# Patient Record
Sex: Male | Born: 1985 | Race: Black or African American | Hispanic: No | Marital: Single | Smoking: Never smoker
Health system: Southern US, Community
[De-identification: ages and names within clinical notes are randomized; demographics above are authoritative.]

## PROBLEM LIST (undated history)

## (undated) DIAGNOSIS — I10 Essential (primary) hypertension: Secondary | ICD-10-CM

## (undated) DIAGNOSIS — F845 Asperger's syndrome: Secondary | ICD-10-CM

## (undated) DIAGNOSIS — E119 Type 2 diabetes mellitus without complications: Secondary | ICD-10-CM

---
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
## 2005-05-19 ENCOUNTER — Emergency Department (HOSPITAL_COMMUNITY): Admission: EM | Admit: 2005-05-19 | Discharge: 2005-05-19 | Payer: Self-pay | Admitting: Family Medicine

## 2008-03-11 ENCOUNTER — Emergency Department (HOSPITAL_COMMUNITY): Admission: EM | Admit: 2008-03-11 | Discharge: 2008-03-11 | Payer: Self-pay | Admitting: Emergency Medicine

## 2012-11-30 ENCOUNTER — Encounter (HOSPITAL_COMMUNITY): Payer: Self-pay | Admitting: Emergency Medicine

## 2012-11-30 ENCOUNTER — Emergency Department (HOSPITAL_COMMUNITY)
Admission: EM | Admit: 2012-11-30 | Discharge: 2012-11-30 | Disposition: A | Discharge status: Home / Self Care | Payer: Self-pay | Attending: Emergency Medicine | Admitting: Emergency Medicine

## 2012-11-30 ENCOUNTER — Emergency Department (HOSPITAL_COMMUNITY): Payer: Self-pay

## 2012-11-30 DIAGNOSIS — W010XXA Fall on same level from slipping, tripping and stumbling without subsequent striking against object, initial encounter: Secondary | ICD-10-CM | POA: Insufficient documentation

## 2012-11-30 DIAGNOSIS — Y929 Unspecified place or not applicable: Secondary | ICD-10-CM | POA: Insufficient documentation

## 2012-11-30 DIAGNOSIS — Y9302 Activity, running: Secondary | ICD-10-CM | POA: Insufficient documentation

## 2012-11-30 DIAGNOSIS — S93409A Sprain of unspecified ligament of unspecified ankle, initial encounter: Secondary | ICD-10-CM | POA: Insufficient documentation

## 2012-11-30 DIAGNOSIS — S93601A Unspecified sprain of right foot, initial encounter: Secondary | ICD-10-CM

## 2012-11-30 MED ORDER — IBUPROFEN 800 MG PO TABS
800.0000 mg | ORAL_TABLET | Freq: Once | ORAL | Status: AC
  Administered 2012-11-30: 800 mg via ORAL
  Filled 2012-11-30: qty 1

## 2012-11-30 MED ORDER — IBUPROFEN 800 MG PO TABS: 800.0000 mg | ORAL_TABLET | Freq: Three times a day (TID) | ORAL | Status: DC | PRN

## 2012-11-30 NOTE — ED Provider Notes (Signed)
CSN: [id number]     Arrival date & time 11/30/12  2010 History  This chart was scribed for non-physician practitioner working with Richardean Canal, MD, by Ardelia Mems ED Scribe. This patient was seen in room WTR9/WTR9 and the patient's care was started at 8:27 PM.   First MD Initiated Contact with Patient 11/30/12 2015     Chief Complaint  Patient presents with  . Foot Pain    The history is provided by the patient. No language interpreter was used.   HPI Comments: Jacob Price is a 27 y.o. male who presents to the Emergency Department complaining of sudden onset, gradually worsening, constant, moderate right ankle pain onset after an accidental fall that occurred yesterday. There is associated swelling to the ankle, but no bruising or obvious deformity. Pt states that he fell in his driveway- he was running from a dog, and accidentally tripped- per triage. He denies any history of prior injury to the ankle. He states that he has not taken any medications to relieve his pain, and he has not tried ice or warm compresses. He states that he is able to walk since the fall. He denies head injury, LOC, vomiting, seizures or any other pain or symptoms related to the fall. He states that he has no chronic medical conditions and takes no daily medications. He denies neck pain, back pain, fever, chills, nausea or any other symptoms. He denies any history of smoking and denies alcohol use.  History reviewed. No pertinent past medical history.  History reviewed. No pertinent past surgical history.  No family history on file.  History  Substance Use Topics  . Smoking status: Never Smoker   . Smokeless tobacco: Not on file  . Alcohol Use: No    Review of Systems  Constitutional: Negative for fever and diaphoresis.  HENT: Negative for neck pain.   Gastrointestinal: Negative for nausea and vomiting.  Musculoskeletal: Negative for back pain.       Right ankle pain.  Neurological: Negative for  seizures, syncope and headaches.   A complete 10 system review of systems was obtained and all systems are negative except as noted in the HPI and PMH.   Allergies  Review of patient's allergies indicates no known allergies.  Home Medications   Current Outpatient Rx  Name  Route  Sig  Dispense  Refill  . ibuprofen (ADVIL,MOTRIN) 800 MG tablet   Oral   Take 1 tablet (800 mg total) by mouth every 8 (eight) hours as needed for pain.   30 tablet   0     Triage Vitals: BP 146/100  Pulse 78  Temp(Src) 98.6 F (37 C) (Oral)  Resp 16  Ht 5\' 10"  (1.778 m)  SpO2 100%  Physical Exam  Nursing note and vitals reviewed. Constitutional: He is oriented to person, place, and time. He appears well-developed and well-nourished.  HENT:  Head: Normocephalic and atraumatic.  Eyes: EOM are normal. Pupils are equal, round, and reactive to light.  Neck: Normal range of motion. Neck supple. No tracheal deviation present.  Cardiovascular: Normal rate, regular rhythm and normal heart sounds.   Pulmonary/Chest: Effort normal and breath sounds normal. No respiratory distress.  Abdominal: Soft. Bowel sounds are normal. There is no tenderness.  Musculoskeletal: He exhibits no tenderness.  Significant swelling to dorsum of foot stopping at the ankle joint , full ROM toes and ankle   Neurological: He is alert and oriented to person, place, and time.  Skin: Skin is warm  and dry. No rash noted.  He has an abrasion over the first and second knuckles on his right hand.  Psychiatric: He has a normal mood and affect.    ED Course   Medications  ibuprofen (ADVIL,MOTRIN) tablet 800 mg (800 mg Oral Given 11/30/12 2145)   Procedures (including critical care time)  DIAGNOSTIC STUDIES: Oxygen Saturation is 100% on RA, normal by my interpretation.    COORDINATION OF CARE: 8:36 PM- Pt examined and advised of plan to await radiology findings.   Labs Reviewed - No data to display  Dg Ankle Complete  Right  11/30/2012   *RADIOLOGY REPORT*  Clinical Data: Fall, right ankle and foot pain  RIGHT ANKLE - COMPLETE 3+ VIEW  Comparison: 03/11/2008  Findings: Diffuse soft tissue swelling.  Right distal tibia, talus, fibula, and calcaneus intact.  IMPRESSION: Soft tissue swelling.  No acute osseous finding   Original Report Authenticated By: Judie Petit. Shick, M.D.   Dg Foot Complete Right  11/30/2012   *RADIOLOGY REPORT*  Clinical Data: Foot pain, injury  RIGHT FOOT COMPLETE - 3+ VIEW  Comparison: 11/30/2012  Findings: Normal alignment without fracture.  Preserved joint spaces.  No soft tissue abnormality.  IMPRESSION: No acute osseous finding   Original Report Authenticated By: Judie Petit. Shick, M.D.    1. Ankle sprain and strain, right, initial encounter   2. Foot sprain, right, initial encounter     MDM   Xray reviewed no fracture will wrap foot an dankle in ACE as patinet is unable to isolate pain sorce due to mental capability        I personally performed the services described in this documentation, which was scribed in my presence. The recorded information has been reviewed and is accurate.   Arman Filter, NP 12/01/12 [phone]

## 2012-11-30 NOTE — ED Notes (Signed)
Patient states that he was running from a dog and tripped hurting his right foot

## 2012-12-01 NOTE — Progress Notes (Signed)
CSW received call from pt mother requesting information about medicaid. CSW directed pt mother and pt to apply at DSS for medicaid and to possibly discuss with financial counselor at [facility] to discuss payment options and further questions about medicaid. .No further Clinical Social Work needs, signing off.   Catha Gosselin, LCSWA  [phone] 12/01/2012.1216pm

## 2012-12-03 NOTE — ED Provider Notes (Signed)
Medical screening examination/treatment/procedure(s) were performed by non-physician practitioner and as supervising physician I was immediately available for consultation/collaboration.   Richardean Canal, MD 12/03/12 [phone]

## 2014-07-29 ENCOUNTER — Encounter (HOSPITAL_COMMUNITY): Payer: Self-pay | Admitting: *Deleted

## 2014-07-29 ENCOUNTER — Emergency Department (HOSPITAL_COMMUNITY)
Admission: EM | Admit: 2014-07-29 | Discharge: 2014-07-29 | Disposition: A | Discharge status: Other Acute Inpatient Hospital | Payer: Self-pay | Attending: Emergency Medicine | Admitting: Emergency Medicine

## 2014-07-29 ENCOUNTER — Inpatient Hospital Stay (HOSPITAL_COMMUNITY)
Admission: AD | Admit: 2014-07-29 | Discharge: 2014-08-03 | DRG: 881 | Disposition: A | Discharge status: Home / Self Care | Payer: Federal, State, Local not specified - Other | Source: Intra-hospital | Attending: Psychiatry | Admitting: Psychiatry

## 2014-07-29 ENCOUNTER — Telehealth (HOSPITAL_COMMUNITY): Payer: Self-pay | Admitting: *Deleted

## 2014-07-29 DIAGNOSIS — F4321 Adjustment disorder with depressed mood: Principal | ICD-10-CM | POA: Diagnosis present

## 2014-07-29 DIAGNOSIS — R45851 Suicidal ideations: Secondary | ICD-10-CM | POA: Diagnosis present

## 2014-07-29 DIAGNOSIS — F4323 Adjustment disorder with mixed anxiety and depressed mood: Secondary | ICD-10-CM | POA: Diagnosis present

## 2014-07-29 DIAGNOSIS — F840 Autistic disorder: Secondary | ICD-10-CM | POA: Diagnosis present

## 2014-07-29 DIAGNOSIS — F411 Generalized anxiety disorder: Secondary | ICD-10-CM | POA: Diagnosis not present

## 2014-07-29 DIAGNOSIS — F42 Obsessive-compulsive disorder: Secondary | ICD-10-CM | POA: Diagnosis present

## 2014-07-29 DIAGNOSIS — F329 Major depressive disorder, single episode, unspecified: Secondary | ICD-10-CM | POA: Diagnosis present

## 2014-07-29 LAB — CBC WITH DIFFERENTIAL/PLATELET
BASOS ABS: 0 10*3/uL (ref 0.0–0.1)
Basophils Relative: 0 % (ref 0–1)
EOS PCT: 1 % (ref 0–5)
Eosinophils Absolute: 0.1 10*3/uL (ref 0.0–0.7)
HEMATOCRIT: 45.4 % (ref 39.0–52.0)
Hemoglobin: 16.2 g/dL (ref 13.0–17.0)
LYMPHS ABS: 3.1 10*3/uL (ref 0.7–4.0)
LYMPHS PCT: 53 % — AB (ref 12–46)
MCH: 29.9 pg (ref 26.0–34.0)
MCHC: 35.7 g/dL (ref 30.0–36.0)
MCV: 83.8 fL (ref 78.0–100.0)
MONO ABS: 0.5 10*3/uL (ref 0.1–1.0)
MONOS PCT: 9 % (ref 3–12)
NEUTROS PCT: 37 % — AB (ref 43–77)
Neutro Abs: 2.1 10*3/uL (ref 1.7–7.7)
Platelets: 273 10*3/uL (ref 150–400)
RBC: 5.42 MIL/uL (ref 4.22–5.81)
RDW: 13.6 % (ref 11.5–15.5)
WBC: 5.8 10*3/uL (ref 4.0–10.5)

## 2014-07-29 LAB — COMPREHENSIVE METABOLIC PANEL
ALK PHOS: 67 U/L (ref 39–117)
ALT: 56 U/L — AB (ref 0–53)
AST: 38 U/L — AB (ref 0–37)
Albumin: 4.4 g/dL (ref 3.5–5.2)
Anion gap: 5 (ref 5–15)
BILIRUBIN TOTAL: 0.8 mg/dL (ref 0.3–1.2)
BUN: 14 mg/dL (ref 6–23)
CHLORIDE: 105 mmol/L (ref 96–112)
CO2: 29 mmol/L (ref 19–32)
CREATININE: 1.08 mg/dL (ref 0.50–1.35)
Calcium: 9.8 mg/dL (ref 8.4–10.5)
GFR calc Af Amer: >90 mL/min (ref >90)
GFR calc non Af Amer: >90 mL/min (ref >90)
GLUCOSE: 98 mg/dL (ref 70–99)
Potassium: 3.6 mmol/L (ref 3.5–5.1)
Sodium: 139 mmol/L (ref 135–145)
TOTAL PROTEIN: 7.7 g/dL (ref 6.0–8.3)

## 2014-07-29 LAB — RAPID URINE DRUG SCREEN, HOSP PERFORMED
Amphetamines: NOT DETECTED
BENZODIAZEPINES: NOT DETECTED
Barbiturates: NOT DETECTED
COCAINE: NOT DETECTED
Opiates: NOT DETECTED
TETRAHYDROCANNABINOL: NOT DETECTED

## 2014-07-29 LAB — URINALYSIS, ROUTINE W REFLEX MICROSCOPIC
BILIRUBIN URINE: NEGATIVE
GLUCOSE, UA: NEGATIVE mg/dL
HGB URINE DIPSTICK: NEGATIVE
Ketones, ur: NEGATIVE mg/dL
Leukocytes, UA: NEGATIVE
NITRITE: NEGATIVE
PH: 6 (ref 5.0–8.0)
PROTEIN: NEGATIVE mg/dL
Specific Gravity, Urine: >1.03 — ABNORMAL HIGH (ref 1.005–1.030)
Urobilinogen, UA: 0.2 mg/dL (ref 0.0–1.0)

## 2014-07-29 LAB — ETHANOL: Alcohol, Ethyl (B): <5 mg/dL (ref 0–9)

## 2014-07-29 LAB — SALICYLATE LEVEL

## 2014-07-29 LAB — ACETAMINOPHEN LEVEL

## 2014-07-29 MED ORDER — ALUM & MAG HYDROXIDE-SIMETH 200-200-20 MG/5ML PO SUSP: 30.0000 mL | ORAL | Status: DC | PRN

## 2014-07-29 MED ORDER — NICOTINE 21 MG/24HR TD PT24
21.0000 mg | MEDICATED_PATCH | Freq: Every day | TRANSDERMAL | Status: DC
  Filled 2014-07-29 (×5): qty 1

## 2014-07-29 MED ORDER — MAGNESIUM HYDROXIDE 400 MG/5ML PO SUSP: 30.0000 mL | Freq: Every day | ORAL | Status: DC | PRN

## 2014-07-29 MED ORDER — TRAZODONE HCL 50 MG PO TABS
50.0000 mg | ORAL_TABLET | Freq: Every day | ORAL | Status: DC
  Filled 2014-07-29 (×4): qty 1
  Filled 2014-07-29: qty 14
  Filled 2014-07-29 (×2): qty 1

## 2014-07-29 MED ORDER — ACETAMINOPHEN 325 MG PO TABS: 650.0000 mg | ORAL_TABLET | Freq: Four times a day (QID) | ORAL | Status: DC | PRN

## 2014-07-29 MED ORDER — SERTRALINE HCL 25 MG PO TABS
25.0000 mg | ORAL_TABLET | Freq: Every day | ORAL | Status: DC
  Administered 2014-07-29 – 2014-08-02 (×5): 25 mg via ORAL
  Filled 2014-07-29 (×6): qty 1

## 2014-07-29 NOTE — ED Notes (Signed)
[facility]. Pt can go after 8 am.

## 2014-07-29 NOTE — ED Notes (Signed)
TTS in progress 

## 2014-07-29 NOTE — ED Provider Notes (Signed)
This chart was scribed for Jacob MawKristen N Keeon Zurn, DO by Bronson CurbJacqueline Melvin, ED Scribe. This patient was seen in room A06C/A06C and the patient's care was started at 2:56 AM.  TIME SEEN: 0256  CHIEF COMPLAINT: Agitation/Suicidal Thoughts  HPI:   HPI Comments: Jacob Price is a 29 y.o. male, with no significant past medical history, brought in via [facility], who presents to the Emergency Department for suicidal thoughts. Per [facility], patient made a comment on a YouTube video and states someone replied to his comment, calling him a "dumbass". [facility] states patient has since become fixated on this comment and believes his name is "tarnished". Family called [facility] for concerned after patient mentioned SI and other concerning remarks regarding the incident. [facility] states the patient was borderline Autistic at 29 years of age, but has no other behavioral diagnoses at this time. Unclear if pt has had any other psych admissions, prior suicide attempts, drug or ETOH use.  Pt denies pain but is so fixated on this you tube video that he can not answer questions including if he has SI, HI, hallucinations.   ROS: Level V caveat for AMS  PAST MEDICAL HISTORY/PAST SURGICAL HISTORY:  History reviewed. No pertinent past medical history.  MEDICATIONS:  Prior to Admission medications   Medication Sig Start Date End Date Taking? Authorizing Provider  ibuprofen (ADVIL,MOTRIN) 800 MG tablet Take 1 tablet (800 mg total) by mouth every 8 (eight) hours as needed for pain. 11/30/12   Earley FavorGail Schulz, NP    ALLERGIES:  No Known Allergies  SOCIAL HISTORY:  History  Substance Use Topics  . Smoking status: Never Smoker   . Smokeless tobacco: Not on file  . Alcohol Use: No    FAMILY HISTORY: No family history on file.  EXAM:  Triage Vitals: BP 130/89 mmHg  Pulse 88  Temp(Src) 98.2 F (36.8 C)  Resp 18  Ht 5\' 11"  (1.803 m)  Wt 160 lb (72.576 kg)  BMI 22.33 kg/m2  SpO2  97%  CONSTITUTIONAL: Alert and will answer some questions but very fixated on you tube video comment and unable to be redirected HEAD: Normocephalic EYES: Conjunctivae clear, PERRL ENT: normal nose; no rhinorrhea; moist mucous membranes; pharynx without lesions noted NECK: Supple, no meningismus, no LAD  CARD: RRR; S1 and S2 appreciated; no murmurs, no clicks, no rubs, no gallops RESP: Normal chest excursion without splinting or tachypnea; breath sounds clear and equal bilaterally; no wheezes, no rhonchi, no rales ABD/GI: Normal bowel sounds; non-distended; soft, non-tender, no rebound, no guarding BACK:  The back appears normal and is non-tender to palpation, there is no CVA tenderness EXT: Normal ROM in all joints; non-tender to palpation; no edema; normal capillary refill; no cyanosis    SKIN: Normal color for age and race; warm NEURO: Moves all extremities equally; normal gait; slightly slow speech PSYCH: Patient has fixation. Unable to be redirected to answer questions appropriately.Marland Kitchen.  MEDICAL DECISION MAKING: Pt here with fixation, possible autism and reportedly telling mother he wanted to die.  Attempted to contact mother Abelardo Dieselrina Caddell at [phone] - left mesage.  [facility] at bedside.  Will get screening labs and urine and d/w TTS.  ED PROGRESS: Labs and UA unremarkable.  [facility] Jama Flavorsobos.    I personally performed the services described in this documentation, which was scribed in my presence. The recorded information has been reviewed and is accurate.      Jacob MawKristen N Colleen Kotlarz, DO 07/29/14 1739

## 2014-07-29 NOTE — Progress Notes (Signed)
Pt given and accepted Zoloft 25 mg. Pt didn't make eye contact. Educated pt on the importance of medication compliance and effects .

## 2014-07-29 NOTE — H&P (Signed)
Psychiatric Admission Assessment Adult  Patient Identification: Jacob Price  MRN:  [id number]  Date of Evaluation:  07/29/2014  Chief Complaint:  ANXIETY DISORDER OCD  Principal Diagnosis: Adjustment disorder with depressed mood  Diagnosis:   Patient Active Problem List   Diagnosis Date Noted  . Adjustment disorder with depressed mood [F43.21] 07/29/2014  . Autism spectrum disorder [F84.0] 07/29/2014   History of Present Illness: Jacob Price is a 29 year old African-American male. Admitted to [facility] from the [facility] ED with complaints of having experienced a negative comment on his You-tube channel and has fixated on it. He made a passive suicidal comment and his parents called the mobile crisis.  Jacob Price reports, Government social research officer for an organization took me to the [employer] I had a You-tube situation with a person that put a derogatory comment on my name. She called me a dumb-ass. That upsets me. I just called whoever I can after that to maintain as mch as I could. That is the biggest thing that is going on with me at the moment. I just want to know what this person perceived me to be. I don't believe that I'm going to be all right with this in my mind. I'm obsessing over this You-tube situation".  O: Jacob Price is seen, other necessary documentation on his name reviewed. Jacob Price appears his stated age. However, seem to have difficulty making eye contacts. Has no facial expressions. He seem to have some basic knowledge about his mental health. He denies any SIHI, AVH, however, able to admit that he has problem with fixations on certain comments and or thoughts. He did state that he was born with a "touch of autism" as a result has the tendency to obsess over situations. When asked [place] if he knows what feeling depressed is, he answered, "Feeling of Loneliness" he says he eats & sleeps well. Other than vitamins, he denies being on any medications.  Elements:  Location:  Adjustment disorder. Quality:   Fixation, excessive worrying, poor concentration. Severity:  Severe. Timing:  Current. Duration:  Symptoms going on x 3 days. Context:  "Patient says, had a You tube siruation whereby some calle him a dumb-ass, got fixated on the name calling".  Associated Signs/Symptoms:  Depression Symptoms:  anxiety, Poor concentration  (Hypo) Manic Symptoms:  Distractibility, Impulsivity,  Anxiety Symptoms:  Excessive Worry,  Psychotic Symptoms:  Paranoia,  PTSD Symptoms: NA  Total Time spent with patient: 1 hour  Past Medical History: History reviewed. No pertinent past medical history. History reviewed. No pertinent past surgical history. Family History: History reviewed. No pertinent family history. Social History:  History  Alcohol Use No     History  Drug Use No    History   Social History  . Marital Status: Married    Spouse Name: N/A  . Number of Children: N/A  . Years of Education: N/A   Social History Main Topics  . Smoking status: Never Smoker   . Smokeless tobacco: Not on file  . Alcohol Use: No  . Drug Use: No  . Sexual Activity: Not Currently   Other Topics Concern  . None   Social History Narrative   Additional Social History:  Pain Medications: none Prescriptions: SEE PTA Over the Counter: denies History of alcohol / drug use?: No history of alcohol / drug abuse Longest period of sobriety (when/how long): NA  Musculoskeletal: Strength & Muscle Tone: within normal limits Gait & Station: normal Patient leans: Backward  Psychiatric Specialty Exam: Physical Exam  Constitutional: He appears well-developed and well-nourished.  HENT:  Head: Normocephalic.  Eyes: Pupils are equal, round, and reactive to light.  Neck: Normal range of motion.  Cardiovascular:  Elevated blood pressure  Respiratory: Effort normal.  GI: Soft.  Genitourinary:  Denies any issues in this areas   Musculoskeletal: Normal range of motion.  Neurological: He is alert.   Skin: Skin is warm and dry.  Psychiatric: His speech is normal. His mood appears anxious. His affect is not angry, not blunt, not labile and not inappropriate. He is slowed and withdrawn. Thought content is paranoid. Cognition and memory are normal. He expresses inappropriate judgment. He exhibits a depressed mood.    Review of Systems  Constitutional: Negative.   HENT: Negative.   Eyes: Negative.   Respiratory: Negative.   Cardiovascular:       Elevated blood pressure  Gastrointestinal: Negative.   Genitourinary: Negative.   Musculoskeletal: Negative.   Skin: Negative.   Neurological: Negative.   Endo/Heme/Allergies: Negative.   Psychiatric/Behavioral: Positive for depression. Negative for suicidal ideas, hallucinations, memory loss and substance abuse. The patient is nervous/anxious. The patient does not have insomnia.     Blood pressure 118/88, pulse 80, temperature 98.4 F (36.9 C), temperature source Oral, resp. rate 18, height 5' 10.67" (1.795 m), weight 119.75 kg (264 lb).Body mass index is 37.17 kg/(m^2).  General Appearance: Bizarre, Casual, Guarded and poor eye contact  Eye Contact::  Poor  Speech:  clear, disorganized  Volume:  Decreased  Mood:  Expressionless  Affect:  Restricted  Thought Process:  Disorganized and Tangential  Orientation:  Other:  Oriented to self  Thought Content:  Obsessions, Rumination and fixation  Suicidal Thoughts:  No  Homicidal Thoughts:  No  Memory:  Immediate;   Fair Recent;   unsure Remote;   unsure,   Judgement:  Impaired  Insight:  Present  Psychomotor Activity:  Decreased  Concentration:  Fair  Recall:  [employer] of Knowledge:Poor  Language: Fair  Akathisia:  No  Handed:  Right  AIMS (if indicated):     Assets:  Desire for Improvement  ADL's:  Intact  Cognition: Fairly intact  Sleep:      Risk to Self: Is patient at risk for suicide?: No  Risk to Others: No  Prior Inpatient Therapy: No  Prior Outpatient Therapy: No    Alcohol Screening: Patient refused Alcohol Screening Tool: Yes 1. How often do you have a drink containing alcohol?: Never 9. Have you or someone else been injured as a result of your drinking?: No 10. Has a relative or friend or a doctor or another health worker been concerned about your drinking or suggested you cut down?: No Alcohol Use Disorder Identification Test Final Score (AUDIT): 0  Allergies:  No Known Allergies Lab Results:  Results for orders placed or performed during the hospital encounter of 07/29/14 (from the past 48 hour(s))  CBC WITH DIFFERENTIAL     Status: Abnormal   Collection Time: 07/29/14  3:13 AM  Result Value Ref Range   WBC 5.8 4.0 - 10.5 K/uL   RBC 5.42 4.22 - 5.81 MIL/uL   Hemoglobin 16.2 13.0 - 17.0 g/dL   HCT 45.4 39.0 - 52.0 %   MCV 83.8 78.0 - 100.0 fL   MCH 29.9 26.0 - 34.0 pg   MCHC 35.7 30.0 - 36.0 g/dL   RDW 13.6 11.5 - 15.5 %   Platelets 273 150 - 400 K/uL   Neutrophils Relative % 37 (L) 43 -  77 %   Neutro Abs 2.1 1.7 - 7.7 K/uL   Lymphocytes Relative 53 (H) 12 - 46 %   Lymphs Abs 3.1 0.7 - 4.0 K/uL   Monocytes Relative 9 3 - 12 %   Monocytes Absolute 0.5 0.1 - 1.0 K/uL   Eosinophils Relative 1 0 - 5 %   Eosinophils Absolute 0.1 0.0 - 0.7 K/uL   Basophils Relative 0 0 - 1 %   Basophils Absolute 0.0 0.0 - 0.1 K/uL  Comprehensive metabolic panel     Status: Abnormal   Collection Time: 07/29/14  3:13 AM  Result Value Ref Range   Sodium 139 135 - 145 mmol/L   Potassium 3.6 3.5 - 5.1 mmol/L   Chloride 105 96 - 112 mmol/L   CO2 29 19 - 32 mmol/L   Glucose, Bld 98 70 - 99 mg/dL   BUN 14 6 - 23 mg/dL   Creatinine, Ser 1.08 0.50 - 1.35 mg/dL   Calcium 9.8 8.4 - 10.5 mg/dL   Total Protein 7.7 6.0 - 8.3 g/dL   Albumin 4.4 3.5 - 5.2 g/dL   AST 38 (H) 0 - 37 U/L   ALT 56 (H) 0 - 53 U/L   Alkaline Phosphatase 67 39 - 117 U/L   Total Bilirubin 0.8 0.3 - 1.2 mg/dL   GFR calc non Af Amer >90 >90 mL/min   GFR calc Af Amer >90 >90 mL/min     Comment: (NOTE) The eGFR has been calculated using the CKD EPI equation. This calculation has not been validated in all clinical situations. eGFR's persistently <90 mL/min signify possible Chronic Kidney Disease.    Anion gap 5 5 - 15  Ethanol     Status: None   Collection Time: 07/29/14  3:13 AM  Result Value Ref Range   Alcohol, Ethyl (B) <5 0 - 9 mg/dL    Comment:        LOWEST DETECTABLE LIMIT FOR SERUM ALCOHOL IS 11 mg/dL FOR MEDICAL PURPOSES ONLY   Acetaminophen level     Status: Abnormal   Collection Time: 07/29/14  3:13 AM  Result Value Ref Range   Acetaminophen (Tylenol), Serum <10.0 (L) 10 - 30 ug/mL    Comment:        THERAPEUTIC CONCENTRATIONS VARY SIGNIFICANTLY. A RANGE OF 10-30 ug/mL MAY BE AN EFFECTIVE CONCENTRATION FOR MANY PATIENTS. HOWEVER, SOME ARE BEST TREATED AT CONCENTRATIONS OUTSIDE THIS RANGE. ACETAMINOPHEN CONCENTRATIONS >150 ug/mL AT 4 HOURS AFTER INGESTION AND >50 ug/mL AT 12 HOURS AFTER INGESTION ARE OFTEN ASSOCIATED WITH TOXIC REACTIONS.   Salicylate level     Status: None   Collection Time: 07/29/14  3:13 AM  Result Value Ref Range   Salicylate Lvl <6.8 2.8 - 20.0 mg/dL  Urinalysis, Routine w reflex microscopic     Status: Abnormal   Collection Time: 07/29/14  6:02 AM  Result Value Ref Range   Color, Urine YELLOW YELLOW   APPearance CLEAR CLEAR   Specific Gravity, Urine >1.030 (H) 1.005 - 1.030   pH 6.0 5.0 - 8.0   Glucose, UA NEGATIVE NEGATIVE mg/dL   Hgb urine dipstick NEGATIVE NEGATIVE   Bilirubin Urine NEGATIVE NEGATIVE   Ketones, ur NEGATIVE NEGATIVE mg/dL   Protein, ur NEGATIVE NEGATIVE mg/dL   Urobilinogen, UA 0.2 0.0 - 1.0 mg/dL   Nitrite NEGATIVE NEGATIVE   Leukocytes, UA NEGATIVE NEGATIVE    Comment: MICROSCOPIC NOT DONE ON URINES WITH NEGATIVE PROTEIN, BLOOD, LEUKOCYTES, NITRITE, OR GLUCOSE <1000 mg/dL.  Drug screen  panel, emergency     Status: None   Collection Time: 07/29/14  6:03 AM  Result Value Ref Range   Opiates  NONE DETECTED NONE DETECTED   Cocaine NONE DETECTED NONE DETECTED   Benzodiazepines NONE DETECTED NONE DETECTED   Amphetamines NONE DETECTED NONE DETECTED   Tetrahydrocannabinol NONE DETECTED NONE DETECTED   Barbiturates NONE DETECTED NONE DETECTED    Comment:        DRUG SCREEN FOR MEDICAL PURPOSES ONLY.  IF CONFIRMATION IS NEEDED FOR ANY PURPOSE, NOTIFY LAB WITHIN 5 DAYS.        LOWEST DETECTABLE LIMITS FOR URINE DRUG SCREEN Drug Class       Cutoff (ng/mL) Amphetamine      1000 Barbiturate      200 Benzodiazepine   056 Tricyclics       979 Opiates          300 Cocaine          300 THC              50    Current Medications: Current Facility-Administered Medications  Medication Dose Route Frequency Provider Last Rate Last Dose  . acetaminophen (TYLENOL) tablet 650 mg  650 mg Oral Q6H PRN Encarnacion Slates, NP      . alum & mag hydroxide-simeth (MAALOX/MYLANTA) 200-200-20 MG/5ML suspension 30 mL  30 mL Oral Q4H PRN Encarnacion Slates, NP      . magnesium hydroxide (MILK OF MAGNESIA) suspension 30 mL  30 mL Oral Daily PRN Encarnacion Slates, NP      . nicotine (NICODERM CQ - dosed in mg/24 hours) patch 21 mg  21 mg Transdermal Q0600 Encarnacion Slates, NP   21 mg at 07/29/14 1216  . traZODone (DESYREL) tablet 50 mg  50 mg Oral QHS Encarnacion Slates, NP       PTA Medications: Prescriptions prior to admission  Medication Sig Dispense Refill Last Dose  . ibuprofen (ADVIL,MOTRIN) 800 MG tablet Take 1 tablet (800 mg total) by mouth every 8 (eight) hours as needed for pain. 30 tablet 0    Previous Psychotropic Medications: No   Substance Abuse History in the last 12 months:  No.  Consequences of Substance Abuse: Medical Consequences:  Liver damage, Possible death by overdose Legal Consequences:  Arrests, jail time, Loss of driving privilege. Family Consequences:  Family discord, divorce and or separation.  Results for orders placed or performed during the hospital encounter of 07/29/14 (from the  past 72 hour(s))  CBC WITH DIFFERENTIAL     Status: Abnormal   Collection Time: 07/29/14  3:13 AM  Result Value Ref Range   WBC 5.8 4.0 - 10.5 K/uL   RBC 5.42 4.22 - 5.81 MIL/uL   Hemoglobin 16.2 13.0 - 17.0 g/dL   HCT 45.4 39.0 - 52.0 %   MCV 83.8 78.0 - 100.0 fL   MCH 29.9 26.0 - 34.0 pg   MCHC 35.7 30.0 - 36.0 g/dL   RDW 13.6 11.5 - 15.5 %   Platelets 273 150 - 400 K/uL   Neutrophils Relative % 37 (L) 43 - 77 %   Neutro Abs 2.1 1.7 - 7.7 K/uL   Lymphocytes Relative 53 (H) 12 - 46 %   Lymphs Abs 3.1 0.7 - 4.0 K/uL   Monocytes Relative 9 3 - 12 %   Monocytes Absolute 0.5 0.1 - 1.0 K/uL   Eosinophils Relative 1 0 - 5 %   Eosinophils Absolute 0.1 0.0 - 0.7  K/uL   Basophils Relative 0 0 - 1 %   Basophils Absolute 0.0 0.0 - 0.1 K/uL  Comprehensive metabolic panel     Status: Abnormal   Collection Time: 07/29/14  3:13 AM  Result Value Ref Range   Sodium 139 135 - 145 mmol/L   Potassium 3.6 3.5 - 5.1 mmol/L   Chloride 105 96 - 112 mmol/L   CO2 29 19 - 32 mmol/L   Glucose, Bld 98 70 - 99 mg/dL   BUN 14 6 - 23 mg/dL   Creatinine, Ser 1.08 0.50 - 1.35 mg/dL   Calcium 9.8 8.4 - 10.5 mg/dL   Total Protein 7.7 6.0 - 8.3 g/dL   Albumin 4.4 3.5 - 5.2 g/dL   AST 38 (H) 0 - 37 U/L   ALT 56 (H) 0 - 53 U/L   Alkaline Phosphatase 67 39 - 117 U/L   Total Bilirubin 0.8 0.3 - 1.2 mg/dL   GFR calc non Af Amer >90 >90 mL/min   GFR calc Af Amer >90 >90 mL/min    Comment: (NOTE) The eGFR has been calculated using the CKD EPI equation. This calculation has not been validated in all clinical situations. eGFR's persistently <90 mL/min signify possible Chronic Kidney Disease.    Anion gap 5 5 - 15  Ethanol     Status: None   Collection Time: 07/29/14  3:13 AM  Result Value Ref Range   Alcohol, Ethyl (B) <5 0 - 9 mg/dL    Comment:        LOWEST DETECTABLE LIMIT FOR SERUM ALCOHOL IS 11 mg/dL FOR MEDICAL PURPOSES ONLY   Acetaminophen level     Status: Abnormal   Collection Time: 07/29/14   3:13 AM  Result Value Ref Range   Acetaminophen (Tylenol), Serum <10.0 (L) 10 - 30 ug/mL    Comment:        THERAPEUTIC CONCENTRATIONS VARY SIGNIFICANTLY. A RANGE OF 10-30 ug/mL MAY BE AN EFFECTIVE CONCENTRATION FOR MANY PATIENTS. HOWEVER, SOME ARE BEST TREATED AT CONCENTRATIONS OUTSIDE THIS RANGE. ACETAMINOPHEN CONCENTRATIONS >150 ug/mL AT 4 HOURS AFTER INGESTION AND >50 ug/mL AT 12 HOURS AFTER INGESTION ARE OFTEN ASSOCIATED WITH TOXIC REACTIONS.   Salicylate level     Status: None   Collection Time: 07/29/14  3:13 AM  Result Value Ref Range   Salicylate Lvl <1.2 2.8 - 20.0 mg/dL  Urinalysis, Routine w reflex microscopic     Status: Abnormal   Collection Time: 07/29/14  6:02 AM  Result Value Ref Range   Color, Urine YELLOW YELLOW   APPearance CLEAR CLEAR   Specific Gravity, Urine >1.030 (H) 1.005 - 1.030   pH 6.0 5.0 - 8.0   Glucose, UA NEGATIVE NEGATIVE mg/dL   Hgb urine dipstick NEGATIVE NEGATIVE   Bilirubin Urine NEGATIVE NEGATIVE   Ketones, ur NEGATIVE NEGATIVE mg/dL   Protein, ur NEGATIVE NEGATIVE mg/dL   Urobilinogen, UA 0.2 0.0 - 1.0 mg/dL   Nitrite NEGATIVE NEGATIVE   Leukocytes, UA NEGATIVE NEGATIVE    Comment: MICROSCOPIC NOT DONE ON URINES WITH NEGATIVE PROTEIN, BLOOD, LEUKOCYTES, NITRITE, OR GLUCOSE <1000 mg/dL.  Drug screen panel, emergency     Status: None   Collection Time: 07/29/14  6:03 AM  Result Value Ref Range   Opiates NONE DETECTED NONE DETECTED   Cocaine NONE DETECTED NONE DETECTED   Benzodiazepines NONE DETECTED NONE DETECTED   Amphetamines NONE DETECTED NONE DETECTED   Tetrahydrocannabinol NONE DETECTED NONE DETECTED   Barbiturates NONE DETECTED NONE DETECTED  Comment:        DRUG SCREEN FOR MEDICAL PURPOSES ONLY.  IF CONFIRMATION IS NEEDED FOR ANY PURPOSE, NOTIFY LAB WITHIN 5 DAYS.        LOWEST DETECTABLE LIMITS FOR URINE DRUG SCREEN Drug Class       Cutoff (ng/mL) Amphetamine      1000 Barbiturate      200 Benzodiazepine    815 Tricyclics       947 Opiates          300 Cocaine          300 THC              50     Observation Level/Precautions:  15 minute checks  Laboratory:  Per ED  Psychotherapy: Group sessions    Medications: See medication lists  Consultations: As needed   Discharge Concerns: Mood stability  Estimated LOS:  5-7 days  Other:     Psychological Evaluations: No   Treatment Plan Summary: Daily contact with patient to assess and evaluate symptoms and progress in treatment and Medication management: Treatment Plan/Recommendations: 1. Admit for crisis management and stabilization, estimated length of stay 3-5 days.  2. Medication management to reduce current symptoms to base line and improve the patient's overall level of functioning: Initiate Sertraline 25 mg daily for depression.  3. Treat health problems as indicated.  4. Develop treatment plan to decrease risk of relapse upon discharge and the need for readmission.  5. Psycho-social education regarding relapse prevention and self care.  6. Health care follow up as needed for medical problems.  7. Review, reconcile, and reinstate any pertinent home medications for other health issues where appropriate. 8. Call for consults with hospitalist for any additional specialty patient care services as needed.  Medical Decision Making:  New problem, with additional work up planned, Review of Psycho-Social Stressors (1), Review and summation of old records (2), Review or order medicine tests (1), Review of Medication Regimen & Side Effects (2) and Review of New Medication or Change in Dosage (2)  I certify that inpatient services furnished can reasonably be expected to improve the patient's condition.   Encarnacion Slates, PMHNP-BC 4/2/20163:37 PM

## 2014-07-29 NOTE — BH Assessment (Addendum)
Tele Assessment Note   Jacob Price is an 29 y.o. male. BIB mobile crisis who were contacted by concerned family members. Pt had a conflict with a person on YouTube and they exchanged what they pt called, negative comments. He reports she was making "Deragatory darts at him" and he was using negative descriptive language towards her, but not profanity or threats. He reports he engaged in conflict with this person because she we was calling people dumb for believing a video that declare the Gelene Mink Nay dance was Satanic and he was trying to refute her. Because of the back and forth exchange pt was blocked from making additional comments. He would like to be able to make amends with this person and cannot. Since this happened yesterday he has been fixated on it and is very upset. He reports he knows some people would take this situation lightly but "I understand the magnitude of the situation." He had trouble elaborating on this. He then made a comment to his sister today that he feels his family would have been better off "If another son had been born in his place." He reports because of this event he has been "numbed to stuff." He repeatedly stated he has to obsess about this situation and worry about how this other person, a stranger from online, is perceiving him. Pt reports other stressor is he does not feel his current living situation, living alone in an apartment is working out for him. He reports he would prefer to live with family and feels "It is useless to be stuck living by myself." He reports he spends much of his time pacing around the apartment and online, specifically on You Tube.  Pt reports he previously lived with his grandmother but he could not get along with the man she was living with "so I had to strike out on my own and get an apartment." Pt reports he has been out of work since 2008 and that relatives support him.   At the time of assessment pt was alert and oriented times 4 but  extremely distracted and preoccupied with the You Tube situation. Speech was slow, and pt had a stutter at times. He would frequently close his eyes and look or fail to make eye contact. He appeared to be experiencing thought blocking and had extreme difficulty with providing answers. It is not known if this is typical for pt or a result of current preoccupation. Pt reports passive thoughts of not wanting to be born but denies plan or intent. He specifically noted that he is too distracted to engage in self harm. Pt reports at times he slaps himself in the face. Pt denied HI, stating he is much more the type of person who would harm himself than others. He denies AVH, denies SA, and HI.   Pt reports he has many things to be depressed about. He reports he does not like living alone, has loss of pleasure and motivation at times, and is irritable at times. He denies sx of mania. He has never been dx with depression previously.   Pt denies hx of worry or obsessions prior to this current situation. He denies hx of abuse, but noted he broke his arm when he was a child jumping off a porch "Doing a stunt" which he found traumatic." Pt reports he has had a conflict on You Tube before but was able to make amends and work it out with pt. He denies sx of PTSD, or phobias. He  reports he feels like he makes friends easily and reports multiple family members as supports. Pt lives alone and reports he does all of his ADLs but currently worries he will be unable to care for himself due to level of distraction. Pt graduated high school and has never had OP or inpt treatment. 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 He noted for someone with Autism he has a good IQ, but did not know what it was. He reports "I comprehend some things very well." Pt gave consent for this writer to contact his mother for more information and planning purposes as he stated he does not wish  to go home.   Spoke with mother Jacob Price who reports pt was dx as borderline autistic at age 88 but nothing was said about it afterwards. He did not have an IEP at school. She reports he has been unable to maintain work. 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 After pt was transferred that area was closed and he has been out of work since 2008. Mom believes he could benefit from an advocate or job coach, but he currently does not have a dx, and has been turned down for disability multiple times. Mom reports family was worried he would hurt himself but notes he has never made suicidal threats or gestures in the past. She noted he slaps himself in the face sometimes. Mom said she was not certain pt could stay with family or have family stay with him if he was discharged. Spoke with mom about getting pt tested to determine what supports he may qualify for.   Family hx is negative for MH, SA, and suicide.   Pt does not wish to return home. He does not think he will act on any thoughts to hurt himself and has no plan, however, he does not feel he can care for himself in his current distracted state.   Axis I: 300.00 Unspecified anxiety disorder rule out OCD 311 Depressive Disorder Unspecified  Rule out 299.00 Autism Spectrum Disorder  Axis II: Deferred Axis III: History reviewed. No pertinent past medical history. Axis IV: occupational problems, other psychosocial or environmental problems and problems with access to health care services Axis V: 41-50 serious symptoms  Past Medical History: History reviewed. No pertinent past medical history.  History reviewed. No pertinent past surgical history.  Family History: No family history on file.  Social History:  reports that he has never smoked. He does not have any smokeless tobacco history on file. He reports that he  does not drink alcohol or use illicit drugs.  Additional Social History:  Alcohol / Drug Use Pain Medications: none Prescriptions: SEE PTA Over the Counter: denies History of alcohol / drug use?: No history of alcohol / drug abuse Longest period of sobriety (when/how long): NA Negative Consequences of Use:  (NA) Withdrawal Symptoms:  (NA)  CIWA: CIWA-Ar BP: 130/89 mmHg Pulse Rate: 88 COWS:    PATIENT STRENGTHS: (choose at least two) Capable of independent living Communication skills  Allergies: No Known Allergies  Home Medications:  (Not in a hospital admission)  OB/GYN Status:  No LMP for male patient.  General Assessment Data Location of Assessment: [facility] ED Is this a Tele or Face-to-Face Assessment?: Tele  Assessment Is this an Initial Assessment or a Re-assessment for this encounter?: Initial Assessment Living Arrangements: Alone Can pt return to current living arrangement?: Yes Admission Status: Voluntary Is patient capable of signing voluntary admission?: Yes Transfer from: Home Referral Source: Self/Family/Friend     [facility] Crisis Care Plan Living Arrangements: Alone Name of Psychiatrist: none Name of Therapist: none  Education Status Is patient currently in school?: No Current Grade: NA Highest grade of school patient has completed: 12 Name of school: [employer] person: NA  Risk to self with the past 6 months Suicidal Ideation: No Suicidal Intent: No Is patient at risk for suicide?: No Suicidal Plan?: No Access to Means: No What has been your use of drugs/alcohol within the last 12 months?: none Previous Attempts/Gestures: No How many times?: 0 Other Self Harm Risks: none Triggers for Past Attempts: None known Intentional Self Injurious Behavior:  (slaps himself in the face at times) Family Suicide History: No Recent stressful life event(s): Conflict (Comment) Persecutory voices/beliefs?: No Depression: Yes Depression Symptoms:  Despondent, Guilt, Feeling worthless/self pity (lonliness ) Substance abuse history and/or treatment for substance abuse?: No Suicide prevention information given to non-admitted patients: Yes  Risk to Others within the past 6 months Homicidal Ideation: No Thoughts of Harm to Others: No Current Homicidal Intent: No Current Homicidal Plan: No Access to Homicidal Means: No Identified Victim: none History of harm to others?: No Assessment of Violence: None Noted Violent Behavior Description: none Does patient have access to weapons?: No Criminal Charges Pending?: No Does patient have a court date: No  Psychosis Hallucinations: None noted Delusions: None noted  Mental Status Report Appearance/Hygiene: Unremarkable Eye Contact: Poor Motor Activity: Other (Comment) (holdig gown closed, looking off in other direction) Speech: Soft (repetitive, stutter at times) Level of Consciousness: Alert Mood: Depressed, Anxious, Preoccupied Affect:  (consistent with mood and thought content) Anxiety Level: Severe Thought Processes: Circumstantial, Thought Blocking Judgement: Partial Orientation: Person, Time, Place, Situation Obsessive Compulsive Thoughts/Behaviors: Moderate  Cognitive Functioning Concentration: Decreased Memory: Recent Intact, Remote Intact IQ: Average Insight: Poor Impulse Control: Fair Appetite: Good Weight Loss:  (reports he thinks he has been loosing weight) Weight Gain: 0 Sleep: No Change Total Hours of Sleep: 8 Vegetative Symptoms: Decreased grooming  ADLScreening [facility] Assessment Services) Patient's cognitive ability adequate to safely complete daily activities?: Yes (questionable at this time due to current fixation) Patient able to express need for assistance with ADLs?: Yes Independently performs ADLs?: Yes (appropriate for developmental age)  Prior Inpatient Therapy Prior Inpatient Therapy: No Prior Therapy Dates: NA Prior Therapy Facilty/Provider(s):  NA Reason for Treatment: NA  Prior Outpatient Therapy Prior Outpatient Therapy: No Prior Therapy Dates: NA Prior Therapy Facilty/Provider(s): NA Reason for Treatment: NA  ADL Screening (condition at time of admission) Patient's cognitive ability adequate to safely complete daily activities?: Yes (questionable at this time due to current fixation) Is the patient deaf or have difficulty hearing?: No Does the patient have difficulty seeing, even when wearing glasses/contacts?: No Does the patient have difficulty concentrating, remembering, or making decisions?: Yes Patient able to express need for assistance with ADLs?: Yes Does the patient have difficulty dressing or bathing?: No Independently performs ADLs?: Yes (appropriate for developmental age) Does the patient have difficulty walking or climbing stairs?: No Weakness of Legs: None Weakness of Arms/Hands: None  Home Assistive Devices/Equipment Home Assistive Devices/Equipment: None    Abuse/Neglect Assessment (Assessment to be complete while patient is alone) Physical Abuse: Denies Verbal Abuse: Denies Sexual Abuse:  Denies Exploitation of patient/patient's resources: Denies Self-Neglect: Denies Values / Beliefs Cultural Requests During Hospitalization: None ("messianic monotheist) Spiritual Requests During Hospitalization: None (reports he does not eat pork)   Advance Directives (For Healthcare) Does patient have an advance directive?: No Would patient like information on creating an advanced directive?: No - patient declined information    Additional Information 1:1 In Past 12 Months?: No CIRT Risk: No Elopement Risk: No Does patient have medical clearance?: No     Disposition:  Per Hulan FessIjeoma Nwaeze, NP pt meets inpt criteria and [facility]. Per Binnie RailJoann Glover, [facility] in room [facility]2 under the care of Dr. Jama Flavorsobos to arrive after 8 am. Informed Dr. Elesa MassedWard of acceptance and she is in  agreement. RN was informed and will complete support paperwork with pt.  Disposition Initial Assessment Completed for this Encounter: Yes  Emelina Hinch M 07/29/2014 4:34 AM

## 2014-07-29 NOTE — ED Notes (Signed)
hes upset over a u-tube video using his name.  He has been thiunking about it constantly and cannot stand to play video games

## 2014-07-29 NOTE — Progress Notes (Signed)
CSW attempted to meet with pt @ 12:15 to complete initial assessment, however pt was sleeping.  CSW will return later this afternoon to attempt again to complete assessment.   Fleet ContrasRachel (weekend coverage)

## 2014-07-29 NOTE — Progress Notes (Signed)
Admit Note : 29 y/o black male [facility] After mobile crisis p/u pt for bizarre behavior. Pt reports being upset after watching a u tube, and felt the " Nay, Nay " was satanic in nature causing him to feel numb and frozen . Pt has his eyes closed mumbling to self, actively responding to internal stimuli. " I have been getting messages from the T.V ". Oriented to the unit, Education provided about safety on the unit, including fall prevention Pt does admit to being dx with Autism since age 89. Nutrition offered, safety checks initiated every 15 minutes. Search completed.

## 2014-07-29 NOTE — Tx Team (Signed)
Initial Interdisciplinary Treatment Plan   PATIENT STRESSORS: Educational concerns   PATIENT STRENGTHS: Capable of independent living Motivation for treatment/growth Supportive family/friends   PROBLEM LIST: Problem List/Patient Goals Date to be addressed Date deferred Reason deferred Estimated date of resolution  Auditory Hall  07/29/2014   08/03/2014  Visual Hall 07/29/2014   08/03/2014                                             DISCHARGE CRITERIA:  Ability to meet basic life and health needs Improved stabilization in mood, thinking, and/or behavior  PRELIMINARY DISCHARGE PLAN: Return to previous living arrangement  PATIENT/FAMIILY INVOLVEMENT: This treatment plan has been presented to and reviewed with the patient, Jacob Price, and/or family member, [name].  The patient and family have been given the opportunity to ask questions and make suggestions.  Jimmey Ralpherez, Shantella Blubaugh M 07/29/2014, 10:40 AM

## 2014-07-29 NOTE — BH Assessment (Addendum)
Spoke with Dr. Elesa MassedWard prior to initiating assessment. She reports pt had a negative comment on his Youtube channel and has fixated on it. He made a passive suicidal comment and his parents called mobile crisis. Mobile crisis reported no prior knowledge of pt. Mtr reported borderline autistic age at 89, but no other mental health information known. Pt is not answering any questions as he keeps fixating on comment.   Requested cart be placed with pt for assessment.   Assessment to commence shortly.   First attempt at 0325 pt was not in his room, having left to go to the bathroom. Per ED staff they will call back when pt is [facility] desktop two or [phone].   Clista BernhardtNancy Brooklyne Radke, [facility] Triage Specialist 07/29/2014 3:18 AM

## 2014-07-29 NOTE — [facility] Suicide Risk Assessment (Signed)
[facility] Admission Suicide Risk Assessment   Nursing information obtained from:    Demographic factors:    Current Mental Status:    Loss Factors:    Historical Factors:    Risk Reduction Factors:    Total Time spent with patient: 30 minutes Principal Problem: Adjustment disorder with depressed mood Diagnosis:   Patient Active Problem List   Diagnosis Date Noted  . Adjustment disorder with depressed mood [F43.21] 07/29/2014  . Autism spectrum disorder [F84.0] 07/29/2014     Continued Clinical Symptoms:  Alcohol Use Disorder Identification Test Final Score (AUDIT): 0 The "Alcohol Use Disorders Identification Test", Guidelines for Use in Primary Care, Second Edition.  World Science writer [facility]). Score between 0-7:  no or low risk or alcohol related problems. Score between 8-15:  moderate risk of alcohol related problems. Score between 16-19:  high risk of alcohol related problems. Score 20 or above:  warrants further diagnostic evaluation for alcohol dependence and treatment.   CLINICAL FACTORS:   Depression:   Hopelessness   Musculoskeletal: Strength & Muscle Tone: within normal limits Gait & Station: normal Patient leans: N/A  Psychiatric Specialty Exam: Physical Exam  Review of Systems  Constitutional: Negative.   HENT: Negative.   Eyes: Negative.   Respiratory: Negative.   Cardiovascular: Negative.   Gastrointestinal: Negative.   Genitourinary: Negative.   Musculoskeletal: Negative.   Skin: Negative.   Neurological: Negative.   Psychiatric/Behavioral: Positive for depression. The patient is nervous/anxious.     Blood pressure 118/88, pulse 80, temperature 98.4 F (36.9 C), temperature source Oral, resp. rate 18, height 5' 10.67" (1.795 m), weight 119.75 kg (264 lb).Body mass index is 37.17 kg/(m^2).  General Appearance: Casual  Eye Contact::  Poor  Speech:  Slow and has stuttering  Volume:  Decreased  Mood:  Anxious  Affect:  Congruent  Thought Process:   Coherent  Orientation:  Full (Time, Place, and Person)  Thought Content:  Obsessions and Rumination  Suicidal Thoughts:  No  Homicidal Thoughts:  No  Memory:  Immediate;   Fair Recent;   Fair Remote;   Fair  Judgement:  Impaired  Insight:  Lacking  Psychomotor Activity:  Decreased  Concentration:  Poor  Recall:  [employer] of Knowledge:Fair  Language: Fair  Akathisia:  No  Handed:  Right  AIMS (if indicated):     Assets:  Physical Health  Sleep:     Cognition: WNL  ADL's:  Intact     COGNITIVE FEATURES THAT CONTRIBUTE TO RISK:  Polarized thinking    SUICIDE RISK:   Mild:  Suicidal ideation of limited frequency, intensity, duration, and specificity.  There are no identifiable plans, no associated intent, mild dysphoria and related symptoms, good self-control (both objective and subjective assessment), few other risk factors, and identifiable protective factors, including available and accessible social support.  PLAN OF CARE: Patient will benefit from inpatient treatment and stabilization.  Estimated length of stay is 5-7 days.  Reviewed past medical records,treatment plan.  Will start a trial of Zoloft 25 mg po daily for anxiety/depression/obsessions. Will continue to monitor vitals ,medication compliance and treatment side effects while patient is here.  Will monitor for medical issues as well as call consult as needed.  Reviewed labs ,will order as needed.  CSW will start working on disposition.  Patient to participate in therapeutic milieu .       Medical Decision Making:  Review of Psycho-Social Stressors (1), Review or order clinical lab tests (1), Review and summation  of old records (2), Review of Last Therapy Session (1), Review of Medication Regimen & Side Effects (2) and Review of New Medication or Change in Dosage (2)  I certify that inpatient services furnished can reasonably be expected to improve the patient's condition.   Isaish Alemu MD 07/29/2014,  3:20 PM

## 2014-07-29 NOTE — [facility] Group Notes (Signed)
[facility] Group Notes: (Clinical Social Work)  07/29/2014 10-11AM  Summary of Progress/Problems: The main focus of today's process group was to learn how to use a decisional balance exercise to analyze a self-identified current unhealthy coping skill and work toward making a decision as to whether to modify the current behavior. Motivational Interviewing and a worksheet were utilized to help patients explore in depth the perceived benefits and costs of the self-sabotaging behavior, as well as the benefits and costs of replacing that with a healthy coping mechanism. The patient expressed that he gets obsessed, needs to get out of his own living space and be around other people.  Each time in group that he spoke, it was with the same information.  Type of Therapy: Group Therapy - Process   Participation Level: Active  Participation Quality: Attentive, Sharing   Affect: Flat and Depressed  Cognitive: Disorganized, Confused  Insight: Developing/Improving  Engagement in Therapy: Engaged  Modes of Intervention: Education, Motiational Interviewing  Jacob MantleMareida Grossman-Orr, LCSW 07/29/2014, 12:50 PM

## 2014-07-30 MED ORDER — BUSPIRONE HCL 5 MG PO TABS
5.0000 mg | ORAL_TABLET | Freq: Three times a day (TID) | ORAL | Status: DC
  Administered 2014-07-30 – 2014-08-01 (×7): 5 mg via ORAL
  Filled 2014-07-30 (×12): qty 1

## 2014-07-30 NOTE — Progress Notes (Signed)
Pt resting in bed, eyes closed, breathing even and unlabored. No signs of distress noted. Q15 min safety checks maintained. Pt remains safe on the unit. Will continue to monitor.  

## 2014-07-30 NOTE — Progress Notes (Signed)
[facility] MD Progress Note  07/30/2014 2:52 PM Jacob Price  MRN:  [id number] Subjective: Patient states " I am still having negative thoughts .'  Objective : Patient seen and chart reviewed.Discussed patient with treatment team. Pt continues to be withdrawn , has limited interaction with peers , has minimal eye contact , reports having a 'touch of autism' per patient. Patient continues to have negative thoughts , reports he ruminates on them and sometimes it is difficult to get it out of his head.  Pt otherwise denies any other complaints. Pt denies SI/HI/AH/VH. Pt denies ADRs of medications. Pt encouraged at attend groups.   Principal Problem: Adjustment disorder with depressed mood R/O MDD ,R/O GAD Diagnosis:   Patient Active Problem List   Diagnosis Date Noted  . Adjustment disorder with depressed mood [F43.21] 07/29/2014  . Autism spectrum disorder [F84.0] 07/29/2014   Total Time spent with patient: 30 minutes   Past Medical History: History reviewed. No pertinent past medical history. History reviewed. No pertinent past surgical history. Family History: History reviewed. No pertinent family history. Social History:  History  Alcohol Use No     History  Drug Use No    History   Social History  . Marital Status: Married    Spouse Name: N/A  . Number of Children: N/A  . Years of Education: N/A   Social History Main Topics  . Smoking status: Never Smoker   . Smokeless tobacco: Not on file  . Alcohol Use: No  . Drug Use: No  . Sexual Activity: Not Currently   Other Topics Concern  . None   Social History Narrative   Additional History:    Sleep: Fair  Appetite:  Fair      Musculoskeletal: Strength & Muscle Tone: within normal limits Gait & Station: normal Patient leans: N/A   Psychiatric Specialty Exam: Physical Exam  Review of Systems  Psychiatric/Behavioral: The patient is nervous/anxious.     Blood pressure 120/62, pulse 94, temperature 98.3 F  (36.8 C), temperature source Oral, resp. rate 18, height 5' 10.67" (1.795 m), weight 119.75 kg (264 lb).Body mass index is 37.17 kg/(m^2).  General Appearance: Casual  Eye Contact::  Minimal  Speech:  Slow and has stuttering  Volume:  Decreased  Mood:  Anxious  Affect:  Flat  Thought Process:  Coherent  Orientation:  Full (Time, Place, and Person)  Thought Content:  Rumination  Suicidal Thoughts:  No  Homicidal Thoughts:  No  Memory:  Immediate;   Fair Recent;   Fair Remote;   Fair  Judgement:  Impaired  Insight:  Lacking  Psychomotor Activity:  Normal  Concentration:  Poor  Recall:  [employer] of Knowledge:Fair  Language: Fair  Akathisia:  No  Handed:  Right  AIMS (if indicated):     Assets:  Desire for Improvement  ADL's:  Intact  Cognition: WNL  Sleep:  Number of Hours: 6.75     Current Medications: Current Facility-Administered Medications  Medication Dose Route Frequency Provider Last Rate Last Dose  . acetaminophen (TYLENOL) tablet 650 mg  650 mg Oral Q6H PRN Encarnacion Slates, NP      . alum & mag hydroxide-simeth (MAALOX/MYLANTA) 200-200-20 MG/5ML suspension 30 mL  30 mL Oral Q4H PRN Encarnacion Slates, NP      . busPIRone (BUSPAR) tablet 5 mg  5 mg Oral TID Ursula Alert, MD   5 mg at 07/30/14 1252  . magnesium hydroxide (MILK OF MAGNESIA) suspension 30 mL  30  mL Oral Daily PRN Encarnacion Slates, NP      . nicotine (NICODERM CQ - dosed in mg/24 hours) patch 21 mg  21 mg Transdermal Q0600 Encarnacion Slates, NP   21 mg at 07/29/14 1216  . sertraline (ZOLOFT) tablet 25 mg  25 mg Oral Daily Encarnacion Slates, NP   25 mg at 07/30/14 0800  . traZODone (DESYREL) tablet 50 mg  50 mg Oral QHS Encarnacion Slates, NP   50 mg at 07/29/14 2200    Lab Results:  Results for orders placed or performed during the hospital encounter of 07/29/14 (from the past 48 hour(s))  CBC WITH DIFFERENTIAL     Status: Abnormal   Collection Time: 07/29/14  3:13 AM  Result Value Ref Range   WBC 5.8 4.0 - 10.5 K/uL    RBC 5.42 4.22 - 5.81 MIL/uL   Hemoglobin 16.2 13.0 - 17.0 g/dL   HCT 45.4 39.0 - 52.0 %   MCV 83.8 78.0 - 100.0 fL   MCH 29.9 26.0 - 34.0 pg   MCHC 35.7 30.0 - 36.0 g/dL   RDW 13.6 11.5 - 15.5 %   Platelets 273 150 - 400 K/uL   Neutrophils Relative % 37 (L) 43 - 77 %   Neutro Abs 2.1 1.7 - 7.7 K/uL   Lymphocytes Relative 53 (H) 12 - 46 %   Lymphs Abs 3.1 0.7 - 4.0 K/uL   Monocytes Relative 9 3 - 12 %   Monocytes Absolute 0.5 0.1 - 1.0 K/uL   Eosinophils Relative 1 0 - 5 %   Eosinophils Absolute 0.1 0.0 - 0.7 K/uL   Basophils Relative 0 0 - 1 %   Basophils Absolute 0.0 0.0 - 0.1 K/uL  Comprehensive metabolic panel     Status: Abnormal   Collection Time: 07/29/14  3:13 AM  Result Value Ref Range   Sodium 139 135 - 145 mmol/L   Potassium 3.6 3.5 - 5.1 mmol/L   Chloride 105 96 - 112 mmol/L   CO2 29 19 - 32 mmol/L   Glucose, Bld 98 70 - 99 mg/dL   BUN 14 6 - 23 mg/dL   Creatinine, Ser 1.08 0.50 - 1.35 mg/dL   Calcium 9.8 8.4 - 10.5 mg/dL   Total Protein 7.7 6.0 - 8.3 g/dL   Albumin 4.4 3.5 - 5.2 g/dL   AST 38 (H) 0 - 37 U/L   ALT 56 (H) 0 - 53 U/L   Alkaline Phosphatase 67 39 - 117 U/L   Total Bilirubin 0.8 0.3 - 1.2 mg/dL   GFR calc non Af Amer >90 >90 mL/min   GFR calc Af Amer >90 >90 mL/min    Comment: (NOTE) The eGFR has been calculated using the CKD EPI equation. This calculation has not been validated in all clinical situations. eGFR's persistently <90 mL/min signify possible Chronic Kidney Disease.    Anion gap 5 5 - 15  Ethanol     Status: None   Collection Time: 07/29/14  3:13 AM  Result Value Ref Range   Alcohol, Ethyl (B) <5 0 - 9 mg/dL    Comment:        LOWEST DETECTABLE LIMIT FOR SERUM ALCOHOL IS 11 mg/dL FOR MEDICAL PURPOSES ONLY   Acetaminophen level     Status: Abnormal   Collection Time: 07/29/14  3:13 AM  Result Value Ref Range   Acetaminophen (Tylenol), Serum <10.0 (L) 10 - 30 ug/mL    Comment:  THERAPEUTIC CONCENTRATIONS  VARY SIGNIFICANTLY. A RANGE OF 10-30 ug/mL MAY BE AN EFFECTIVE CONCENTRATION FOR MANY PATIENTS. HOWEVER, SOME ARE BEST TREATED AT CONCENTRATIONS OUTSIDE THIS RANGE. ACETAMINOPHEN CONCENTRATIONS >150 ug/mL AT 4 HOURS AFTER INGESTION AND >50 ug/mL AT 12 HOURS AFTER INGESTION ARE OFTEN ASSOCIATED WITH TOXIC REACTIONS.   Salicylate level     Status: None   Collection Time: 07/29/14  3:13 AM  Result Value Ref Range   Salicylate Lvl <1.0 2.8 - 20.0 mg/dL  Urinalysis, Routine w reflex microscopic     Status: Abnormal   Collection Time: 07/29/14  6:02 AM  Result Value Ref Range   Color, Urine YELLOW YELLOW   APPearance CLEAR CLEAR   Specific Gravity, Urine >1.030 (H) 1.005 - 1.030   pH 6.0 5.0 - 8.0   Glucose, UA NEGATIVE NEGATIVE mg/dL   Hgb urine dipstick NEGATIVE NEGATIVE   Bilirubin Urine NEGATIVE NEGATIVE   Ketones, ur NEGATIVE NEGATIVE mg/dL   Protein, ur NEGATIVE NEGATIVE mg/dL   Urobilinogen, UA 0.2 0.0 - 1.0 mg/dL   Nitrite NEGATIVE NEGATIVE   Leukocytes, UA NEGATIVE NEGATIVE    Comment: MICROSCOPIC NOT DONE ON URINES WITH NEGATIVE PROTEIN, BLOOD, LEUKOCYTES, NITRITE, OR GLUCOSE <1000 mg/dL.  Drug screen panel, emergency     Status: None   Collection Time: 07/29/14  6:03 AM  Result Value Ref Range   Opiates NONE DETECTED NONE DETECTED   Cocaine NONE DETECTED NONE DETECTED   Benzodiazepines NONE DETECTED NONE DETECTED   Amphetamines NONE DETECTED NONE DETECTED   Tetrahydrocannabinol NONE DETECTED NONE DETECTED   Barbiturates NONE DETECTED NONE DETECTED    Comment:        DRUG SCREEN FOR MEDICAL PURPOSES ONLY.  IF CONFIRMATION IS NEEDED FOR ANY PURPOSE, NOTIFY LAB WITHIN 5 DAYS.        LOWEST DETECTABLE LIMITS FOR URINE DRUG SCREEN Drug Class       Cutoff (ng/mL) Amphetamine      1000 Barbiturate      200 Benzodiazepine   960 Tricyclics       454 Opiates          300 Cocaine          300 THC              50     Physical Findings: AIMS: Facial and Oral  Movements Muscles of Facial Expression: None, normal Lips and Perioral Area: None, normal Jaw: None, normal Tongue: None, normal,Extremity Movements Upper (arms, wrists, hands, fingers): None, normal Lower (legs, knees, ankles, toes): None, normal, Trunk Movements Neck, shoulders, hips: None, normal, Overall Severity Severity of abnormal movements (highest score from questions above): None, normal Incapacitation due to abnormal movements: None, normal Patient's awareness of abnormal movements (rate only patient's report): No Awareness, Dental Status Current problems with teeth and/or dentures?: No Does patient usually wear dentures?: No  CIWA:  CIWA-Ar Total: 11 COWS:  COWS Total Score: 0  Assessment:Assessment: Patient is a 29 year old M ,who presented with worsening anxiety sx, negative thoughts on which he ruminates on , patient also with autism Do , continues to be anxious , will continue treatment.    Treatment Plan Summary: Daily contact with patient to assess and evaluate symptoms and progress in treatment and Medication management Will continue Zoloft 25 mg po daily for affective sx. Add Buspar 5 mg po tid for anxiety sx. Will provide support and reassurance. Will refer patient to outpatient psychotherapy on discharge. Reviewed labs. Pt encouraged to attend groups .  CSW will work on disposition.     Medical Decision Making:  New problem, with additional work up planned, Review of Psycho-Social Stressors (1), Review or order clinical lab tests (1), Review or order medicine tests (1), Review of Medication Regimen & Side Effects (2) and Review of New Medication or Change in Dosage (2)     Ercell Razon md 07/30/2014, 2:52 PM

## 2014-07-30 NOTE — [facility] Counselor (Signed)
Adult Comprehensive Assessment  Patient ID: Eliezer ChampagneDevon Cregan, male   DOB: 02/03/1986, 29 y.o.   MRN: [id number]  Information Source: Information source: Patient  Current Stressors:  Educational / Learning stressors: None reported Employment / Job issues: None reported Family Relationships: None reported Surveyor, quantityinancial / Lack of resources (include bankruptcy): None reported Housing / Lack of housing: None reported Physical health (include injuries & life threatening diseases): None reported Social relationships: having issues with people on you tube Substance abuse: None reported Bereavement / Loss: None reported  Living/Environment/Situation:  Living Arrangements: Alone Living conditions (as described by patient or guardian): Pt reports supportive  How long has patient lived in current situation?: since 2006 What is atmosphere in current home: Supportive  Family History:  Marital status: Single Does patient have children?: No  Childhood History:  By whom was/is the patient raised?: Other (Comment) (Pt reports being raised by his great aunt) Additional childhood history information: None reported Description of patient's relationship with caregiver when they were a child: Pt reported his great aunt was supportive and he had contact with his parents while growing up.   Patient's description of current relationship with people who raised him/her: Pt reports his great aunt died in 2011.  Pt reports both of his parents are supportive of him currently.  Does patient have siblings?: Yes Number of Siblings: 4 Description of patient's current relationship with siblings: Pt reports a sister, two half sisters, and a half brother.  Did patient suffer any verbal/emotional/physical/sexual abuse as a child?: No Did patient suffer from severe childhood neglect?: No Has patient ever been sexually abused/assaulted/raped as an adolescent or adult?: No Was the patient ever a victim of a crime or a  disaster?: No Witnessed domestic violence?: No Has patient been effected by domestic violence as an adult?: No  Education:  Highest grade of school patient has completed: 12 Currently a student?: No Learning disability?: Yes What learning problems does patient have?: Pt reports he thinks he has a "touch of autism"  Employment/Work Situation:   Employment situation: Unemployed Patient's job has been impacted by current illness: No What is the longest time patient has a held a job?: 2003-2006 Where was the patient employed at that time?: Pt reports working at [employer] until 2006, then working at [employer] for two months, then going to a job at [employer] Has patient ever been in the [employer] and Companymilitary?: No Has patient ever served in Buyer, retailcombat?: No  Financial Resources:   Surveyor, quantityinancial resources: Support from parents / caregiver Does patient have a Lawyerrepresentative payee or guardian?: No  Alcohol/Substance Abuse:   What has been your use of drugs/alcohol within the last 12 months?: Pt reports no current or hx of SA If attempted suicide, did drugs/alcohol play a role in this?: No Alcohol/Substance Abuse Treatment Hx: Denies past history Has alcohol/substance abuse ever caused legal problems?: No  Social Support System:   Forensic psychologistatient's Community Support System: Good Describe Community Support System: Pt reports most of his supports are his family Type of faith/religion: None reported How does patient's faith help to cope with current illness?: None reported  Leisure/Recreation:   Leisure and Hobbies: "browse youtube"  Strengths/Needs:   What things does the patient do well?: "i can comprehend some things well" In what areas does patient struggle / problems for patient: "i obssess over what people think"  Discharge Plan:   Does patient have access to transportation?: Yes Will patient be returning to same living situation after discharge?: Yes Currently receiving community mental health  services: No If no, would  patient like referral for services when discharged?: Yes (What county?) ([place]) Does patient have financial barriers related to discharge medications?: No  Summary/Recommendations:   Pt is a 29 year old male with a current diagnosis of Unspecified Anxiety D/O, Depressive Disorder Unspecified , and R/O OCD.  Pt was admitted to the hospital due to having to have mobile crisis come to the home to help with a situation where pt became agitated with a person on youtube making comments.  During interview pt continued to bring up the situation.  Pt reports he lives alone currently and wants to live with a family member.  Pt reports he does not see a psychiatrist in the community, however he would be willing to start once he is discharged from the hospital.  CSW agrees with inpatient admission to help support with crisis support, group intervention, and psychiatry and medication management services.  Pt signed Discharge form and expressed understanding.  Pt declined [facility] referral.  CSW will continue to follow to assist with discharge planning.   Seabron Spates. 07/30/2014

## 2014-07-30 NOTE — Progress Notes (Signed)
Adult Psychoeducational Group Note  Date:  07/30/2014 Time:  8:44 PM  Group Topic/Focus:  Wrap-Up Group:   The focus of this group is to help patients review their daily goal of treatment and discuss progress on daily workbooks.  Participation Level:  Minimal  Participation Quality:  Monopolizing  Affect:  Appropriate  Cognitive:  Disorganized  Insight: Lacking  Engagement in Group:  Engaged  Modes of Intervention:  Discussion  Additional Comments: The patient expressed that he did not attend group.The patient was also able to be redirected about his issues.  Octavio Mannshigpen, Shaquavia Whisonant Lee 07/30/2014, 8:44 PM

## 2014-07-30 NOTE — Progress Notes (Signed)
D: Pt denies SI/HI/AVH. Pt is pleasant and cooperative. Pt avoids, pt obviously responding but continues to deny.    A: Pt was offered support and encouragement. Pt was given scheduled medications. Pt was encourage to attend groups. Q 15 minute checks were done for safety.   R:Pt attends groups and interacts well with peers and staff. Pt is taking medication. Pt has no complaints at this time  .Pt receptive to treatment and safety maintained on unit.

## 2014-07-30 NOTE — Progress Notes (Signed)
D Djon  Cont with his bizarre behaviors. He is very compliant with his medications and takes them as scheduled. He stays in his room...isolating  In between   meds and groups.   A Pt avoids any and all eye contact and he continues to " converse" with whomever he's talking with ..the  entire time he"s standing at the med window and the nurse is speaking to him.   R POC cont .

## 2014-07-30 NOTE — [facility] Group Notes (Signed)
[facility] Group Notes:  (Clinical Social Work)  07/30/2014   11:15am-12:00pm  Summary of Progress/Problems:  The main focus of today's process group was to listen to a variety of genres of music and to identify that different types of music provoke different responses.  The patient then was able to identify personally what was soothing for them, as well as energizing.  Handouts were used to record feelings evoked, as well as how patient can personally use this knowledge in sleep habits, with depression, and with other symptoms.  The patient expressed understanding of concepts, as well as knowledge of how each type of music affected him/her and how this can be used at home as a wellness/recovery tool.  He stated at one point that he knows nobody will believe him, but then shared a delusion.  CSW just acknowledged and moved on.  Type of Therapy:  Music Therapy   Participation Level:  Active  Participation Quality:  Attentive and Sharing  Affect:  Blunted  Cognitive:  Oriented  Insight: Delusional  Engagement in Therapy:  Engaged  Modes of Intervention:   Activity, Exploration  Jacob MantleMareida Grossman-Orr, LCSW 07/30/2014, 12:30pm

## 2014-07-31 DIAGNOSIS — F840 Autistic disorder: Secondary | ICD-10-CM

## 2014-07-31 DIAGNOSIS — F4321 Adjustment disorder with depressed mood: Principal | ICD-10-CM

## 2014-07-31 LAB — HEPATITIS PANEL, ACUTE
HCV AB: NEGATIVE
HEP A IGM: NONREACTIVE
HEP B C IGM: NONREACTIVE
Hepatitis B Surface Ag: NEGATIVE

## 2014-07-31 NOTE — [facility] Group Notes (Signed)
[facility]   07/31/2014 11:06 AM  Participation Quality:  Minimal  Mood/Affect:  Flat  Depression Rating:    Anxiety Rating:    Thoughts of Suicide:  No Will you contract for safety?   NA  Current AVH:  Denies  Plan for Discharge/Comments:  Made a brief appearance towards the end of group.  Not disruptive.  Denies previous hospitalizations.  States it was his parents idea for him to come him.  Alluded to a "U-tube" situation that preceeded admission.  Transportation Means:   Supports:  Daryel GeraldNorth, Belton Peplinski B

## 2014-07-31 NOTE — [facility] Group Notes (Signed)
[facility] LCSW Group Therapy  07/31/2014 1:15 pm  Type of Therapy: Process Group Therapy  Participation Level:  Active  Participation Quality:  Appropriate  Affect:  Flat  Cognitive:  Oriented  Insight:  Improving  Engagement in Group:  Limited  Engagement in Therapy:  Limited  Modes of Intervention:  Activity, Clarification, Education, Problem-solving and Support  Summary of Progress/Problems: Today's group addressed the issue of overcoming obstacles.  Patients were asked to identify their biggest obstacle post d/c that stands in the way of their on-going success, and then problem solve as to how to manage this.  Stayed the entire time.  Participated in a disorganized fashion.  Unable to follow his train of thought.  Cites lots of what appear to be perseverative thoughts, ie what others think of him going back to high school, and "vindictive strangers who prey on others."  Ida Rogueorth, Haliegh Khurana B 07/31/2014   2:38 PM

## 2014-07-31 NOTE — Progress Notes (Signed)
Psychoeducational Group Note  Date:  07/31/2014 Time:  1148  Group Topic/Focus:  Developing a Wellness Toolbox:   The focus of this group is to help patients develop a "wellness toolbox" with skills and strategies to promote recovery upon discharge.  Participation Level: Did Not Attend  Participation Quality:  Not Applicable  Affect:  Not Applicable  Cognitive:  Not Applicable  Insight:  Not Applicable  Engagement in Group: Not Applicable  Additional Comments:  Pt spent all of group pacing up and down the hall.  Shaquia Berkley E 07/31/2014, 1:30 PM

## 2014-07-31 NOTE — Progress Notes (Signed)
D:Pt is avoidant with eye contact. He reports that he slept well. Pt reports that he is maintaining when asked how he is doing today. He spoke of obsession over you tube. Pt continues to be cautious with minimal interaction. A:Offered support and 15 minute checks. R:Safety maintained on the unit.

## 2014-07-31 NOTE — Tx Team (Signed)
  Interdisciplinary Treatment Plan Update   Date Reviewed:  07/31/2014  Time Reviewed:  8:53 AM  Progress in Treatment:   Attending groups: Yes Participating in groups: Disorganized Taking medication as prescribed: Yes  Tolerating medication: Yes Family/Significant other contact made: No Patient understands diagnosis: No  Limited insight Discussing patient identified problems/goals with staff: Yes  See initial care plan Medical problems stabilized or resolved: Yes Denies suicidal/homicidal ideation: Yes  In tx team Patient has not harmed self or others: Yes  For review of initial/current patient goals, please see plan of care.  Estimated Length of Stay:  4-5 days  Reason for Continuation of Hospitalization: Medication stabilization Other; describe Disorganization, bizarre sppech  New Problems/Goals identified:  N/A  Discharge Plan or Barriers:   return home, follow up outpt  Additional Comments: Jacob NuttingDevon is a 29 year old African-American male. [facility] complaints of having experienced a negative comment on his You-tube channel and has fixated on it. He made a passive suicidal comment and his parents called the mobile crisis.  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 I had a You-tube situation with a person that put a derogatory comment on my name. She called me a dumb-ass. That upsets me. I just called whoever I can after that to maintain as mch as I could. That is the biggest thing that is going on with me at the moment. I just want to know what this person perceived me to be. I don't believe that I'm going to be all right with this in my mind. I'm obsessing over this You-tube situation". Zoloft, Buspar, Trazodone trial  Attendees:  Signature: Ivin BootySarama Eappen, MD 07/31/2014 8:53 AM   Signature: Richelle Itood Melvenia Favela, LCSW 07/31/2014 8:53 AM  Signature: Fransisca KaufmannLaura Davis, NP 07/31/2014 8:53 AM  Signature: Waynetta SandyJan Wright, RN 07/31/2014 8:53 AM   Signature:  07/31/2014 8:53 AM  Signature:  07/31/2014 8:53 AM  Signature:   07/31/2014 8:53 AM  Signature:    Signature:    Signature:    Signature:    Signature:    Signature:      Scribe for Treatment Team:   Richelle Itood Elyssia Strausser, LCSW  07/31/2014 8:53 AM

## 2014-07-31 NOTE — Progress Notes (Addendum)
Patient ID: Jacob Price, male   DOB: 06/25/1985, 29 y.o.   MRN: [id number] [facility] Note  07/31/2014 3:19 PM Jacob Price  MRN:  [id number]  Subjective: Patient states "I'm doing fair today. I have been maintaining. I have got some combative statement to give to the person that made the derogatory statement against me on you-tube".  Objective: Patient seen and chart reviewed.Discussed patient with treatment team. Pt continues to be withdrawn, has limited interaction with peers, has minimal eye contact, reports having a 'touch of autism. He is visible on the unit. Participates in group milieu. Patient continues to have negative thoughts , reports he ruminates on them and sometimes it is difficult to get it out of his head. Pt otherwise denies any other complaints. Patient encouraged to continue to attend groups.  Principal Problem: Adjustment disorder with depressed mood R/O MDD ,R/O GAD Diagnosis:   Patient Active Problem List   Diagnosis Date Noted  . Adjustment disorder with depressed mood [F43.21] 07/29/2014  . Autism spectrum disorder [F84.0] 07/29/2014   Total Time spent with patient: 25 minutes  Past Medical History: History reviewed. No pertinent past medical history. History reviewed. No pertinent past surgical history. Family History: History reviewed. No pertinent family history. Social History:  History  Alcohol Use No     History  Drug Use No    History   Social History  . Marital Status: Married    Spouse Name: N/A  . Number of Children: N/A  . Years of Education: N/A   Social History Main Topics  . Smoking status: Never Smoker   . Smokeless tobacco: Not on file  . Alcohol Use: No  . Drug Use: No  . Sexual Activity: Not Currently   Other Topics Concern  . None   Social History Narrative   Additional History:    Sleep: Good  Appetite:  Fair  Musculoskeletal: Strength & Muscle Tone: within normal limits Gait & Station: normal Patient  leans: N/A   Psychiatric Specialty Exam: Physical Exam  ROS  Blood pressure 137/96, pulse 70, temperature 97.4 F (36.3 C), temperature source Oral, resp. rate 20, height 5' 10.67" (1.795 m), weight 119.75 kg (264 lb).Body mass index is 37.17 kg/(m^2).  General Appearance: Casual  Eye Contact::  Minimal  Speech:  Slow and has stuttering  Volume:  Decreased  Mood:  Anxious  Affect:  Flat  Thought Process:  Coherent  Orientation:  Full (Time, Place, and Person)  Thought Content:  Rumination  Suicidal Thoughts:  No  Homicidal Thoughts:  No  Memory:  Immediate;   Fair Recent;   Fair Remote;   Fair  Judgement:  Impaired  Insight:  Lacking  Psychomotor Activity:  Normal  Concentration:  Poor  Recall:  [employer]  Fund of Knowledge:Fair  Language: Fair  Akathisia:  No  Handed:  Right  AIMS (if indicated):     Assets:  Desire for Improvement  ADL's:  Intact  Cognition: WNL  Sleep:  Number of Hours: 6.75   Current Medications: Current Facility-Administered Medications  Medication Dose Route Frequency Provider Last Rate Last Dose  . acetaminophen (TYLENOL) tablet 650 mg  650 mg Oral Q6H PRN Sanjuana KavaAgnes I Nwoko, NP      . alum & mag hydroxide-simeth (MAALOX/MYLANTA) 200-200-20 MG/5ML suspension 30 mL  30 mL Oral Q4H PRN Sanjuana KavaAgnes I Nwoko, NP      . busPIRone (BUSPAR) tablet 5 mg  5 mg Oral TID Jomarie LongsSaramma Eappen, MD   5 mg at  07/31/14 1158  . magnesium hydroxide (MILK OF MAGNESIA) suspension 30 mL  30 mL Oral Daily PRN Sanjuana Kava, NP      . sertraline (ZOLOFT) tablet 25 mg  25 mg Oral Daily Sanjuana Kava, NP   25 mg at 07/31/14 0849  . traZODone (DESYREL) tablet 50 mg  50 mg Oral QHS Sanjuana Kava, NP   50 mg at 07/29/14 2200   Lab Results:  Results for orders placed or performed during the hospital encounter of 07/29/14 (from the past 48 hour(s))  Hepatitis panel, acute     Status: None   Collection Time: 07/30/14  7:25 PM  Result Value Ref Range   Hepatitis B Surface Ag NEGATIVE NEGATIVE    HCV Ab NEGATIVE NEGATIVE   Hep A IgM NON REACTIVE NON REACTIVE    Comment: (NOTE) Effective March 13, 2014, Hepatitis Acute Panel (test code 13086) will be revised to automatically reflex to the Hepatitis C Viral RNA, Quantitative, Real-Time PCR assay if the Hepatitis C antibody screening result is Reactive. This action is being taken to ensure that the CDC/USPSTF recommended HCV diagnostic algorithm with the appropriate test reflex needed for accurate interpretation is followed.    Hep B C IgM NON REACTIVE NON REACTIVE    Comment: (NOTE) High levels of Hepatitis B Core IgM antibody are detectable during the acute stage of Hepatitis B. This antibody is used to differentiate current from past HBV infection. Performed at [facility]    Physical Findings: AIMS: Facial and Oral Movements Muscles of Facial Expression: None, normal Lips and Perioral Area: None, normal Jaw: None, normal Tongue: None, normal,Extremity Movements Upper (arms, wrists, hands, fingers): None, normal Lower (legs, knees, ankles, toes): None, normal, Trunk Movements Neck, shoulders, hips: None, normal, Overall Severity Severity of abnormal movements (highest score from questions above): None, normal Incapacitation due to abnormal movements: None, normal Patient's awareness of abnormal movements (rate only patient's report): No Awareness, Dental Status Current problems with teeth and/or dentures?: No Does patient usually wear dentures?: No  CIWA:  CIWA-Ar Total: 11 COWS:  COWS Total Score: 0  Assessment:Assessment: Patient is a 29 year old M, who presented with worsening anxiety symptoms, negative thoughts about someone that was used a derogatory statement on him on a you-tube in which he ruminates on, patient also with autism spectrum disorder which makes him ruminate and get fixated on issues that distresses him. He remains anxious to some extend today, but is participating in group milieu.    Treatment Plan Summary: Daily contact with patient to assess and evaluate symptoms and progress in treatment and Medication management Will continue Zoloft 25 mg po daily for affective symptoms,  Buspar 5 mg po tid for anxiety sx. Will provide support and reassurance. Will refer patient to outpatient psychotherapy on discharge. Reviewed labs, no changes. Pt encouraged to attend groups . CSW will work on disposition.  Medical Decision Making:  New problem, with additional work up planned, Review of Psycho-Social Stressors (1), Review or order clinical lab tests (1), Review or order medicine tests (1), Review of Medication Regimen & Side Effects (2) and Review of New Medication or Change in Dosage (2)  Sanjuana Kava, PMHNP, FNP-BC 07/31/2014, 3:19 PM  Agree with Progress note as above

## 2014-07-31 NOTE — Progress Notes (Signed)
Patient ID: Jacob Price, male   DOB: 09/29/1985, 29 y.o.   MRN: [id number] D: Client visits with Mom and GM today, reports he is here because of "a u-tube incident" Client is disorganized, unable to elaborate on the incident. A: Writer introduced self to client, encouraged client to report any concerns. Reviewed medications. Staff will monitor q4715min for safety. R: Client is safe on the unit, attended group. Client refused sleep medications.

## 2014-07-31 NOTE — Progress Notes (Signed)
[facility] Group Notes:  (Nursing/MHT/Case Management/Adjunct)  Date:  07/31/2014  Time:  8:51 PM  Type of Therapy:  Psychoeducational Skills  Participation Level:  Minimal  Participation Quality:  Redirectable  Affect:  Flat  Cognitive:  Disorganized  Insight:  Limited  Engagement in Group:  Limited  Modes of Intervention:  Education  Summary of Progress/Problems: The patient arrived late for group and mentioned that his day went "pretty well". He hesitated in terms of discussing his day and wanted to talk about what led to his admission. He did share that he had a good visit with his mother and another family member. In terms of the theme for the day, his wellness technique will be to go outside and "maintain" himself.   Hazle CocaGOODMAN, Everette Dimauro S 07/31/2014, 8:51 PM

## 2014-08-01 DIAGNOSIS — F4323 Adjustment disorder with mixed anxiety and depressed mood: Secondary | ICD-10-CM | POA: Diagnosis present

## 2014-08-01 DIAGNOSIS — F411 Generalized anxiety disorder: Secondary | ICD-10-CM | POA: Diagnosis present

## 2014-08-01 MED ORDER — BUSPIRONE HCL 15 MG PO TABS
7.5000 mg | ORAL_TABLET | Freq: Three times a day (TID) | ORAL | Status: DC
  Administered 2014-08-01 – 2014-08-03 (×7): 7.5 mg via ORAL
  Filled 2014-08-01: qty 21
  Filled 2014-08-01 (×3): qty 1
  Filled 2014-08-01 (×2): qty 21
  Filled 2014-08-01 (×5): qty 1

## 2014-08-01 NOTE — [facility] Group Notes (Signed)
[facility] LCSW Group Therapy  08/01/2014 , 1:09 PM   Type of Therapy:  Group Therapy  Participation Level:  Active  Participation Quality:  Attentive  Affect:  Appropriate  Cognitive:  Alert  Insight:  Improving  Engagement in Therapy:  Engaged  Modes of Intervention:  Discussion, Exploration and Socialization  Summary of Progress/Problems: Today's group focused on the term Diagnosis.  Participants were asked to define the term, and then pronounce whether it is a negative, positive or neutral term.Amear sat quietly throughout group looking down at the floor.  Did not offer any spontaneous thoughts, but responded when questioned directly.  Continues to focus on "obsessive thoughts about what other's think of me."  Identified that he feels most people underestimate him, which leaves him feeling alone and isolated.  Took off on a tangent about the "illuminate" and "sex change."  Daryel Geraldorth, Anthoni Geerts B 08/01/2014 , 1:09 PM

## 2014-08-01 NOTE — Progress Notes (Signed)
Patient ID: Jacob Price, male   DOB: 1985/12/19, 29 y.o.   MRN: [id number] [facility] MD Progress Note  08/01/2014 1:51 PM Jacob Price  MRN:  [id number]  Subjective: Patient states "I'm ok.I am still thinking about those stuff , I still obsess about things that happened.'   Objective: Patient seen and chart reviewed.Discussed patient with treatment team. Pt with depressed affect , poor eye contact , patient's affect is more reactive at times , which is an improvement since admission. Pt continues to have anxiety , worries , ruminates about things that happened prior to admission ( u tube incident.)  Pt otherwise is very minimal , interaction is limited to answers that are very short. Pt denies SI/HI/AH/VH.  Collateral information was obtained from Mother -Aliene Beams at [phone] - per mother pt has a hx of autism, pt lives by self in an apartment . Pt has mother and two grandmother's who are supportive , who helps him out . Mother worried about lack of services for patient since per social security administration , they need documentation about his diagnosis . Per mother this is his first hospitalization.      Principal Problem: Generalized anxiety disorder    Diagnosis:   Patient Active Problem List   Diagnosis Date Noted  . Adjustment disorder with mixed anxiety and depressed mood [F43.23] 08/01/2014  . Generalized anxiety disorder [F41.1] 08/01/2014  . Autism spectrum disorder [F84.0] 07/29/2014   Total Time spent with patient: 25 minutes  Past Medical History: History reviewed. No pertinent past medical history. History reviewed. No pertinent past surgical history. Family History: History reviewed. No pertinent family history. Social History:  History  Alcohol Use No     History  Drug Use No    History   Social History  . Marital Status: Married    Spouse Name: N/A  . Number of Children: N/A  . Years of Education: N/A   Social History Main Topics  . Smoking status:  Never Smoker   . Smokeless tobacco: Not on file  . Alcohol Use: No  . Drug Use: No  . Sexual Activity: Not Currently   Other Topics Concern  . None   Social History Narrative   Additional History:    Sleep: Good  Appetite:  Fair  Musculoskeletal: Strength & Muscle Tone: within normal limits Gait & Station: normal Patient leans: N/A   Psychiatric Specialty Exam: Physical Exam  Review of Systems  Psychiatric/Behavioral: Positive for depression.    Blood pressure 137/103, pulse 69, temperature 98.5 F (36.9 C), temperature source Oral, resp. rate 18, height 5' 10.67" (1.795 m), weight 119.75 kg (264 lb).Body mass index is 37.17 kg/(m^2).  General Appearance: Casual  Eye Contact::  Minimal  Speech:  Slow and has stuttering  Volume:  Decreased  Mood:  Anxious reactive   Affect:  Flat  Thought Process:  Coherent  Orientation:  Full (Time, Place, and Person)  Thought Content:  Rumination  Suicidal Thoughts:  No  Homicidal Thoughts:  No  Memory:  Immediate;   Fair Recent;   Fair Remote;   Fair  Judgement:  Impaired  Insight:  Lacking  Psychomotor Activity:  Normal  Concentration:  Fair  Recall:  [employer] of Knowledge:Fair  Language: Fair  Akathisia:  No  Handed:  Right  AIMS (if indicated):     Assets:  Desire for Improvement  ADL's:  Intact  Cognition: WNL  Sleep:  Number of Hours: 6.25   Current Medications: Current Facility-Administered Medications  Medication Dose Route Frequency Provider Last Rate Last Dose  . acetaminophen (TYLENOL) tablet 650 mg  650 mg Oral Q6H PRN Sanjuana KavaAgnes I Nwoko, NP      . alum & mag hydroxide-simeth (MAALOX/MYLANTA) 200-200-20 MG/5ML suspension 30 mL  30 mL Oral Q4H PRN Sanjuana KavaAgnes I Nwoko, NP      . busPIRone (BUSPAR) tablet 7.5 mg  7.5 mg Oral TID Jomarie LongsSaramma Joey Hudock, MD      . magnesium hydroxide (MILK OF MAGNESIA) suspension 30 mL  30 mL Oral Daily PRN Sanjuana KavaAgnes I Nwoko, NP      . sertraline (ZOLOFT) tablet 25 mg  25 mg Oral Daily Sanjuana KavaAgnes I  Nwoko, NP   25 mg at 08/01/14 0752  . traZODone (DESYREL) tablet 50 mg  50 mg Oral QHS Sanjuana KavaAgnes I Nwoko, NP   50 mg at 07/29/14 2200   Lab Results:  Results for orders placed or performed during the hospital encounter of 07/29/14 (from the past 48 hour(s))  Hepatitis panel, acute     Status: None   Collection Time: 07/30/14  7:25 PM  Result Value Ref Range   Hepatitis B Surface Ag NEGATIVE NEGATIVE   HCV Ab NEGATIVE NEGATIVE   Hep A IgM NON REACTIVE NON REACTIVE    Comment: (NOTE) Effective March 13, 2014, Hepatitis Acute Panel (test code 1610922940) will be revised to automatically reflex to the Hepatitis C Viral RNA, Quantitative, Real-Time PCR assay if the Hepatitis C antibody screening result is Reactive. This action is being taken to ensure that the CDC/USPSTF recommended HCV diagnostic algorithm with the appropriate test reflex needed for accurate interpretation is followed.    Hep B C IgM NON REACTIVE NON REACTIVE    Comment: (NOTE) High levels of Hepatitis B Core IgM antibody are detectable during the acute stage of Hepatitis B. This antibody is used to differentiate current from past HBV infection. Performed at [facility]    Physical Findings: AIMS: Facial and Oral Movements Muscles of Facial Expression: None, normal Lips and Perioral Area: None, normal Jaw: None, normal Tongue: None, normal,Extremity Movements Upper (arms, wrists, hands, fingers): None, normal Lower (legs, knees, ankles, toes): None, normal, Trunk Movements Neck, shoulders, hips: None, normal, Overall Severity Severity of abnormal movements (highest score from questions above): None, normal Incapacitation due to abnormal movements: None, normal Patient's awareness of abnormal movements (rate only patient's report): No Awareness, Dental Status Current problems with teeth and/or dentures?: No Does patient usually wear dentures?: No  CIWA:  CIWA-Ar Total: 11 COWS:  COWS Total Score:  0  Assessment:Assessment: Patient is a 29 year old M, who presented with worsening anxiety symptoms, negative thoughts about someone that was used a derogatory statement on him on a you-tube in which he ruminates on, patient also with autism spectrum disorder which makes him ruminate and get fixated on issues that distresses him. He is improving , his affect is more reactive , is participating in group milieu.   Treatment Plan Summary: Daily contact with patient to assess and evaluate symptoms and progress in treatment and Medication management Will continue Zoloft 25 mg po daily for affective symptoms. Increase Buspar to 7.5 mg po tid for anxiety sx. Will provide support and reassurance. Will refer patient to outpatient psychotherapy on discharge. Reviewed labs, no changes. Pt encouraged to attend groups . CSW will work on disposition.  Medical Decision Making:  Review of Psycho-Social Stressors (1), Review or order clinical lab tests (1), Review or order medicine tests (1), Review of Medication  Regimen & Side Effects (2) and Review of New Medication or Change in Dosage (2)  Miyanna Wiersma, MD 08/01/2014, 1:51 PM

## 2014-08-01 NOTE — [facility] Group Notes (Signed)
[facility] Group Notes:  (Nursing/MHT/Case Management/Adjunct)  Patient attended 0900 [employer] color therapy group. Patient was appropriate during this group.

## 2014-08-01 NOTE — Progress Notes (Signed)
Patient ID: Jacob ChampagneDevon Gossard, male   DOB: 04/11/1986, [age] y.o.   MRN: [id number] D: Client is in his room, reports his focus is "on derogatory things said towards my name on U tube" "I obsess over what various people perceive over me" "I think to much about what his said to think about hurting myself" A: Writer provided emotional support, encouraged client to use coping skills and think of positive things about himself, point out to client he has been cooperative and pleasant since his admission which is a positive affirmation. Client encouraged to tell staff two positive things about himself before end of shift. Staff will monitor q8015min for safety. R: Client is safe on the unit, attended group. Client reports "I'm pretty nice to people"

## 2014-08-01 NOTE — Progress Notes (Signed)
Adult Psychoeducational Group Note  Date:  08/01/2014 Time:  8:56 PM  Group Topic/Focus:  Wrap-Up Group:   The focus of this group is to help patients review their daily goal of treatment and discuss progress on daily workbooks.  Participation Level:  Minimal  Participation Quality:  Appropriate  Affect:  Flat  Cognitive:  Lacking  Insight: Limited  Engagement in Group:  Limited  Modes of Intervention:  Socialization and Support  Additional Comments:  Patient attended and participated in group tonight. He reports having a good day. He went for his meals and attended his groups. He also thought about what he has been obsessing over.   Lita MainsFrancis, Ramey Ketcherside [facility] 08/01/2014, 8:56 PM

## 2014-08-01 NOTE — Progress Notes (Signed)
D: patient continues to obsess and ruminate on prior events to admission.  He has minimal interaction with staff and others.  Patient presents with flat, blunted affect; poor eye contact.  He reports good sleep and appetite.  He denies SI/HI/AVH.  He is compliant with his medications.  Patient states his goal today is, "everyday and forever I have to maintain what it is I obsess over." A: Continue to monitor medication management and MD orders.  Safety checks completed every 15 minutes per protocol.  Meet 1;1 with patient to discuss concerns and offer encouragement. R: Patient is cooperative and needs no redirection.

## 2014-08-02 MED ORDER — TRIAMTERENE-HCTZ 37.5-25 MG PO TABS
1.0000 | ORAL_TABLET | Freq: Every day | ORAL | Status: DC
  Administered 2014-08-02 – 2014-08-03 (×2): 1 via ORAL
  Filled 2014-08-02 (×3): qty 1
  Filled 2014-08-02: qty 3

## 2014-08-02 MED ORDER — SERTRALINE HCL 50 MG PO TABS
50.0000 mg | ORAL_TABLET | Freq: Every day | ORAL | Status: DC
  Administered 2014-08-03: 50 mg via ORAL
  Filled 2014-08-02 (×2): qty 1
  Filled 2014-08-02: qty 14

## 2014-08-02 NOTE — Progress Notes (Signed)
Pt calm and cooperative. Pt attended groups. Pt compliant with meds. Pt denies SI/HI/AH/VH. Pt reports sleeping well.  No current concerns. RN will continue to monitor.

## 2014-08-02 NOTE — Progress Notes (Signed)
D: Pt has preoccupied affect and mood.  Pt avoids eye contact and interacts with others cautiously.  Pt reports his day has been "pretty good" and "I haven't had any pain problems."  Pt reports he had a good visit with both of his grandmothers, his mother, and his sister.  Pt denies SI/HI, denies hallucinations.  Pt attended evening group.   A: Introduced self to pt and met with pt 1:1.  Offered support and encouragement.  Scheduled medication offered to pt.  Pt refused scheduled Trazodone.   R: Pt verbally contracts for safety.  Will continue to monitor and assess.

## 2014-08-02 NOTE — Progress Notes (Signed)
Patient ID: Jacob Price, male   DOB: May 12, 1985, 29 y.o.   MRN: [id number] [facility] MD Progress Note  08/02/2014 1:47 PM Jacob Price  MRN:  [id number]  Subjective: Patient states "I'm ok.I guess obsessing about things may be something that I have to live with for the rest of my life. I feel bad about the way people perceive me. You don't understand , sometimes people under estimate me and treat me as though I am not human. I feel bad about the way they perceive me.'   Objective: Patient seen and chart reviewed.Discussed patient with treatment team. Pt with autism , limited interaction, poor eye contact , has difficulty verbalizing his thoughts , seems to continue to whisper after he speaks ( which appears to be his baseline). Pt appears to be less depressed since admission , his affect is more reactive , he has been attending groups , his insight seems to be good . Pt denies SI/HI/AH/VH. Pt denies ADR's of medications. Discussed with patient about psychotherapy /social skills training and other services like day program. CSW will work om this.  Principal Problem: Generalized anxiety disorder    Diagnosis:   Patient Active Problem List   Diagnosis Date Noted  . Adjustment disorder with mixed anxiety and depressed mood [F43.23] 08/01/2014  . Generalized anxiety disorder [F41.1] 08/01/2014  . Autism spectrum disorder [F84.0] 07/29/2014   Total Time spent with patient: 25 minutes  Past Medical History: History reviewed. No pertinent past medical history. History reviewed. No pertinent past surgical history. Family History: History reviewed. No pertinent family history. Social History:  History  Alcohol Use No     History  Drug Use No    History   Social History  . Marital Status: Married    Spouse Name: N/A  . Number of Children: N/A  . Years of Education: N/A   Social History Main Topics  . Smoking status: Never Smoker   . Smokeless tobacco: Not on file  . Alcohol Use: No   . Drug Use: No  . Sexual Activity: Not Currently   Other Topics Concern  . None   Social History Narrative   Additional History:    Sleep: Good  Appetite:  Fair  Musculoskeletal: Strength & Muscle Tone: within normal limits Gait & Station: normal Patient leans: N/A   Psychiatric Specialty Exam: Physical Exam  Review of Systems  Psychiatric/Behavioral: The patient is nervous/anxious.     Blood pressure 132/84, pulse 75, temperature 97.7 F (36.5 C), temperature source Oral, resp. rate 20, height 5' 10.67" (1.795 m), weight 119.75 kg (264 lb).Body mass index is 37.17 kg/(m^2).  General Appearance: Casual  Eye Contact::  Minimal  Speech:  Slow and has stuttering  Volume:  Decreased  Mood:  Anxious reactive   Affect:  Flat  Thought Process:  Coherent  Orientation:  Full (Time, Place, and Person)  Thought Content:  Rumination  Suicidal Thoughts:  No  Homicidal Thoughts:  No  Memory:  Immediate;   Fair Recent;   Fair Remote;   Fair  Judgement:  Impaired  Insight:  Lacking  Psychomotor Activity:  Normal  Concentration:  Fair  Recall:  [employer] of Knowledge:Fair  Language: Fair  Akathisia:  No  Handed:  Right  AIMS (if indicated):     Assets:  Desire for Improvement  ADL's:  Intact  Cognition: WNL  Sleep:  Number of Hours: 6.75   Current Medications: Current Facility-Administered Medications  Medication Dose Route Frequency Provider Last Rate  Last Dose  . acetaminophen (TYLENOL) tablet 650 mg  650 mg Oral Q6H PRN Sanjuana KavaAgnes I Nwoko, NP      . alum & mag hydroxide-simeth (MAALOX/MYLANTA) 200-200-20 MG/5ML suspension 30 mL  30 mL Oral Q4H PRN Sanjuana KavaAgnes I Nwoko, NP      . busPIRone (BUSPAR) tablet 7.5 mg  7.5 mg Oral TID Jomarie LongsSaramma Lariya Kinzie, MD   7.5 mg at 08/02/14 1258  . magnesium hydroxide (MILK OF MAGNESIA) suspension 30 mL  30 mL Oral Daily PRN Sanjuana KavaAgnes I Nwoko, NP      . Melene Muller[START ON 08/03/2014] sertraline (ZOLOFT) tablet 50 mg  50 mg Oral Daily Verta Riedlinger, MD      .  traZODone (DESYREL) tablet 50 mg  50 mg Oral QHS Sanjuana KavaAgnes I Nwoko, NP   50 mg at 07/29/14 2200  . triamterene-hydrochlorothiazide (MAXZIDE-25) 37.5-25 MG per tablet 1 tablet  1 tablet Oral Daily Kerry HoughSpencer E Simon, PA-C   1 tablet at 08/02/14 0840   Lab Results:  No results found for this or any previous visit (from the past 48 hour(s)). Physical Findings: AIMS: Facial and Oral Movements Muscles of Facial Expression: None, normal Lips and Perioral Area: None, normal Jaw: None, normal Tongue: None, normal,Extremity Movements Upper (arms, wrists, hands, fingers): None, normal Lower (legs, knees, ankles, toes): None, normal, Trunk Movements Neck, shoulders, hips: None, normal, Overall Severity Severity of abnormal movements (highest score from questions above): None, normal Incapacitation due to abnormal movements: None, normal Patient's awareness of abnormal movements (rate only patient's report): No Awareness, Dental Status Current problems with teeth and/or dentures?: No Does patient usually wear dentures?: No  CIWA:  CIWA-Ar Total: 11 COWS:  COWS Total Score: 0 08/02/14 Collateral information was obtained from Mother -Aliene Beamsrina Kadel at [phone] - per mother pt has a hx of autism, pt lives by self in an apartment . Pt has mother and two grandmother's who are supportive , who helps him out . Mother worried about lack of services for patient since per social security administration , they need documentation about his diagnosis . Per mother this is his first hospitalization.         Assessment:Assessment: Patient is a 29 year old M, who presented with worsening anxiety symptoms, negative thoughts about someone that used a derogatory statement on him on a you-tube on which he ruminates on, patient also with autism spectrum disorder which makes him ruminate and get fixated on issues that distresses him. Patient continues to struggle with cognitive distortion. Will provide support and continue  treatment .   Treatment Plan Summary: Daily contact with patient to assess and evaluate symptoms and progress in treatment and Medication management Will increase  Zoloft to 50 mg po daily for affective symptoms. Continue Buspar 7.5 mg po tid for anxiety sx. Will continue to provide support and reassurance. Will refer patient to outpatient psychotherapy on discharge.CSW will work on this .  Medical Decision Making:  Review of Psycho-Social Stressors (1), Review or order clinical lab tests (1), Review or order medicine tests (1), Review of Medication Regimen & Side Effects (2) and Review of New Medication or Change in Dosage (2)  Tayla Panozzo, MD 08/02/2014, 1:47 PM

## 2014-08-02 NOTE — [facility] Group Notes (Signed)
[facility]   08/02/2014 10:07 AM  Participation Quality:  Minimal  Mood/Affect:  Flat  Depression Rating:  "the obsessive thoughts"  Anxiety Rating:  "the obsessive thoughts"  Thoughts of Suicide:  No Will you contract for safety?   NA  Current AVH:  No  Plan for Discharge/Comments:  Minimal interaction.  Acknowledges that he may be leaving together.  Does not sound particularly optimistic not pessimistic about his return home.  Transportation Means: family  Supports: family  [name], Baldo DaubRodney B

## 2014-08-02 NOTE — [facility] Group Notes (Signed)
[facility] LCSW Group Therapy  08/02/2014 1:24 PM  Type of Therapy:  Group Therapy  Participation Level:  Minimal  Participation Quality:  Attentive  Affect:  Flat  Cognitive:  Alert  Insight:  Limited  Engagement in Therapy:  Limited  Modes of Intervention:  Discussion, Education, Socialization and Support  Summary of Progress/Problems:Mental Health Association (MHA) speaker came to talk about his personal journey with substance abuse and mental illness. Group members were challenged to process ways by which to relate to the speaker. MHA speaker provided handouts and educational information pertaining to groups and services offered by the [facility]. Jacob Price attended group and stayed the entire time. He sat quietly and listened to the speaker.    Price,Jacob 08/02/2014, 1:24 PM

## 2014-08-03 MED ORDER — TRAZODONE HCL 50 MG PO TABS: 50.0000 mg | ORAL_TABLET | Freq: Every day | ORAL | Status: DC

## 2014-08-03 MED ORDER — TRIAMTERENE-HCTZ 37.5-25 MG PO TABS: 1.0000 | ORAL_TABLET | Freq: Every day | ORAL | Status: DC

## 2014-08-03 MED ORDER — SERTRALINE HCL 50 MG PO TABS: 50.0000 mg | ORAL_TABLET | Freq: Every day | ORAL | Status: DC

## 2014-08-03 MED ORDER — BUSPIRONE HCL 7.5 MG PO TABS: 7.5000 mg | ORAL_TABLET | Freq: Three times a day (TID) | ORAL | Status: DC

## 2014-08-03 NOTE — Plan of Care (Signed)
Problem: Ineffective individual coping Goal: STG: Patient will remain free from self harm Outcome: Progressing Pt has not harmed himself tonight.  He denied thoughts of self-harm/SI.  Pt verbally contracted for safety.

## 2014-08-03 NOTE — Progress Notes (Signed)
  [facility] :  Will you be returning to the same living situation after discharge:  Yes,  home At discharge, do you have transportation home?: Yes,  family Do you have the ability to pay for your medications: Yes,  mental health  Release of information consent forms completed and in the chart;  Patient's signature needed at discharge.  Patient to Follow up at: Follow-up Information    [facility]   Specialty:  Behavioral Health   Why:  When discharged, you will need to go to the walk in clinic. They are open Monday-Friday at 8:00. Take all of your hospital paperwork.    Contact information:   [address] [place] [address] [phone]       Patient denies SI/HI: Yes,  yes    Safety Planning and Suicide Prevention discussed: Yes,  yes  Have you used any form of tobacco in the last 30 days? (Cigarettes, Smokeless Tobacco, Cigars, and/or Pipes): No  Has patient been referred to the Quitline?: N/A patient is not a smoker  [name], Jacob Price 08/03/2014, 3:01 PM

## 2014-08-03 NOTE — Progress Notes (Signed)
D: Patient is alert and oriented. Pt's mood and affect is depressed and flat. Pt avoids eye contact and forwards little with RN. Pt denies SI/HI and AVH. Pt rates depression, hopelessness, and anxiety 10/10. Pt is pleasant upon interaction. Pt reports his goal for the day is "Maintaing what it is I'm having to obsess over" by "maintaining myself." Pt reports he is ready to go home today and will first go to his grandmothers house upon discharge. Pt is attending unit groups. Pt is observed whispering to self at times. A: Encouragement/Support provided to pt. Active listening by RN. Medication education reviewed with pt. Scheduled medications administered per providers orders (See MAR). 15 minute checks continued per protocol for patient safety.  R: Patient cooperative and receptive to nursing interventions. Pt remains safe.

## 2014-08-03 NOTE — [facility] Group Notes (Signed)
[facility] Group Notes:  (Nursing/MHT/Case Management/Adjunct)  Date:  08/03/2014  Time:  0900am  Type of Therapy:  Nurse Education  Participation Level:  Minimal  Participation Quality:  Attentive  Affect:  Flat  Cognitive:  Appropriate  Insight:  Appropriate  Engagement in Group:  Engaged  Modes of Intervention:  Discussion, Education and Support  Summary of Progress/Problems: Patient attended group, remained engaged and responded appropriately when prompted.   Lendell CapriceGuthrie, Malyssa Maris A 08/03/2014, 10:24 AM

## 2014-08-03 NOTE — [facility] Suicide Risk Assessment (Signed)
[facility] INPATIENT:  Family/Significant Other Suicide Prevention Education  Suicide Prevention Education:  Education Completed; Denese Killingsrina Katel, mother, [phone] has been identified by the patient as the family member/significant other with whom the patient will be residing, and identified as the person(s) who will aid the patient in the event of a mental health crisis (suicidal ideations/suicide attempt).  With written consent from the patient, the family member/significant other has been provided the following suicide prevention education, prior to the and/or following the discharge of the patient.  The suicide prevention education provided includes the following:  Suicide risk factors  Suicide prevention and interventions  National Suicide Hotline telephone number  [facility] assessment telephone number  [facility] Emergency Assistance 911  [facility] and/or Residential Mobile Crisis Unit telephone number  Request made of family/significant other to:  Remove weapons (e.g., guns, rifles, knives), all items previously/currently identified as safety concern.    Remove drugs/medications (over-the-counter, prescriptions, illicit drugs), all items previously/currently identified as a safety concern.  The family member/significant other verbalizes understanding of the suicide prevention education information provided.  The family member/significant other agrees to remove the items of safety concern listed above.  Daryel Geraldorth, Rafferty Postlewait B 08/03/2014, 2:57 PM

## 2014-08-03 NOTE — Progress Notes (Signed)
DISCHARGE NOTE: D: Patient was alert, oriented, in stable condition, and ambulatory with a steady gait upon discharge. Pt denies SI/HI and AVH. A: AVS reviewed and given to pt. Follow up reviewed with pt. Resources reviewed with pt, including [facility]. Prescriptions/Medications given to pt. Belongings returned to pt. Pt given time to ask questions and express concerns. R: Pt D/C'd to mother.   

## 2014-08-03 NOTE — Discharge Summary (Signed)
Physician Discharge Summary Note  Patient:  Jacob Price is an 29 y.o., male MRN:  [id number] DOB:  09-Jul-1985 Patient phone:  [phone] (home)  Patient address:   [address] [place] [address],  Total Time spent with patient: 30 minutes  Date of Admission:  07/29/2014 Date of Discharge: 08/03/2014  Reason for Admission:  anxiety  Principal Problem: Generalized anxiety disorder Discharge Diagnoses: Patient Active Problem List   Diagnosis Date Noted  . Adjustment disorder with mixed anxiety and depressed mood [F43.23] 08/01/2014  . Generalized anxiety disorder [F41.1] 08/01/2014  . Autism spectrum disorder [F84.0] 07/29/2014    Musculoskeletal: Strength & Muscle Tone: within normal limits Gait & Station: normal Patient leans: N/A  Psychiatric Specialty Exam:  SEE SRA Physical Exam  Vitals reviewed. Psychiatric: He has a normal mood and affect.    Review of Systems  All other systems reviewed and are negative.   Blood pressure 126/77, pulse 84, temperature 98.1 F (36.7 C), temperature source Oral, resp. rate 20, height 5' 10.67" (1.795 m), weight 119.75 kg (264 lb), SpO2 99 %.Body mass index is 37.17 kg/(m^2).   Past Medical History: History reviewed. No pertinent past medical history. History reviewed. No pertinent past surgical history. Family History: History reviewed. No pertinent family history. Social History:  History  Alcohol Use No     History  Drug Use No    History   Social History  . Marital Status: Married    Spouse Name: N/A  . Number of Children: N/A  . Years of Education: N/A   Social History Main Topics  . Smoking status: Never Smoker   . Smokeless tobacco: Not on file  . Alcohol Use: No  . Drug Use: No  . Sexual Activity: Not Currently   Other Topics Concern  . None   Social History Narrative   Risk to Self: Is patient at risk for suicide?: No What has been your use of drugs/alcohol within the last 12 months?: Pt  reports no current or hx of SA Risk to Others:   Prior Inpatient Therapy:   Prior Outpatient Therapy:    Level of Care:  OP  Hospital Course:  Jacob Price is a 29 year old African-American male. Admitted to [facility] from the [facility] ED with complaints of having experienced a negative comment on his You-tube channel and has fixated on it. He made a passive suicidal comment and his parents called the mobile crisis.  Amarion reports, Product/process development scientist for an organization took me to the [employer]. I had a You-tube situation with a person that put a derogatory comment on my name. She called me a dumb-ass. That upsets me. I just called whoever I can after that to maintain as mch as I could. That is the biggest thing that is going on with me at the moment. I just want to know what this person perceived me to be. I don't believe that I'm going to be all right with this in my mind. I'm obsessing over this You-tube situation".  Jacob Price was admitted for Generalized anxiety disorder and crisis management.  She was treated discharged with the medications listed below under Medication List.  Medical problems were identified and treated as needed.  Home medications were restarted as appropriate.  Improvement was monitored by observation and [name] daily report of symptom reduction.  Emotional and mental status was monitored by daily self-inventory reports completed by [name] and clinical staff.  Jacob Price Jacob Price was evaluated by the treatment team for stability and plans for continued recovery upon discharge.  Jacob Price motivation was an integral factor for scheduling further treatment.  Employment, transportation, bed availability, health status, family support, and any pending legal issues were also considered during her hospital stay.  She was offered further treatment options upon discharge including but not limited to Residential, Intensive Outpatient, and Outpatient treatment.  Jacob Price  Jacob Price will follow up with the services as listed below under Follow Up Information.     Upon completion of this admission the patient was both mentally and medically stable for discharge denying suicidal/homicidal ideation, auditory/visual/tactile hallucinations, delusional thoughts and paranoia.      Consults:  psychiatry  Significant Diagnostic Studies:  labs: per ED  Discharge Vitals:   Blood pressure 126/77, pulse 84, temperature 98.1 F (36.7 C), temperature source Oral, resp. rate 20, height 5' 10.67" (1.795 m), weight 119.75 kg (264 lb), SpO2 99 %. Body mass index is 37.17 kg/(m^2). Lab Results:   No results found for this or any previous visit (from the past 72 hour(s)).  Physical Findings: AIMS: Facial and Oral Movements Muscles of Facial Expression: None, normal Lips and Perioral Area: None, normal Jaw: None, normal Tongue: None, normal,Extremity Movements Upper (arms, wrists, hands, fingers): None, normal Lower (legs, knees, ankles, toes): None, normal, Trunk Movements Neck, shoulders, hips: None, normal, Overall Severity Severity of abnormal movements (highest score from questions above): None, normal Incapacitation due to abnormal movements: None, normal Patient's awareness of abnormal movements (rate only patient's report): No Awareness, Dental Status Current problems with teeth and/or dentures?: No Does patient usually wear dentures?: No  CIWA:  CIWA-Ar Total: 11 COWS:  COWS Total Score: 0   See Psychiatric Specialty Exam and Suicide Risk Assessment completed by Attending Physician prior to discharge.  Discharge destination:  Home  Is patient on multiple antipsychotic therapies at discharge:  No   Has Patient had three or more failed trials of antipsychotic monotherapy by history:  No    Recommended Plan for Multiple Antipsychotic Therapies: NA     Medication List    STOP taking these medications        ibuprofen 800 MG tablet  Commonly known as:   ADVIL,MOTRIN      TAKE these medications      Indication   busPIRone 7.5 MG tablet  Commonly known as:  BUSPAR  Take 1 tablet (7.5 mg total) by mouth 3 (three) times daily.   Indication:  Symptoms of Feeling Anxious     sertraline 50 MG tablet  Commonly known as:  ZOLOFT  Take 1 tablet (50 mg total) by mouth daily.   Indication:  Major Depressive Disorder     traZODone 50 MG tablet  Commonly known as:  DESYREL  Take 1 tablet (50 mg total) by mouth at bedtime.   Indication:  Trouble Sleeping     triamterene-hydrochlorothiazide 37.5-25 MG per tablet  Commonly known as:  MAXZIDE-25  Take 1 tablet by mouth daily.   Indication:  High Blood Pressure           Follow-up Information    [facility]   Specialty:  Behavioral Health   Why:  When discharged, you will need to go to the walk in clinic. They are open Monday-Friday at 8:00. Take all of your hospital paperwork.    Contact informationElpidio Eric:   [address] [place] [address] [phone]       Follow-up recommendations:  Activity:  as tol, diet as tol  Comments:  1.  Take all your medications as prescribed.              2.  Report any adverse side effects to outpatient provider.                       3.  Patient instructed to not use alcohol or illegal drugs while on prescription medicines.            4.  In the event of worsening symptoms, instructed patient to call 911, the crisis hotline or go to nearest emergency room for evaluation of symptoms.  Total Discharge Time:  30 min  Signed: Velna Hatchet May Yarixa Lightcap  AGNP-BC  08/03/2014, 4:05 PM

## 2014-08-03 NOTE — [facility] Suicide Risk Assessment (Signed)
[facility]   Demographic Factors:  Male  Total Time spent with patient: 30 minutes  Musculoskeletal: Strength & Muscle Tone: within normal limits Gait & Station: normal Patient leans: N/A  Psychiatric Specialty Exam: Physical Exam  Review of Systems  Psychiatric/Behavioral: Negative for depression, suicidal ideas, hallucinations and substance abuse. The patient is not nervous/anxious and does not have insomnia.     Blood pressure 133/96, pulse 92, temperature 98.6 F (37 C), temperature source Oral, resp. rate 20, height 5' 10.67" (1.795 m), weight 119.75 kg (264 lb).Body mass index is 37.17 kg/(m^2).  General Appearance: Casual  Eye Contact::  Good  Speech:  Clear and Coherent409  Volume:  Normal  Mood:  Euthymic  Affect:  Appropriate  Thought Process:  Coherent  Orientation:  Full (Time, Place, and Person)  Thought Content:  WDL  Suicidal Thoughts:  No  Homicidal Thoughts:  No  Memory:  Immediate;   Fair Recent;   Fair Remote;   Fair  Judgement:  Fair  Insight:  Shallow  Psychomotor Activity:  Normal  Concentration:  Fair  Recall:  [employer]  Fund of Knowledge:Fair  Language: Fair  Akathisia:  No  Handed:  Right  AIMS (if indicated):     Assets:  Desire for Improvement Physical Health Social Support  Sleep:  Number of Hours: 6.75  Cognition: WNL  ADL's:  Intact   Have you used any form of tobacco in the last 30 days? (Cigarettes, Smokeless Tobacco, Cigars, and/or Pipes): No  Has this patient used any form of tobacco in the last 30 days? (Cigarettes, Smokeless Tobacco, Cigars, and/or Pipes) No  Mental Status Per Nursing Assessment::   On Admission:     Current Mental Status by Physician: patient denies SI/HI/AH/VH  Loss Factors: Poor social skills , poor communication skills  Historical Factors: Impulsivity  Risk Reduction Factors:   Positive social support  Continued Clinical Symptoms:  Patient to follow up with  psychiatrist as well as therapist for continued sx due to anxiety as well as autism.  Cognitive Features That Contribute To Risk:  Thought constriction (tunnel vision)    Suicide Risk:  Minimal: No identifiable suicidal ideation.  Patients presenting with no risk factors but with morbid ruminations; may be classified as minimal risk based on the severity of the depressive symptoms  Principal Problem: Generalized anxiety disorder Discharge Diagnoses:  Patient Active Problem List   Diagnosis Date Noted  . Adjustment disorder with mixed anxiety and depressed mood [F43.23] 08/01/2014  . Generalized anxiety disorder [F41.1] 08/01/2014  . Autism spectrum disorder [F84.0] 07/29/2014      Plan Of Care/Follow-up recommendations:  Activity:  No restrictions Diet:  regular Tests:  as needed Other:  follow up with after care  Is patient on multiple antipsychotic therapies at discharge:  No   Has Patient had three or more failed trials of antipsychotic monotherapy by history:  No  Recommended Plan for Multiple Antipsychotic Therapies: NA    Alixis Harmon MD 08/03/2014, 9:43 AM

## 2014-08-08 NOTE — Progress Notes (Signed)
Patient Discharge Instructions:  After Visit Summary (AVS):   Faxed to:  08/08/14 Discharge Summary Note:   Faxed to:  08/08/14 Psychiatric Admission Assessment Note:   Faxed to:  08/08/14 Suicide Risk Assessment - Discharge Assessment:   Faxed to:  08/08/14 Faxed/Sent to the Next Level Care provider:  08/08/14 [facility] [phone]  Jerelene ReddenSheena E [place], 08/08/2014, 2:57 PM

## 2014-09-19 ENCOUNTER — Ambulatory Visit (HOSPITAL_COMMUNITY)
Admission: EM | Admit: 2014-09-19 | Discharge: 2014-09-19 | Disposition: A | Discharge status: Home / Self Care | Payer: No Typology Code available for payment source | Source: Intra-hospital | Attending: Psychiatry | Admitting: Psychiatry

## 2014-09-19 ENCOUNTER — Emergency Department (HOSPITAL_COMMUNITY): Admission: EM | Admit: 2014-09-19 | Discharge: 2014-09-19 | Discharge status: Absent without leave | Payer: Self-pay

## 2014-09-19 DIAGNOSIS — Y998 Other external cause status: Secondary | ICD-10-CM | POA: Insufficient documentation

## 2014-09-19 DIAGNOSIS — X838XXA Intentional self-harm by other specified means, initial encounter: Secondary | ICD-10-CM | POA: Insufficient documentation

## 2014-09-19 DIAGNOSIS — I10 Essential (primary) hypertension: Secondary | ICD-10-CM | POA: Insufficient documentation

## 2014-09-19 DIAGNOSIS — Z043 Encounter for examination and observation following other accident: Secondary | ICD-10-CM | POA: Insufficient documentation

## 2014-09-19 DIAGNOSIS — Y9389 Activity, other specified: Secondary | ICD-10-CM | POA: Insufficient documentation

## 2014-09-19 DIAGNOSIS — Y929 Unspecified place or not applicable: Secondary | ICD-10-CM | POA: Insufficient documentation

## 2014-09-19 DIAGNOSIS — Z79899 Other long term (current) drug therapy: Secondary | ICD-10-CM | POA: Insufficient documentation

## 2014-09-20 ENCOUNTER — Emergency Department (HOSPITAL_COMMUNITY)
Admission: EM | Admit: 2014-09-20 | Discharge: 2014-09-21 | Disposition: A | Discharge status: Home / Self Care | Payer: No Typology Code available for payment source

## 2014-09-20 ENCOUNTER — Encounter (HOSPITAL_COMMUNITY): Payer: Self-pay

## 2014-09-20 DIAGNOSIS — R45851 Suicidal ideations: Secondary | ICD-10-CM

## 2014-09-20 DIAGNOSIS — F259 Schizoaffective disorder, unspecified: Secondary | ICD-10-CM | POA: Diagnosis present

## 2014-09-20 DIAGNOSIS — R443 Hallucinations, unspecified: Secondary | ICD-10-CM | POA: Diagnosis present

## 2014-09-20 DIAGNOSIS — F39 Unspecified mood [affective] disorder: Secondary | ICD-10-CM | POA: Diagnosis not present

## 2014-09-20 DIAGNOSIS — F840 Autistic disorder: Secondary | ICD-10-CM | POA: Diagnosis present

## 2014-09-20 DIAGNOSIS — F411 Generalized anxiety disorder: Secondary | ICD-10-CM | POA: Diagnosis present

## 2014-09-20 HISTORY — DX: Essential (primary) hypertension: I10

## 2014-09-20 LAB — CBC
HEMATOCRIT: 43.6 % (ref 39.0–52.0)
Hemoglobin: 15.4 g/dL (ref 13.0–17.0)
MCH: 30.3 pg (ref 26.0–34.0)
MCHC: 35.3 g/dL (ref 30.0–36.0)
MCV: 85.7 fL (ref 78.0–100.0)
Platelets: 313 10*3/uL (ref 150–400)
RBC: 5.09 MIL/uL (ref 4.22–5.81)
RDW: 13.4 % (ref 11.5–15.5)
WBC: 5.3 10*3/uL (ref 4.0–10.5)

## 2014-09-20 LAB — COMPREHENSIVE METABOLIC PANEL
ALT: 50 U/L (ref 17–63)
AST: 33 U/L (ref 15–41)
Albumin: 4.4 g/dL (ref 3.5–5.0)
Alkaline Phosphatase: 81 U/L (ref 38–126)
Anion gap: 8 (ref 5–15)
BUN: 10 mg/dL (ref 6–20)
CHLORIDE: 108 mmol/L (ref 101–111)
CO2: 24 mmol/L (ref 22–32)
Calcium: 9.4 mg/dL (ref 8.9–10.3)
Creatinine, Ser: 1 mg/dL (ref 0.61–1.24)
GFR calc non Af Amer: >60 mL/min (ref >60)
GLUCOSE: 129 mg/dL — AB (ref 65–99)
POTASSIUM: 3.5 mmol/L (ref 3.5–5.1)
Sodium: 140 mmol/L (ref 135–145)
TOTAL PROTEIN: 8 g/dL (ref 6.5–8.1)
Total Bilirubin: 0.5 mg/dL (ref 0.3–1.2)

## 2014-09-20 LAB — RAPID URINE DRUG SCREEN, HOSP PERFORMED
Amphetamines: NOT DETECTED
BENZODIAZEPINES: NOT DETECTED
Barbiturates: NOT DETECTED
Cocaine: NOT DETECTED
OPIATES: NOT DETECTED
TETRAHYDROCANNABINOL: NOT DETECTED

## 2014-09-20 LAB — ACETAMINOPHEN LEVEL

## 2014-09-20 LAB — ETHANOL: Alcohol, Ethyl (B): <5 mg/dL (ref <5)

## 2014-09-20 LAB — SALICYLATE LEVEL: Salicylate Lvl: <4 mg/dL (ref 2.8–30.0)

## 2014-09-20 MED ORDER — ZOLPIDEM TARTRATE 5 MG PO TABS: 5.0000 mg | ORAL_TABLET | Freq: Every evening | ORAL | Status: DC | PRN

## 2014-09-20 MED ORDER — TRIAMTERENE-HCTZ 37.5-25 MG PO TABS
1.0000 | ORAL_TABLET | Freq: Every day | ORAL | Status: DC
  Administered 2014-09-20 – 2014-09-21 (×2): 1 via ORAL
  Filled 2014-09-20 (×2): qty 1

## 2014-09-20 MED ORDER — BUSPIRONE HCL 10 MG PO TABS
10.0000 mg | ORAL_TABLET | Freq: Three times a day (TID) | ORAL | Status: DC
  Administered 2014-09-20 – 2014-09-21 (×3): 10 mg via ORAL
  Filled 2014-09-20 (×3): qty 1

## 2014-09-20 MED ORDER — LORAZEPAM 1 MG PO TABS: 1.0000 mg | ORAL_TABLET | Freq: Three times a day (TID) | ORAL | Status: DC | PRN

## 2014-09-20 MED ORDER — QUETIAPINE FUMARATE 50 MG PO TABS
50.0000 mg | ORAL_TABLET | Freq: Every day | ORAL | Status: DC
  Administered 2014-09-20: 50 mg via ORAL
  Filled 2014-09-20: qty 1

## 2014-09-20 MED ORDER — TRAZODONE HCL 50 MG PO TABS: 50.0000 mg | ORAL_TABLET | Freq: Every day | ORAL | Status: DC

## 2014-09-20 MED ORDER — NICOTINE 21 MG/24HR TD PT24
21.0000 mg | MEDICATED_PATCH | Freq: Every day | TRANSDERMAL | Status: DC
  Filled 2014-09-20 (×2): qty 1

## 2014-09-20 MED ORDER — TRAZODONE HCL 100 MG PO TABS
100.0000 mg | ORAL_TABLET | Freq: Every evening | ORAL | Status: DC | PRN
  Administered 2014-09-20: 100 mg via ORAL
  Filled 2014-09-20: qty 1

## 2014-09-20 MED ORDER — ONDANSETRON HCL 4 MG PO TABS: 4.0000 mg | ORAL_TABLET | Freq: Three times a day (TID) | ORAL | Status: DC | PRN

## 2014-09-20 MED ORDER — TRAZODONE HCL 100 MG PO TABS: 100.0000 mg | ORAL_TABLET | Freq: Every day | ORAL | Status: DC

## 2014-09-20 MED ORDER — ACETAMINOPHEN 325 MG PO TABS: 650.0000 mg | ORAL_TABLET | ORAL | Status: DC | PRN

## 2014-09-20 MED ORDER — BUSPIRONE HCL 5 MG PO TABS
7.5000 mg | ORAL_TABLET | Freq: Three times a day (TID) | ORAL | Status: DC
  Administered 2014-09-20: 7.5 mg via ORAL
  Filled 2014-09-20 (×3): qty 1.5

## 2014-09-20 MED ORDER — IBUPROFEN 200 MG PO TABS: 600.0000 mg | ORAL_TABLET | Freq: Three times a day (TID) | ORAL | Status: DC | PRN

## 2014-09-20 MED ORDER — SERTRALINE HCL 50 MG PO TABS
50.0000 mg | ORAL_TABLET | Freq: Every day | ORAL | Status: DC
  Administered 2014-09-20 – 2014-09-21 (×2): 50 mg via ORAL
  Filled 2014-09-20 (×2): qty 1

## 2014-09-20 NOTE — ED Notes (Signed)
Pt here voluntarily with his mother, pt is mumbling and saying that he's done with life, he states that he couldn't hurt anyone else but himself. Prior to coming in the ED, pt's mom states that he was beating himself in the head and slapping himself.

## 2014-09-20 NOTE — ED Notes (Signed)
Pt resting on stretcher. Awake, alert & responsive, no distress noted.  Monitoring for safety, Q 15 min checks in effect.

## 2014-09-20 NOTE — ED Notes (Signed)
Pt presents with complaint of talking to self.  Reported in triage he is done with life.  Denies SI, HI.  When questioned about AV hallucinations shakes his head yes, but says no.  Denies drug use or alcohol use.  Pt reports he takes meds for Depression and Anxiety.  RN observed pt difficult to focus on questions asked and takes a few seconds or pause to answer questions.  Pt states he feels hopeless.  Monitoring for safety, Q 15 min checks in effect, no distress noted, calm & cooperative.

## 2014-09-20 NOTE — BH Assessment (Signed)
Tele Assessment Note   Jacob Price is a 29 y.o. male who presents as a walk-in to [facility], accompanied by his mother.  Pt is autistic.  Pt.'s mother states that pt has refused his anxiety medication for the past 3 nights and has not been sleeping for the past 3 nights. Pt is speaking loudly with rapid and pressured speech and has his eyes closed with his head facing the wall.  Per mother, pt has been making threats to harm himself but has no plan or intent to follow through with a plan.  Mother states that pt threatened to hit himself in the head with a pizza box and pt admits slapping himself in the head.  Pt reports the following: he's receiving "bad accusations from [employer]".  The media site is calling him "major derogatory stuff" and is obsessing over words on digital screens about me" and now he feels his life is not going to be the same after what he has seen on You tube.  Pt denies SI/HI/SA   Axis I: Autism spectrum disorder Axis II: Deferred Axis III:  Past Medical History  Diagnosis Date  . Hypertension    Axis IV: other psychosocial or environmental problems, problems related to social environment and problems with primary support group Axis V: 21-30 behavior considerably influenced by delusions or hallucinations OR serious impairment in judgment, communication OR inability to function in almost all areas  Past Medical History: No past medical history on file.  No past surgical history on file.  Family History: No family history on file.  Social History:  reports that he has never smoked. He does not have any smokeless tobacco history on file. He reports that he does not drink alcohol or use illicit drugs.  Additional Social History:  Alcohol / Drug Use Pain Medications: See MAR  Prescriptions: See MAR  Over the Counter: See MAR  History of alcohol / drug use?: No history of alcohol / drug abuse Longest period of sobriety (when/how long): None   CIWA:   COWS:    PATIENT  STRENGTHS: (choose at least two) Supportive family/friends  Allergies: No Known Allergies  Home Medications:  (Not in a hospital admission)  OB/GYN Status:  No LMP for male patient.  General Assessment Data Location of Assessment: [facility] Assessment Services TTS Assessment: In system Is this a Tele or Face-to-Face Assessment?: Face-to-Face Is this an Initial Assessment or a Re-assessment for this encounter?: Initial Assessment Marital status: Single Maiden name: None  Is patient pregnant?: No Pregnancy Status: No Living Arrangements: Alone Can pt return to current living arrangement?: Yes Admission Status: Voluntary Is patient capable of signing voluntary admission?: No Referral Source: MD Insurance type: [place]   Medical Screening Exam [facility] Walk-in ONLY) Medical Exam completed: No Reason for MSE not completed: Other: (None )  Crisis Care Plan Living Arrangements: Alone Name of Psychiatrist: None  Name of Therapist: None   Education Status Is patient currently in school?: No Current Grade: None  Highest grade of school patient has completed: 12 Name of school: None  Contact person: None   Risk to self with the past 6 months Suicidal Ideation: No Has patient been a risk to self within the past 6 months prior to admission? : No Suicidal Intent: No Has patient had any suicidal intent within the past 6 months prior to admission? : No Is patient at risk for suicide?: No Suicidal Plan?: No Has patient had any suicidal plan within the past 6 months prior  to admission? : No Access to Means: No What has been your use of drugs/alcohol within the last 12 months?: None  Previous Attempts/Gestures: No How many times?: 0 Other Self Harm Risks: None  Triggers for Past Attempts: None known Intentional Self Injurious Behavior: None Family Suicide History: No Recent stressful life event(s): Other (Comment) (Refusing anxiety meds x3 days ) Persecutory voices/beliefs?:  Yes Depression: Yes Depression Symptoms: Insomnia Substance abuse history and/or treatment for substance abuse?: No Suicide prevention information given to non-admitted patients: Not applicable  Risk to Others within the past 6 months Homicidal Ideation: No Does patient have any lifetime risk of violence toward others beyond the six months prior to admission? : No Thoughts of Harm to Others: No Current Homicidal Intent: No Current Homicidal Plan: No Access to Homicidal Means: No Identified Victim: None  History of harm to others?: No Assessment of Violence: None Noted Violent Behavior Description: None  Does patient have access to weapons?: No Criminal Charges Pending?: No Does patient have a court date: No Is patient on probation?: No  Psychosis Hallucinations: Auditory Delusions: Persecutory  Mental Status Report Appearance/Hygiene: Unremarkable Eye Contact: Poor Motor Activity: Unremarkable Speech: Rapid, Pressured, Loud Level of Consciousness: Alert Mood: Anxious, Preoccupied Affect: Anxious, Preoccupied Anxiety Level: Moderate Thought Processes: Tangential Judgement: Impaired Orientation: Person, Place, Situation Obsessive Compulsive Thoughts/Behaviors: Moderate  Cognitive Functioning Concentration: Decreased Memory: Recent Intact, Remote Intact IQ: Average Insight: Fair Impulse Control: Fair Appetite: Good Weight Loss: 0 Weight Gain: 0 Sleep: Decreased Total Hours of Sleep:  (No sleep x3days ) Vegetative Symptoms: None  ADLScreening [facility] Assessment Services) Patient's cognitive ability adequate to safely complete daily activities?: Yes Patient able to express need for assistance with ADLs?: Yes Independently performs ADLs?: Yes (appropriate for developmental age)  Prior Inpatient Therapy Prior Inpatient Therapy: Yes Prior Therapy Dates: 2016 [facility]  Reason for Treatment: Depression/SI   Prior Outpatient  Therapy Prior Outpatient Therapy: No Prior Therapy Dates: None  Prior Therapy Facilty/Provider(s): None  Reason for Treatment: None  Does patient have an ACCT team?: No Does patient have Intensive In-House Services?  : No Does patient have [facility] services? : No Does patient have P4CC services?: No  ADL Screening (condition at time of admission) Patient's cognitive ability adequate to safely complete daily activities?: Yes Is the patient deaf or have difficulty hearing?: No Does the patient have difficulty seeing, even when wearing glasses/contacts?: No Does the patient have difficulty concentrating, remembering, or making decisions?: Yes Patient able to express need for assistance with ADLs?: Yes Does the patient have difficulty dressing or bathing?: No Independently performs ADLs?: Yes (appropriate for developmental age) Does the patient have difficulty walking or climbing stairs?: No Weakness of Legs: None Weakness of Arms/Hands: None  Home Assistive Devices/Equipment Home Assistive Devices/Equipment: None  Therapy Consults (therapy consults require a physician order) PT Evaluation Needed: No OT Evalulation Needed: No SLP Evaluation Needed: No Abuse/Neglect Assessment (Assessment to be complete while patient is alone) Physical Abuse: Denies Verbal Abuse: Denies Sexual Abuse: Denies Exploitation of patient/patient's resources: Denies Self-Neglect: Denies Values / Beliefs Cultural Requests During Hospitalization: None Spiritual Requests During Hospitalization: None Consults Spiritual Care Consult Needed: No Social Work Consult Needed: No Merchant navy officerAdvance Directives (For Healthcare) Does patient have an advance directive?: No Would patient like information on creating an advanced directive?: No - patient declined information    Additional Information 1:1 In Past 12 Months?: No CIRT Risk: No Elopement Risk: No Does patient have medical clearance?: No (Walk In.  Sent to [facility] for  Med Clear )     Disposition:  Disposition Initial Assessment Completed for this Encounter: Yes Disposition of Patient: Referred to (Per Donell Sievert, PA--AM Psych eval for final dispo ) Patient referred to: Other (Comment) (Per Donell Sievert, PA--AM psych eval for final dispo)  Murrell Redden 09/20/2014 12:17 AM

## 2014-09-20 NOTE — ED Provider Notes (Signed)
CSN: [id number]     Arrival date & time 09/19/14  2349 History   First MD Initiated Contact with Patient 09/20/14 0055     Chief Complaint  Patient presents with  . Suicidal   (Consider location/radiation/quality/duration/timing/severity/associated sxs/prior Treatment) HPI Jacob Price is a 29 year old male presenting with suicidal ideation and self injury. Mother states over the last 3 days he has been mumbling under his breath, or talking about feeling hopeless and that he is "done with life ". Mom states she became concerned because he began hitting himself finally in the head and she was afraid he would hurt himself. He denies thoughts of hurting anyone else. He states he doesn't even want to plate video games or watch TV and no one in the family understands when he tries to tell them. He currently denies any headache or neck pain. He denies any alcohol intake, smoking any cigarettes, or any recreational drug use. He denies any pain, fevers, chest pain, abdominal pain, nausea or vomiting or shortness of breath.   Past Medical History  Diagnosis Date  . Hypertension    History reviewed. No pertinent past surgical history. History reviewed. No pertinent family history. History  Substance Use Topics  . Smoking status: Never Smoker   . Smokeless tobacco: Not on file  . Alcohol Use: No    Review of Systems  Constitutional: Negative for fever and chills.  HENT: Negative for sore throat.   Eyes: Negative for visual disturbance.  Respiratory: Negative for cough and shortness of breath.   Cardiovascular: Negative for chest pain and leg swelling.  Gastrointestinal: Negative for nausea, vomiting and diarrhea.  Genitourinary: Negative for dysuria.  Musculoskeletal: Negative for myalgias.  Skin: Negative for rash.  Neurological: Negative for weakness, numbness and headaches.  Psychiatric/Behavioral: Positive for suicidal ideas and self-injury.      Allergies  Review of patient's  allergies indicates no known allergies.  Home Medications   Prior to Admission medications   Medication Sig Start Date End Date Taking? Authorizing Provider  busPIRone (BUSPAR) 7.5 MG tablet Take 1 tablet (7.5 mg total) by mouth 3 (three) times daily. 08/03/14  Yes Adonis BrookSheila Agustin, NP  sertraline (ZOLOFT) 50 MG tablet Take 1 tablet (50 mg total) by mouth daily. 08/03/14  Yes Adonis BrookSheila Agustin, NP  traZODone (DESYREL) 50 MG tablet Take 1 tablet (50 mg total) by mouth at bedtime. 08/03/14  Yes Adonis BrookSheila Agustin, NP  triamterene-hydrochlorothiazide (MAXZIDE-25) 37.5-25 MG per tablet Take 1 tablet by mouth daily. 08/03/14  Yes Adonis BrookSheila Agustin, NP   BP 152/101 mmHg  Pulse 112  Temp(Src) 98.2 F (36.8 C) (Oral)  Resp 23  Ht 5\' 11"  (1.803 m)  Wt 280 lb (127.007 kg)  BMI 39.07 kg/m2  SpO2 96% Physical Exam  Constitutional: He is oriented to person, place, and time. He appears well-developed and well-nourished. No distress.  HENT:  Head: Normocephalic and atraumatic.  Mouth/Throat: Oropharynx is clear and moist. No oropharyngeal exudate.  Eyes: Conjunctivae are normal.  Neck: Neck supple. No thyromegaly present.  Cardiovascular: Normal rate, regular rhythm and intact distal pulses.   Pulmonary/Chest: Effort normal and breath sounds normal. No respiratory distress. He has no wheezes. He has no rales. He exhibits no tenderness.  Abdominal: Soft. There is no tenderness.  Musculoskeletal: He exhibits no tenderness.  Lymphadenopathy:    He has no cervical adenopathy.  Neurological: He is alert and oriented to person, place, and time.  Skin: Skin is warm and dry. No rash noted. He is not  diaphoretic.  Psychiatric: His affect is blunt. His speech is delayed. He is withdrawn. He expresses suicidal ideation. He expresses no homicidal ideation. He expresses no suicidal plans and no homicidal plans.  Nursing note and vitals reviewed.   ED Course  Procedures (including critical care time) Labs Review Labs  Reviewed  ACETAMINOPHEN LEVEL - Abnormal; Notable for the following:    Acetaminophen (Tylenol), Serum <10 (*)    All other components within normal limits  COMPREHENSIVE METABOLIC PANEL - Abnormal; Notable for the following:    Glucose, Bld 129 (*)    All other components within normal limits  CBC  ETHANOL  SALICYLATE LEVEL  URINE RAPID DRUG SCREEN (HOSP PERFORMED)    Imaging Review No results found.   EKG Interpretation None      MDM   Final diagnoses:  Suicidal ideation   Patient has been medically cleared in the ED and is awaiting consult by TTS team for possible placement. Pt is currently having SI but not HI and appears stable in NAD. Pt is cleared to be moved back to [facility].    Filed Vitals:   09/20/14 0020  BP: 152/101  Pulse: 112  Temp: 98.2 F (36.8 C)  TempSrc: Oral  Resp: 23  Height:  (1.803 m)  Weight: 280 lb (127.007 kg)  SpO2: 96%   Meds given in ED:  Medications  LORazepam (ATIVAN) tablet 1 mg (not administered)  acetaminophen (TYLENOL) tablet 650 mg (not administered)  ibuprofen (ADVIL,MOTRIN) tablet 600 mg (not administered)  zolpidem (AMBIEN) tablet 5 mg (not administered)  nicotine (NICODERM CQ - dosed in mg/24 hours) patch 21 mg (not administered)  ondansetron (ZOFRAN) tablet 4 mg (not administered)  busPIRone (BUSPAR) tablet 7.5 mg (not administered)  sertraline (ZOLOFT) tablet 50 mg (not administered)  traZODone (DESYREL) tablet 50 mg (not administered)  triamterene-hydrochlorothiazide (MAXZIDE-25) 37.5-25 MG per tablet 1 tablet (not administered)    New Prescriptions   No medications on file       Harle Battiest, NP 09/21/14 1610  Tomasita Crumble, MD 09/21/14 [phone]

## 2014-09-20 NOTE — Consult Note (Signed)
[facility] Consult   Reason for Consult:  Psychosis Referring Physician:  EDP Patient Identification: Jacob Price MRN:  [id number] Principal Diagnosis: <principal problem not specified> Diagnosis:   Patient Active Problem List   Diagnosis Date Noted  . Schizoaffective disorder [F25.9] 09/20/2014    Priority: High  . Generalized anxiety disorder [F41.1] 08/01/2014    Priority: High  . Autism spectrum disorder [F84.0] 07/29/2014    Priority: High    Total Time spent with patient: 45 minutes  Subjective:   Jacob Price is a 29 y.o. male patient admitted with psychosis.  HPI:  The patient has been hering Youtube making accusations that he "can't take anymore."  These accusations are voices he hears that are derogative and demeaning.  He presents with poor eye contact, slow to respond with long pauses, often talking to himself/others in his room.  Jacob Price is experiencing hallucinations that are extremely irritating to him.  He is calm and cooperative.   Denies suicidal/homicidal ideations and alcohol/drug abuse.   HPI Elements:   Location:  generalized. Quality:  acute . Severity:  severe. Timing:  constant. Duration:  few days. Context:  stressors.  Past Medical History:  Past Medical History  Diagnosis Date  . Hypertension    History reviewed. No pertinent past surgical history. Family History: History reviewed. No pertinent family history. Social History:  History  Alcohol Use No     History  Drug Use No    History   Social History  . Marital Status: Married    Spouse Name: N/A  . Number of Children: N/A  . Years of Education: N/A   Social History Main Topics  . Smoking status: Never Smoker   . Smokeless tobacco: Not on file  . Alcohol Use: No  . Drug Use: No  . Sexual Activity: Not Currently   Other Topics Concern  . None   Social History Narrative   Additional Social History:                          Allergies:  No  Known Allergies  Labs:  Results for orders placed or performed during the hospital encounter of 09/20/14 (from the past 48 hour(s))  Acetaminophen level     Status: Abnormal   Collection Time: 09/20/14  1:08 AM  Result Value Ref Range   Acetaminophen (Tylenol), Serum <10 (L) 10 - 30 ug/mL    Comment:        THERAPEUTIC CONCENTRATIONS VARY SIGNIFICANTLY. A RANGE OF 10-30 ug/mL MAY BE AN EFFECTIVE CONCENTRATION FOR MANY PATIENTS. HOWEVER, SOME ARE BEST TREATED AT CONCENTRATIONS OUTSIDE THIS RANGE. ACETAMINOPHEN CONCENTRATIONS >150 ug/mL AT 4 HOURS AFTER INGESTION AND >50 ug/mL AT 12 HOURS AFTER INGESTION ARE OFTEN ASSOCIATED WITH TOXIC REACTIONS.   CBC     Status: None   Collection Time: 09/20/14  1:08 AM  Result Value Ref Range   WBC 5.3 4.0 - 10.5 K/uL   RBC 5.09 4.22 - 5.81 MIL/uL   Hemoglobin 15.4 13.0 - 17.0 g/dL   HCT 43.6 39.0 - 52.0 %   MCV 85.7 78.0 - 100.0 fL   MCH 30.3 26.0 - 34.0 pg   MCHC 35.3 30.0 - 36.0 g/dL   RDW 13.4 11.5 - 15.5 %   Platelets 313 150 - 400 K/uL  Comprehensive metabolic panel     Status: Abnormal   Collection Time: 09/20/14  1:08 AM  Result Value Ref Range   Sodium 140 135 -  145 mmol/L   Potassium 3.5 3.5 - 5.1 mmol/L   Chloride 108 101 - 111 mmol/L   CO2 24 22 - 32 mmol/L   Glucose, Bld 129 (H) 65 - 99 mg/dL   BUN 10 6 - 20 mg/dL   Creatinine, Ser 1.00 0.61 - 1.24 mg/dL   Calcium 9.4 8.9 - 10.3 mg/dL   Total Protein 8.0 6.5 - 8.1 g/dL   Albumin 4.4 3.5 - 5.0 g/dL   AST 33 15 - 41 U/L   ALT 50 17 - 63 U/L   Alkaline Phosphatase 81 38 - 126 U/L   Total Bilirubin 0.5 0.3 - 1.2 mg/dL   GFR calc non Af Amer >60 >60 mL/min   GFR calc Af Amer >60 >60 mL/min    Comment: (NOTE) The eGFR has been calculated using the CKD EPI equation. This calculation has not been validated in all clinical situations. eGFR's persistently <60 mL/min signify possible Chronic Kidney Disease.    Anion gap 8 5 - 15  Ethanol (ETOH)     Status: None    Collection Time: 09/20/14  1:08 AM  Result Value Ref Range   Alcohol, Ethyl (B) <5 <5 mg/dL    Comment:        LOWEST DETECTABLE LIMIT FOR SERUM ALCOHOL IS 11 mg/dL FOR MEDICAL PURPOSES ONLY   Salicylate level     Status: None   Collection Time: 09/20/14  1:08 AM  Result Value Ref Range   Salicylate Lvl <3.8 2.8 - 30.0 mg/dL  Urine Drug Screen     Status: None   Collection Time: 09/20/14  1:52 AM  Result Value Ref Range   Opiates NONE DETECTED NONE DETECTED   Cocaine NONE DETECTED NONE DETECTED   Benzodiazepines NONE DETECTED NONE DETECTED   Amphetamines NONE DETECTED NONE DETECTED   Tetrahydrocannabinol NONE DETECTED NONE DETECTED   Barbiturates NONE DETECTED NONE DETECTED    Comment:        DRUG SCREEN FOR MEDICAL PURPOSES ONLY.  IF CONFIRMATION IS NEEDED FOR ANY PURPOSE, NOTIFY LAB WITHIN 5 DAYS.        LOWEST DETECTABLE LIMITS FOR URINE DRUG SCREEN Drug Class       Cutoff (ng/mL) Amphetamine      1000 Barbiturate      200 Benzodiazepine   756 Tricyclics       433 Opiates          300 Cocaine          300 THC              50     Vitals: Blood pressure 131/83, pulse 70, temperature 97.6 F (36.4 C), temperature source Oral, resp. rate 18, height _0  (1.803 m), weight 127.007 kg (280 lb), SpO2 100 %.  Risk to Self: Is patient at risk for suicide?: Yes Risk to Others:   Prior Inpatient Therapy:   Prior Outpatient Therapy:    Current Facility-Administered Medications  Medication Dose Route Frequency Provider Last Rate Last Dose  . acetaminophen (TYLENOL) tablet 650 mg  650 mg Oral Q4H PRN Britt Bottom, NP      . busPIRone (BUSPAR) tablet 10 mg  10 mg Oral TID Endora Teresi      . ibuprofen (ADVIL,MOTRIN) tablet 600 mg  600 mg Oral Q8H PRN Britt Bottom, NP      . LORazepam (ATIVAN) tablet 1 mg  1 mg Oral Q8H PRN Britt Bottom, NP      . nicotine (NICODERM CQ -  dosed in mg/24 hours) patch 21 mg  21 mg Transdermal Daily Britt Bottom, NP    21 mg at 09/20/14 1024  . ondansetron (ZOFRAN) tablet 4 mg  4 mg Oral Q8H PRN Britt Bottom, NP      . QUEtiapine (SEROQUEL) tablet 50 mg  50 mg Oral QHS Master Touchet      . sertraline (ZOLOFT) tablet 50 mg  50 mg Oral Daily Britt Bottom, NP   50 mg at 09/20/14 1018  . traZODone (DESYREL) tablet 100 mg  100 mg Oral QHS PRN Tinika Bucknam      . triamterene-hydrochlorothiazide (MAXZIDE-25) 37.5-25 MG per tablet 1 tablet  1 tablet Oral Daily Britt Bottom, NP   1 tablet at 09/20/14 1017   Current Outpatient Prescriptions  Medication Sig Dispense Refill  . busPIRone (BUSPAR) 7.5 MG tablet Take 1 tablet (7.5 mg total) by mouth 3 (three) times daily. 90 tablet 0  . sertraline (ZOLOFT) 50 MG tablet Take 1 tablet (50 mg total) by mouth daily. 30 tablet 0  . traZODone (DESYREL) 50 MG tablet Take 1 tablet (50 mg total) by mouth at bedtime. 30 tablet 0  . triamterene-hydrochlorothiazide (MAXZIDE-25) 37.5-25 MG per tablet Take 1 tablet by mouth daily. 30 tablet 0    Musculoskeletal: Strength & Muscle Tone: within normal limits Gait & Station: normal Patient leans: N/A  Psychiatric Specialty Exam: Physical Exam  Review of Systems  Constitutional: Negative.   HENT: Negative.   Eyes: Negative.   Respiratory: Negative.   Cardiovascular: Negative.   Gastrointestinal: Negative.   Genitourinary: Negative.   Musculoskeletal: Negative.   Skin: Negative.   Neurological: Negative.   Endo/Heme/Allergies: Negative.   Psychiatric/Behavioral: Positive for hallucinations. The patient is nervous/anxious.     Blood pressure 131/83, pulse 70, temperature 97.6 F (36.4 C), temperature source Oral, resp. rate 18, height _0  (1.803 m), weight 127.007 kg (280 lb), SpO2 100 %.Body mass index is 39.07 kg/(m^2).  General Appearance: Casual  Eye Contact::  Minimal  Speech:  Slow  Volume:  Normal  Mood:  Anxious  Affect:  Blunt  Thought Process:  Coherent  Orientation:  Full (Time,  Place, and Person)  Thought Content:  Hallucinations: Auditory Visual  Suicidal Thoughts:  No  Homicidal Thoughts:  No  Memory:  Immediate;   Fair Recent;   Fair Remote;   Fair  Judgement:  Impaired  Insight:  Fair  Psychomotor Activity:  Decreased  Concentration:  Fair  Recall:  [employer] of Knowledge:Fair  Language: Fair  Akathisia:  No  Handed:  Right  AIMS (if indicated):     Assets:  Housing Leisure Time Physical Health Resilience Social Support  ADL's:  Intact  Cognition: WNL  Sleep:      Medical Decision Making: Review of Psycho-Social Stressors (1), Review or order clinical lab tests (1) and Review of Medication Regimen & Side Effects (2)  Treatment Plan Summary: Daily contact with patient to assess and evaluate symptoms and progress in treatment, Medication management and Plan admit to inpatient psychiatric unit for stabilization  Plan:  Recommend psychiatric Inpatient admission when medically cleared. Disposition: Admit to inpatient psychiatric unit for stabilization  Waylan Boga, PMH-NP 09/20/2014 1:11 PM Patient seen face-to-face for psychiatric evaluation, chart reviewed and case discussed with the physician extender and developed treatment plan. Reviewed the information documented and agree with the treatment plan. Corena Pilgrim, MD

## 2014-09-20 NOTE — BHH Counselor (Signed)
BHH Assessment Progress Note   Patient referral information for inpatient consideration has been sent, via fax, to [facility], [facility], [facility], [facility], [facility] [facility] [place], [facility] and [facility].   [place] City[place]antha M. Ladona Ridgelaylor, MS, NCC Observation Counselor

## 2014-09-21 DIAGNOSIS — F259 Schizoaffective disorder, unspecified: Secondary | ICD-10-CM | POA: Diagnosis not present

## 2014-09-21 MED ORDER — OLANZAPINE 10 MG PO TBDP
10.0000 mg | ORAL_TABLET | Freq: Three times a day (TID) | ORAL | Status: DC | PRN
Start: 2014-09-21 — End: 2014-09-21

## 2014-09-21 MED ORDER — BUSPIRONE HCL 10 MG PO TABS: 10.0000 mg | ORAL_TABLET | Freq: Two times a day (BID) | ORAL | Status: DC

## 2014-09-21 NOTE — Progress Notes (Signed)
CM spoke with pt who [place].  CM discussed and provided written information for self pay pcps, discussed the importance of pcp vs EDP services for f/u care, www.needymeds.org, www.goodrx.com, discounted pharmacies and other [place] resources such as [facility] , [facility], affordable care act,  [place] med assist, financial assistance, self pay dental services, [place] med assist, DSS and  health department  Reviewed resources for [place] self pay pcps like [facility], family medicine at [address], [facility], [facility], local urgent care centers, [facility], [facility], general medical clinics, [facility] of [facility], [facility] plus others, medication resources, [facility] out patient [facility]ies and housing Pt voiced understanding and appreciation of resources provided   Provided [facility] contact information dx Schizoaffective disorder Pt answering CM questions with lots of "yes", "I don't know" responses Cm unable to confirm if pt could comprehend most resources offered

## 2014-09-21 NOTE — BH Assessment (Signed)
BHH Assessment Progress Note  At 12:37 this Clinical research associatewriter spoke to [place] at [facility].  Pt has been accepted to their facility by Dr Jeannine KittenFarah contingent upon pt being sent under IVC.  She will also need pt's full Social Security number.  Also, pt is not to be transported until after 15:00.  Report is to be called to [phone] no sooner than 14:30.  This Clinical research associatewriter will discuss initiating IVC with the EDP.  Dahlia ByesJosephine Onuoha, NP believes pt meets criteria for IVC, and is in agreement with this decision.  Doylene Canninghomas Sury Wentworth, MA Triage Specialist [phone]

## 2014-09-21 NOTE — Consult Note (Signed)
BHH Face-to-Face Psychiatry Consult   Reason for Consult:  Psychosis Referring Physician:  EDP Patient Identification: Jacob Price MRN:  [id number] Principal Diagnosis: Schizoaffective disorder Diagnosis:   Patient Active Problem List   Diagnosis Date Noted  . Schizoaffective disorder [F25.9] 09/20/2014  . Hallucinations [R44.3] 09/20/2014  . Generalized anxiety disorder [F41.1] 08/01/2014  . Autism spectrum disorder [F84.0] 07/29/2014    Total Time spent with patient: 30 minutes  Subjective:   Jacob Price is a 28 y.o. male patient admitted with psychosis.  HPI:  The patient has been hering Youtube making accusations that he "can't take anymore."  These accusations are voices he hears that are derogative and demeaning.  He presents with poor eye contact, slow to respond with long pauses, often talking to himself/others in his room.  Jacob Price is experiencing hallucinations that are extremely irritating to him.  He is calm and cooperative.   Denies suicidal/homicidal ideations and alcohol/drug abuse.    Accepted at [facility] and is waiting for transportation.  Patient remains disorganized and not able to explain why he is in the hospital.  Patient could not answer any question without being prompted several times to speak and he did not make eye contact with providers.  He will be discharged and will be waiting for transportation.  HPI Elements:   Location:  generalized. Quality:  acute . Severity:  severe. Timing:  constant. Duration:  few days. Context:  stressors.  Past Medical History:  Past Medical History  Diagnosis Date  . Hypertension    History reviewed. No pertinent past surgical history. Family History: History reviewed. No pertinent family history. Social History:  History  Alcohol Use No     History  Drug Use No    History   Social History  . Marital Status: Married    Spouse Name: N/A  . Number of Children: N/A  . Years of Education: N/A   Social  History Main Topics  . Smoking status: Never Smoker   . Smokeless tobacco: Not on file  . Alcohol Use: No  . Drug Use: No  . Sexual Activity: Not Currently   Other Topics Concern  . None   Social History Narrative   Additional Social History:                          Allergies:  No Known Allergies  Labs:  Results for orders placed or performed during the hospital encounter of 09/20/14 (from the past 48 hour(s))  Acetaminophen level     Status: Abnormal   Collection Time: 09/20/14  1:08 AM  Result Value Ref Range   Acetaminophen (Tylenol), Serum <10 (L) 10 - 30 ug/mL    Comment:        THERAPEUTIC CONCENTRATIONS VARY SIGNIFICANTLY. A RANGE OF 10-30 ug/mL MAY BE AN EFFECTIVE CONCENTRATION FOR MANY PATIENTS. HOWEVER, SOME ARE BEST TREATED AT CONCENTRATIONS OUTSIDE THIS RANGE. ACETAMINOPHEN CONCENTRATIONS >150 ug/mL AT 4 HOURS AFTER INGESTION AND >50 ug/mL AT 12 HOURS AFTER INGESTION ARE OFTEN ASSOCIATED WITH TOXIC REACTIONS.   CBC     Status: None   Collection Time: 09/20/14  1:08 AM  Result Value Ref Range   WBC 5.3 4.0 - 10.5 K/uL   RBC 5.09 4.22 - 5.81 MIL/uL   Hemoglobin 15.4 13.0 - 17.0 g/dL   HCT 43.6 39.0 - 52.0 %   MCV 85.7 78.0 - 100.0 fL   MCH 30.3 26.0 - 34.0 pg   MCHC 35.3 30.0 -   36.0 g/dL   RDW 13.4 11.5 - 15.5 %   Platelets 313 150 - 400 K/uL  Comprehensive metabolic panel     Status: Abnormal   Collection Time: 09/20/14  1:08 AM  Result Value Ref Range   Sodium 140 135 - 145 mmol/L   Potassium 3.5 3.5 - 5.1 mmol/L   Chloride 108 101 - 111 mmol/L   CO2 24 22 - 32 mmol/L   Glucose, Bld 129 (H) 65 - 99 mg/dL   BUN 10 6 - 20 mg/dL   Creatinine, Ser 1.00 0.61 - 1.24 mg/dL   Calcium 9.4 8.9 - 10.3 mg/dL   Total Protein 8.0 6.5 - 8.1 g/dL   Albumin 4.4 3.5 - 5.0 g/dL   AST 33 15 - 41 U/L   ALT 50 17 - 63 U/L   Alkaline Phosphatase 81 38 - 126 U/L   Total Bilirubin 0.5 0.3 - 1.2 mg/dL   GFR calc non Af Amer >60 >60 mL/min   GFR calc Af  Amer >60 >60 mL/min    Comment: (NOTE) The eGFR has been calculated using the CKD EPI equation. This calculation has not been validated in all clinical situations. eGFR's persistently <60 mL/min signify possible Chronic Kidney Disease.    Anion gap 8 5 - 15  Ethanol (ETOH)     Status: None   Collection Time: 09/20/14  1:08 AM  Result Value Ref Range   Alcohol, Ethyl (B) <5 <5 mg/dL    Comment:        LOWEST DETECTABLE LIMIT FOR SERUM ALCOHOL IS 11 mg/dL FOR MEDICAL PURPOSES ONLY   Salicylate level     Status: None   Collection Time: 09/20/14  1:08 AM  Result Value Ref Range   Salicylate Lvl <2.9 2.8 - 30.0 mg/dL  Urine Drug Screen     Status: None   Collection Time: 09/20/14  1:52 AM  Result Value Ref Range   Opiates NONE DETECTED NONE DETECTED   Cocaine NONE DETECTED NONE DETECTED   Benzodiazepines NONE DETECTED NONE DETECTED   Amphetamines NONE DETECTED NONE DETECTED   Tetrahydrocannabinol NONE DETECTED NONE DETECTED   Barbiturates NONE DETECTED NONE DETECTED    Comment:        DRUG SCREEN FOR MEDICAL PURPOSES ONLY.  IF CONFIRMATION IS NEEDED FOR ANY PURPOSE, NOTIFY LAB WITHIN 5 DAYS.        LOWEST DETECTABLE LIMITS FOR URINE DRUG SCREEN Drug Class       Cutoff (ng/mL) Amphetamine      1000 Barbiturate      200 Benzodiazepine   191 Tricyclics       660 Opiates          300 Cocaine          300 THC              50     Vitals: Blood pressure 102/57, pulse 60, temperature 97.8 F (36.6 C), temperature source Oral, resp. rate 18, height 5' 11" (1.803 m), weight 127.007 kg (280 lb), SpO2 100 %.  Risk to Self: Is patient at risk for suicide?: Yes Risk to Others:   Prior Inpatient Therapy:   Prior Outpatient Therapy:    Current Facility-Administered Medications  Medication Dose Route Frequency Provider Last Rate Last Dose  . acetaminophen (TYLENOL) tablet 650 mg  650 mg Oral Q4H PRN Britt Bottom, NP      . busPIRone (BUSPAR) tablet 10 mg  10 mg Oral BID  Joh Rao      .  ibuprofen (ADVIL,MOTRIN) tablet 600 mg  600 mg Oral Q8H PRN Britt Bottom, NP      . OLANZapine zydis (ZYPREXA) disintegrating tablet 10 mg  10 mg Oral Q8H PRN Brieanna Nau      . ondansetron (ZOFRAN) tablet 4 mg  4 mg Oral Q8H PRN Britt Bottom, NP      . QUEtiapine (SEROQUEL) tablet 50 mg  50 mg Oral QHS Raivyn Kabler   50 mg at 09/20/14 2208  . sertraline (ZOLOFT) tablet 50 mg  50 mg Oral Daily Britt Bottom, NP   50 mg at 09/21/14 0959  . traZODone (DESYREL) tablet 100 mg  100 mg Oral QHS PRN Kataleah Bejar   100 mg at 09/20/14 2208  . triamterene-hydrochlorothiazide (MAXZIDE-25) 37.5-25 MG per tablet 1 tablet  1 tablet Oral Daily Britt Bottom, NP   1 tablet at 09/21/14 1660   Current Outpatient Prescriptions  Medication Sig Dispense Refill  . busPIRone (BUSPAR) 7.5 MG tablet Take 1 tablet (7.5 mg total) by mouth 3 (three) times daily. 90 tablet 0  . sertraline (ZOLOFT) 50 MG tablet Take 1 tablet (50 mg total) by mouth daily. 30 tablet 0  . traZODone (DESYREL) 50 MG tablet Take 1 tablet (50 mg total) by mouth at bedtime. 30 tablet 0  . triamterene-hydrochlorothiazide (MAXZIDE-25) 37.5-25 MG per tablet Take 1 tablet by mouth daily. 30 tablet 0    Musculoskeletal: Strength & Muscle Tone: within normal limits Gait & Station: normal Patient leans: N/A  Psychiatric Specialty Exam: Physical Exam  ROS  Blood pressure 102/57, pulse 60, temperature 97.8 F (36.6 C), temperature source Oral, resp. rate 18, height 5' 11" (1.803 m), weight 127.007 kg (280 lb), SpO2 100 %.Body mass index is 39.07 kg/(m^2).  General Appearance: Casual  Eye Contact::  Minimal  Speech:  Slow  Volume:  Normal  Mood:  Anxious  Affect:  Blunt  Thought Process:  Coherent  Orientation:  Full (Time, Place, and Person)  Thought Content:  Hallucinations: Auditory Visual  Suicidal Thoughts:  No  Homicidal Thoughts:  No  Memory:  Immediate;   Fair Recent;    Fair Remote;   Fair  Judgement:  Impaired  Insight:  Fair  Psychomotor Activity:  Decreased  Concentration:  Fair  Recall:  [employer] [place]  Language: Fair  Akathisia:  No  Handed:  Right  AIMS (if indicated):     Assets:  Housing Leisure Time Physical Health Resilience Social Support  ADL's:  Intact  Cognition: WNL  Sleep:      Medical Decision Making: Review of Psycho-Social Stressors (1), Review or order clinical lab tests (1) and Review of Medication Regimen & Side Effects (2)  Treatment Plan Summary: Daily contact with patient to assess and evaluate symptoms and progress in treatment, Medication management and Plan admit to inpatient psychiatric unit for stabilization  Plan:  Transfer to [facility] Disposition: Accepted at [facility].  He will be transferred as soon as transportation is available,.  Delfin Gant, PMHNP-BC 09/21/2014 1:11 PM Patient seen face-to-face for psychiatric evaluation, chart reviewed and case discussed with the physician extender and developed treatment plan. Reviewed the information documented and agree with the treatment plan. Corena Pilgrim, MD

## 2014-09-21 NOTE — ED Notes (Signed)
Pt denies SI, HI, and AVH.  He states he is still depressed because he thinks he "obsessing over what people think of me"  When asked what people, he says "You Tubers"  Pt. Sleeping most of the day .  Denies pain or discomfort.

## 2016-10-10 ENCOUNTER — Encounter (HOSPITAL_COMMUNITY): Payer: Self-pay | Admitting: *Deleted

## 2016-10-10 ENCOUNTER — Inpatient Hospital Stay (HOSPITAL_COMMUNITY): Payer: Medicaid Other

## 2016-10-10 ENCOUNTER — Inpatient Hospital Stay (HOSPITAL_COMMUNITY)
Admission: EM | Admit: 2016-10-10 | Discharge: 2016-10-16 | DRG: 637 | Disposition: A | Discharge status: Home / Self Care | Payer: Medicaid Other | Attending: Internal Medicine | Admitting: Internal Medicine

## 2016-10-10 ENCOUNTER — Emergency Department (HOSPITAL_COMMUNITY): Payer: Medicaid Other

## 2016-10-10 DIAGNOSIS — E1110 Type 2 diabetes mellitus with ketoacidosis without coma: Secondary | ICD-10-CM | POA: Diagnosis present

## 2016-10-10 DIAGNOSIS — E0810 Diabetes mellitus due to underlying condition with ketoacidosis without coma: Secondary | ICD-10-CM | POA: Diagnosis not present

## 2016-10-10 DIAGNOSIS — F840 Autistic disorder: Secondary | ICD-10-CM | POA: Diagnosis present

## 2016-10-10 DIAGNOSIS — K859 Acute pancreatitis without necrosis or infection, unspecified: Secondary | ICD-10-CM | POA: Diagnosis present

## 2016-10-10 DIAGNOSIS — Y92009 Unspecified place in unspecified non-institutional (private) residence as the place of occurrence of the external cause: Secondary | ICD-10-CM | POA: Diagnosis not present

## 2016-10-10 DIAGNOSIS — E860 Dehydration: Secondary | ICD-10-CM | POA: Diagnosis present

## 2016-10-10 DIAGNOSIS — G9340 Encephalopathy, unspecified: Secondary | ICD-10-CM

## 2016-10-10 DIAGNOSIS — E870 Hyperosmolality and hypernatremia: Secondary | ICD-10-CM | POA: Diagnosis not present

## 2016-10-10 DIAGNOSIS — Z79899 Other long term (current) drug therapy: Secondary | ICD-10-CM | POA: Diagnosis not present

## 2016-10-10 DIAGNOSIS — F411 Generalized anxiety disorder: Secondary | ICD-10-CM | POA: Diagnosis present

## 2016-10-10 DIAGNOSIS — E876 Hypokalemia: Secondary | ICD-10-CM | POA: Diagnosis not present

## 2016-10-10 DIAGNOSIS — K760 Fatty (change of) liver, not elsewhere classified: Secondary | ICD-10-CM | POA: Diagnosis present

## 2016-10-10 DIAGNOSIS — N179 Acute kidney failure, unspecified: Secondary | ICD-10-CM | POA: Diagnosis present

## 2016-10-10 DIAGNOSIS — F259 Schizoaffective disorder, unspecified: Secondary | ICD-10-CM | POA: Diagnosis present

## 2016-10-10 DIAGNOSIS — I10 Essential (primary) hypertension: Secondary | ICD-10-CM | POA: Diagnosis present

## 2016-10-10 DIAGNOSIS — Z6841 Body Mass Index (BMI) 40.0 and over, adult: Secondary | ICD-10-CM

## 2016-10-10 DIAGNOSIS — E875 Hyperkalemia: Secondary | ICD-10-CM | POA: Diagnosis present

## 2016-10-10 DIAGNOSIS — G92 Toxic encephalopathy: Secondary | ICD-10-CM | POA: Diagnosis present

## 2016-10-10 DIAGNOSIS — E1310 Other specified diabetes mellitus with ketoacidosis without coma: Secondary | ICD-10-CM | POA: Diagnosis not present

## 2016-10-10 DIAGNOSIS — Z794 Long term (current) use of insulin: Secondary | ICD-10-CM

## 2016-10-10 DIAGNOSIS — E0910 Drug or chemical induced diabetes mellitus with ketoacidosis without coma: Secondary | ICD-10-CM | POA: Diagnosis not present

## 2016-10-10 DIAGNOSIS — J9690 Respiratory failure, unspecified, unspecified whether with hypoxia or hypercapnia: Secondary | ICD-10-CM | POA: Diagnosis present

## 2016-10-10 DIAGNOSIS — E871 Hypo-osmolality and hyponatremia: Secondary | ICD-10-CM | POA: Diagnosis present

## 2016-10-10 DIAGNOSIS — K853 Drug induced acute pancreatitis without necrosis or infection: Secondary | ICD-10-CM | POA: Diagnosis not present

## 2016-10-10 DIAGNOSIS — R748 Abnormal levels of other serum enzymes: Secondary | ICD-10-CM

## 2016-10-10 LAB — CBC WITH DIFFERENTIAL/PLATELET
BASOS ABS: 0 10*3/uL (ref 0.0–0.1)
BASOS PCT: 0 %
Basophils Absolute: 0 10*3/uL (ref 0.0–0.1)
Basophils Relative: 0 %
EOS ABS: 0 10*3/uL (ref 0.0–0.7)
EOS PCT: 0 %
EOS PCT: 0 %
Eosinophils Absolute: 0 10*3/uL (ref 0.0–0.7)
HCT: 44.4 % (ref 39.0–52.0)
Hemoglobin: 14.8 g/dL (ref 13.0–17.0)
LYMPHS ABS: 1.4 10*3/uL (ref 0.7–4.0)
LYMPHS PCT: 12 %
Lymphocytes Relative: 21 %
Lymphs Abs: 2.5 10*3/uL (ref 0.7–4.0)
MCH: 28.5 pg (ref 26.0–34.0)
MCHC: 33.3 g/dL (ref 30.0–36.0)
MCV: 85.4 fL (ref 78.0–100.0)
Monocytes Absolute: 0.3 10*3/uL (ref 0.1–1.0)
Monocytes Absolute: 0.7 10*3/uL (ref 0.1–1.0)
Monocytes Relative: 3 %
Monocytes Relative: 5 %
NEUTROS PCT: 85 %
Neutro Abs: 9.6 10*3/uL — ABNORMAL HIGH (ref 1.7–7.7)
Neutro Abs: 9 10*3/uL — ABNORMAL HIGH (ref 1.7–7.7)
Neutrophils Relative %: 74 %
PLATELETS: 264 10*3/uL (ref 150–400)
RBC: 5.2 MIL/uL (ref 4.22–5.81)
RDW: 13.9 % (ref 11.5–15.5)
WBC: 12.1 10*3/uL — AB (ref 4.0–10.5)

## 2016-10-10 LAB — POCT I-STAT, CHEM 8
BUN: 45 mg/dL — AB (ref 6–20)
CALCIUM ION: 1.52 mmol/L — AB (ref 1.15–1.40)
CHLORIDE: 126 mmol/L — AB (ref 101–111)
Creatinine, Ser: 1.7 mg/dL — ABNORMAL HIGH (ref 0.61–1.24)
HCT: 40 % (ref 39.0–52.0)
Hemoglobin: 13.6 g/dL (ref 13.0–17.0)
POTASSIUM: 3.5 mmol/L (ref 3.5–5.1)
Sodium: 154 mmol/L — ABNORMAL HIGH (ref 135–145)
TCO2: 10 mmol/L (ref 0–100)

## 2016-10-10 LAB — BASIC METABOLIC PANEL
ANION GAP: 21 — AB (ref 5–15)
ANION GAP: 16 — AB (ref 5–15)
ANION GAP: 17 — AB (ref 5–15)
Anion gap: 17 — ABNORMAL HIGH (ref 5–15)
Anion gap: 15 (ref 5–15)
BUN: 50 mg/dL — ABNORMAL HIGH (ref 6–20)
BUN: 49 mg/dL — ABNORMAL HIGH (ref 6–20)
BUN: 47 mg/dL — AB (ref 6–20)
BUN: 44 mg/dL — AB (ref 6–20)
BUN: 43 mg/dL — AB (ref 6–20)
CALCIUM: 10.3 mg/dL (ref 8.9–10.3)
CHLORIDE: 123 mmol/L — AB (ref 101–111)
CHLORIDE: 122 mmol/L — AB (ref 101–111)
CO2: 10 mmol/L — AB (ref 22–32)
CO2: 14 mmol/L — ABNORMAL LOW (ref 22–32)
CO2: 13 mmol/L — ABNORMAL LOW (ref 22–32)
CO2: 13 mmol/L — ABNORMAL LOW (ref 22–32)
CO2: 14 mmol/L — ABNORMAL LOW (ref 22–32)
CREATININE: 2.39 mg/dL — AB (ref 0.61–1.24)
Calcium: 10.2 mg/dL (ref 8.9–10.3)
Calcium: 10.1 mg/dL (ref 8.9–10.3)
Calcium: 10 mg/dL (ref 8.9–10.3)
Calcium: 10 mg/dL (ref 8.9–10.3)
Chloride: 121 mmol/L — ABNORMAL HIGH (ref 101–111)
Chloride: 125 mmol/L — ABNORMAL HIGH (ref 101–111)
Chloride: 125 mmol/L — ABNORMAL HIGH (ref 101–111)
Creatinine, Ser: 2.65 mg/dL — ABNORMAL HIGH (ref 0.61–1.24)
Creatinine, Ser: 2.47 mg/dL — ABNORMAL HIGH (ref 0.61–1.24)
Creatinine, Ser: 2.42 mg/dL — ABNORMAL HIGH (ref 0.61–1.24)
Creatinine, Ser: 2.46 mg/dL — ABNORMAL HIGH (ref 0.61–1.24)
GFR calc Af Amer: 35 mL/min — ABNORMAL LOW (ref >60)
GFR calc non Af Amer: 33 mL/min — ABNORMAL LOW (ref >60)
GFR, EST AFRICAN AMERICAN: 38 mL/min — AB (ref >60)
GFR, EST AFRICAN AMERICAN: 39 mL/min — AB (ref >60)
GFR, EST AFRICAN AMERICAN: 39 mL/min — AB (ref >60)
GFR, EST AFRICAN AMERICAN: 40 mL/min — AB (ref >60)
GFR, EST NON AFRICAN AMERICAN: 30 mL/min — AB (ref >60)
GFR, EST NON AFRICAN AMERICAN: 33 mL/min — AB (ref >60)
GFR, EST NON AFRICAN AMERICAN: 34 mL/min — AB (ref >60)
GFR, EST NON AFRICAN AMERICAN: 34 mL/min — AB (ref >60)
GLUCOSE: 820 mg/dL — AB (ref 65–99)
Glucose, Bld: 596 mg/dL (ref 65–99)
Glucose, Bld: 602 mg/dL (ref 65–99)
Glucose, Bld: 626 mg/dL (ref 65–99)
Glucose, Bld: 600 mg/dL (ref 65–99)
POTASSIUM: 3.5 mmol/L (ref 3.5–5.1)
POTASSIUM: 4.3 mmol/L (ref 3.5–5.1)
POTASSIUM: 4.7 mmol/L (ref 3.5–5.1)
POTASSIUM: 4 mmol/L (ref 3.5–5.1)
POTASSIUM: 3.8 mmol/L (ref 3.5–5.1)
SODIUM: 153 mmol/L — AB (ref 135–145)
SODIUM: 152 mmol/L — AB (ref 135–145)
SODIUM: 154 mmol/L — AB (ref 135–145)
Sodium: 152 mmol/L — ABNORMAL HIGH (ref 135–145)
Sodium: 155 mmol/L — ABNORMAL HIGH (ref 135–145)

## 2016-10-10 LAB — COMPREHENSIVE METABOLIC PANEL
ALT: 25 U/L (ref 17–63)
AST: 15 U/L (ref 15–41)
Albumin: 4.2 g/dL (ref 3.5–5.0)
Alkaline Phosphatase: 212 U/L — ABNORMAL HIGH (ref 38–126)
BUN: 62 mg/dL — ABNORMAL HIGH (ref 6–20)
CREATININE: 3.25 mg/dL — AB (ref 0.61–1.24)
Calcium: 10.5 mg/dL — ABNORMAL HIGH (ref 8.9–10.3)
Chloride: 95 mmol/L — ABNORMAL LOW (ref 101–111)
GFR calc Af Amer: 28 mL/min — ABNORMAL LOW (ref >60)
GFR calc non Af Amer: 24 mL/min — ABNORMAL LOW (ref >60)
Glucose, Bld: 1651 mg/dL (ref 65–99)
POTASSIUM: 6.5 mmol/L — AB (ref 3.5–5.1)
Sodium: 129 mmol/L — ABNORMAL LOW (ref 135–145)
Total Bilirubin: 1.8 mg/dL — ABNORMAL HIGH (ref 0.3–1.2)
Total Protein: 7.5 g/dL (ref 6.5–8.1)

## 2016-10-10 LAB — CBC
HEMATOCRIT: 52.8 % — AB (ref 39.0–52.0)
Hemoglobin: 15.1 g/dL (ref 13.0–17.0)
MCH: 28.5 pg (ref 26.0–34.0)
MCHC: 28.6 g/dL — AB (ref 30.0–36.0)
MCV: 99.6 fL (ref 78.0–100.0)
Platelets: 278 10*3/uL (ref 150–400)
RBC: 5.3 MIL/uL (ref 4.22–5.81)
RDW: 14.6 % (ref 11.5–15.5)
WBC: 11.2 10*3/uL — ABNORMAL HIGH (ref 4.0–10.5)

## 2016-10-10 LAB — I-STAT ARTERIAL BLOOD GAS, ED
Acid-base deficit: 24 mmol/L — ABNORMAL HIGH (ref 0.0–2.0)
Acid-base deficit: 24 mmol/L — ABNORMAL HIGH (ref 0.0–2.0)
BICARBONATE: 4.6 mmol/L — AB (ref 20.0–28.0)
Bicarbonate: 4.1 mmol/L — ABNORMAL LOW (ref 20.0–28.0)
O2 SAT: 96 %
O2 Saturation: 79 %
PCO2 ART: 14.5 mmHg — AB (ref 32.0–48.0)
PH ART: 7.059 — AB (ref 7.350–7.450)
Patient temperature: 98.6
Patient temperature: 98.6
TCO2: <5 mmol/L (ref 0–100)
TCO2: 5 mmol/L (ref 0–100)
pCO2 arterial: 17 mmHg — CL (ref 32.0–48.0)
pH, Arterial: 7.04 — CL (ref 7.350–7.450)
pO2, Arterial: 60 mmHg — ABNORMAL LOW (ref 83.0–108.0)
pO2, Arterial: 115 mmHg — ABNORMAL HIGH (ref 83.0–108.0)

## 2016-10-10 LAB — RAPID URINE DRUG SCREEN, HOSP PERFORMED
AMPHETAMINES: NOT DETECTED
BARBITURATES: NOT DETECTED
BENZODIAZEPINES: NOT DETECTED
COCAINE: NOT DETECTED
Opiates: NOT DETECTED
TETRAHYDROCANNABINOL: NOT DETECTED

## 2016-10-10 LAB — POCT I-STAT 3, ART BLOOD GAS (G3+)
ACID-BASE DEFICIT: 14 mmol/L — AB (ref 0.0–2.0)
BICARBONATE: 11.4 mmol/L — AB (ref 20.0–28.0)
O2 Saturation: 98 %
PH ART: 7.274 — AB (ref 7.350–7.450)
TCO2: 12 mmol/L (ref 0–100)
pCO2 arterial: 24.5 mmHg — ABNORMAL LOW (ref 32.0–48.0)
pO2, Arterial: 109 mmHg — ABNORMAL HIGH (ref 83.0–108.0)

## 2016-10-10 LAB — I-STAT CG4 LACTIC ACID, ED
Lactic Acid, Venous: 2.17 mmol/L (ref 0.5–1.9)
Lactic Acid, Venous: 0.93 mmol/L (ref 0.5–1.9)

## 2016-10-10 LAB — URINALYSIS, ROUTINE W REFLEX MICROSCOPIC
Bilirubin Urine: NEGATIVE
Glucose, UA: >=500 mg/dL — AB
Ketones, ur: 20 mg/dL — AB
Leukocytes, UA: NEGATIVE
Nitrite: NEGATIVE
PROTEIN: 30 mg/dL — AB
SPECIFIC GRAVITY, URINE: 1.026 (ref 1.005–1.030)
pH: 5 (ref 5.0–8.0)

## 2016-10-10 LAB — GLUCOSE, CAPILLARY
GLUCOSE-CAPILLARY: 500 mg/dL — AB (ref 65–99)
GLUCOSE-CAPILLARY: 533 mg/dL — AB (ref 65–99)
Glucose-Capillary: 531 mg/dL (ref 65–99)

## 2016-10-10 LAB — STREP PNEUMONIAE URINARY ANTIGEN: STREP PNEUMO URINARY ANTIGEN: NEGATIVE

## 2016-10-10 LAB — MAGNESIUM: Magnesium: 3.5 mg/dL — ABNORMAL HIGH (ref 1.7–2.4)

## 2016-10-10 LAB — MRSA PCR SCREENING: MRSA by PCR: NEGATIVE

## 2016-10-10 LAB — PROCALCITONIN: Procalcitonin: 0.24 ng/mL

## 2016-10-10 LAB — I-STAT TROPONIN, ED: Troponin i, poc: 0.02 ng/mL (ref 0.00–0.08)

## 2016-10-10 LAB — BETA-HYDROXYBUTYRIC ACID

## 2016-10-10 LAB — CBG MONITORING, ED
Glucose-Capillary: >600 mg/dL (ref 65–99)
Glucose-Capillary: >600 mg/dL (ref 65–99)
Glucose-Capillary: >600 mg/dL (ref 65–99)

## 2016-10-10 LAB — PHOSPHORUS: PHOSPHORUS: 1.4 mg/dL — AB (ref 2.5–4.6)

## 2016-10-10 LAB — ETHANOL

## 2016-10-10 LAB — LIPASE, BLOOD: Lipase: 767 U/L — ABNORMAL HIGH (ref 11–51)

## 2016-10-10 LAB — CK: Total CK: 455 U/L — ABNORMAL HIGH (ref 49–397)

## 2016-10-10 MED ORDER — SODIUM CHLORIDE 0.9 % IV BOLUS (SEPSIS)
1000.0000 mL | Freq: Once | INTRAVENOUS | Status: AC
  Administered 2016-10-10: 1000 mL via INTRAVENOUS

## 2016-10-10 MED ORDER — INSULIN ASPART 100 UNIT/ML ~~LOC~~ SOLN
10.0000 [IU] | Freq: Once | SUBCUTANEOUS | Status: AC
  Administered 2016-10-10: 10 [IU] via INTRAVENOUS
  Filled 2016-10-10: qty 1

## 2016-10-10 MED ORDER — FAMOTIDINE IN NACL 20-0.9 MG/50ML-% IV SOLN
20.0000 mg | INTRAVENOUS | Status: DC
Start: 2016-10-10 — End: 2016-10-13
  Administered 2016-10-10 – 2016-10-12 (×3): 20 mg via INTRAVENOUS
  Filled 2016-10-10 (×5): qty 50

## 2016-10-10 MED ORDER — VANCOMYCIN HCL IN DEXTROSE 1-5 GM/200ML-% IV SOLN: 1000.0000 mg | Freq: Once | INTRAVENOUS | Status: DC

## 2016-10-10 MED ORDER — SODIUM CHLORIDE 0.9 % IV SOLN: INTRAVENOUS | Status: DC

## 2016-10-10 MED ORDER — SODIUM CHLORIDE 0.9 % IV SOLN: INTRAVENOUS | Status: DC | PRN

## 2016-10-10 MED ORDER — PIPERACILLIN-TAZOBACTAM 3.375 G IVPB 30 MIN
3.3750 g | Freq: Once | INTRAVENOUS | Status: AC
  Administered 2016-10-10: 3.375 g via INTRAVENOUS
  Filled 2016-10-10: qty 50

## 2016-10-10 MED ORDER — VANCOMYCIN HCL 10 G IV SOLR
2000.0000 mg | INTRAVENOUS | Status: AC
  Administered 2016-10-10: 2000 mg via INTRAVENOUS
  Filled 2016-10-10: qty 2000

## 2016-10-10 MED ORDER — SODIUM CHLORIDE 0.9 % IV BOLUS (SEPSIS)
1000.0000 mL | Freq: Once | INTRAVENOUS | Status: AC
  Administered 2016-10-10 (×2): 1000 mL via INTRAVENOUS

## 2016-10-10 MED ORDER — PIPERACILLIN-TAZOBACTAM 3.375 G IVPB
3.3750 g | Freq: Three times a day (TID) | INTRAVENOUS | Status: DC
  Administered 2016-10-10 – 2016-10-11 (×3): 3.375 g via INTRAVENOUS
  Filled 2016-10-10 (×5): qty 50

## 2016-10-10 MED ORDER — SODIUM CHLORIDE 0.9 % IV SOLN
INTRAVENOUS | Status: DC
  Administered 2016-10-10: 5.4 [IU]/h via INTRAVENOUS
  Administered 2016-10-10: 11.3 [IU]/h via INTRAVENOUS
  Administered 2016-10-10: 16.4 [IU]/h via INTRAVENOUS
  Administered 2016-10-10: 21.6 [IU]/h via INTRAVENOUS
  Administered 2016-10-10: 10.8 [IU]/h via INTRAVENOUS
  Administered 2016-10-11: 6 [IU]/h via INTRAVENOUS
  Administered 2016-10-11: 16.1 [IU]/h via INTRAVENOUS
  Filled 2016-10-10 (×4): qty 1

## 2016-10-10 MED ORDER — STERILE WATER FOR INJECTION IV SOLN
INTRAVENOUS | Status: DC
  Administered 2016-10-10: 13:00:00 via INTRAVENOUS
  Filled 2016-10-10 (×3): qty 850

## 2016-10-10 MED ORDER — INSULIN REGULAR BOLUS VIA INFUSION
0.0000 [IU] | Freq: Three times a day (TID) | INTRAVENOUS | Status: DC
  Filled 2016-10-10: qty 10

## 2016-10-10 MED ORDER — SODIUM CHLORIDE 0.9 % IV BOLUS (SEPSIS): 1000.0000 mL | Freq: Once | INTRAVENOUS | Status: DC

## 2016-10-10 MED ORDER — HEPARIN SODIUM (PORCINE) 5000 UNIT/ML IJ SOLN
5000.0000 [IU] | Freq: Three times a day (TID) | INTRAMUSCULAR | Status: DC
  Administered 2016-10-10 – 2016-10-12 (×6): 5000 [IU] via SUBCUTANEOUS
  Filled 2016-10-10 (×7): qty 1

## 2016-10-10 MED ORDER — DEXTROSE-NACL 5-0.45 % IV SOLN
INTRAVENOUS | Status: DC
  Administered 2016-10-10 – 2016-10-11 (×2): via INTRAVENOUS

## 2016-10-10 MED ORDER — RISPERIDONE 2 MG PO TABS
2.0000 mg | ORAL_TABLET | Freq: Every day | ORAL | Status: DC
  Administered 2016-10-10 – 2016-10-12 (×3): 2 mg via ORAL
  Filled 2016-10-10 (×3): qty 1

## 2016-10-10 MED ORDER — INSULIN ASPART 100 UNIT/ML IV SOLN: 10.0000 [IU] | Freq: Once | INTRAVENOUS | Status: DC

## 2016-10-10 MED ORDER — CHLORHEXIDINE GLUCONATE 0.12 % MT SOLN
15.0000 mL | Freq: Two times a day (BID) | OROMUCOSAL | Status: DC
  Administered 2016-10-10 – 2016-10-12 (×4): 15 mL via OROMUCOSAL
  Filled 2016-10-10: qty 15

## 2016-10-10 MED ORDER — VANCOMYCIN HCL 10 G IV SOLR
1500.0000 mg | INTRAVENOUS | Status: DC
  Filled 2016-10-10: qty 1500

## 2016-10-10 MED ORDER — DEXTROSE-NACL 5-0.45 % IV SOLN
INTRAVENOUS | Status: DC
  Administered 2016-10-10: 10:00:00 via INTRAVENOUS

## 2016-10-10 MED ORDER — DEXTROSE 50 % IV SOLN
25.0000 mL | INTRAVENOUS | Status: DC | PRN
  Administered 2016-10-10: 25 mL via INTRAVENOUS
  Filled 2016-10-10: qty 50

## 2016-10-10 MED ORDER — ORAL CARE MOUTH RINSE
15.0000 mL | Freq: Two times a day (BID) | OROMUCOSAL | Status: DC
  Administered 2016-10-11 (×2): 15 mL via OROMUCOSAL

## 2016-10-10 NOTE — Progress Notes (Addendum)
CRITICAL VALUE ALERT  Critical Value: glucose 820  Date & Time Notied:  10/10/16 1540  Provider Notified: dr. Arsenio Loadersommer   Orders received to leave insulin drip at 11.3, stat Bmet.

## 2016-10-10 NOTE — Progress Notes (Signed)
eLink Physician-Brief Progress Note Patient Name: Eliezer ChampagneDevon Zell DOB: 06/12/1985 MRN: [id number]   Date of Service  10/10/2016  HPI/Events of Note  Blood glucose = 596 after holding insulin IV infusion for 1 hour for blood glucose = 533.   eICU Interventions  Will hold insulin for another hour and restart IV infusion at 3 units/hour.      Intervention Category Major Interventions: Hyperglycemia - active titration of insulin therapy  Sommer,Steven Eugene 10/10/2016, 6:00 PM

## 2016-10-10 NOTE — Procedures (Signed)
Arterial Catheter Insertion Procedure Note Jacob ChampagneDevon Price [id number] 03/25/1986  Procedure: Insertion of Arterial Catheter  Indications: Frequent blood sampling  Procedure Details Consent: Risks of procedure as well as the alternatives and risks of each were explained to the (patient/caregiver).  Consent for procedure obtained. Time Out: Verified patient identification, verified procedure, site/side was marked, verified correct patient position, special equipment/implants available, medications/allergies/relevent history reviewed, required imaging and test results available.  Performed  Maximum sterile technique was used including antiseptics, cap, gloves, gown, hand hygiene, mask and sheet. Skin prep: Chlorhexidine; local anesthetic administered 20 gauge catheter was inserted into right radial artery using the Seldinger technique.  Evaluation Blood flow good; BP tracing good. Complications: No apparent complications.   Leafy HalfSnider, Toy Eisemann Dale 10/10/2016

## 2016-10-10 NOTE — Progress Notes (Signed)
eLink Physician-Brief Progress Note Patient Name: Jacob Price DOB: 03/09/1986 MRN: [id number]   Date of Service  10/10/2016  HPI/Events of Note  Blood glucose = 820. Currently on Insulin IV infusion and D5 0.45 NaCl.   eICU Interventions  Will order: 1. BMP STAT.     Intervention Category Major Interventions: Hyperglycemia - active titration of insulin therapy  Lenell AntuSommer,Sumie Remsen Eugene 10/10/2016, 3:53 PM

## 2016-10-10 NOTE — Progress Notes (Signed)
Pharmacy Antibiotic Note  Jacob ChampagneDevon Price is a 31 y.o. male admitted on 10/10/2016 with sepsis.  Pharmacy has been consulted for vancomycin and zosyn dosing.  Patient received vancomycin 2g and zosyn 3.375g IV once in the ED.  Plan: Vancomycin 1500mg  IV every 24 hours.  Goal trough 15-20 mcg/mL. Zosyn 3.375g IV q8h (4 hour infusion).  Monitor culture data, renal function and clinical course VT at SS prn  Height: 5\' 10"  (177.8 cm) IBW/kg (Calculated) : 73  Temp (24hrs), Avg:97.5 F (36.4 C), Min:97.5 F (36.4 C), Max:97.5 F (36.4 C)   Recent Labs Lab 10/10/16 0719 10/10/16 0739  WBC DUPL SEE [id number]  11.2*  --   CREATININE 3.25*  --   LATICACIDVEN  --  2.17*    CrCl cannot be calculated (Unknown ideal weight.).    No Known Allergies   Arlean Hoppingorey M. Newman PiesBall, PharmD, BCPS Clinical Pharmacist [phone] 10/10/2016 9:08 AM

## 2016-10-10 NOTE — Progress Notes (Signed)
Dr. Arsenio LoaderSommer notified of capillary blood glucose of 533. Orders received to turn off insulin drip for 1 hour, then restart at half the rate which will be 6Units/hr. Continue to draw BMETs q2hours.

## 2016-10-10 NOTE — ED Notes (Signed)
CBG >600; RN aware 

## 2016-10-10 NOTE — ED Provider Notes (Signed)
[facility]-EMERGENCY DEPT Provider Note   CSN: [id number] Arrival date & time: 10/10/16  2595  Level 5 caveat- due to altered mental status   History   Chief Complaint Chief Complaint  Patient presents with  . Altered Mental Status    HPI Jacob Price is a 31 y.o. male.  HPI  31 year old male with history of schizoaffective disorder and hypertension who presents today with his mother stating that his mental status is altered. She reports that he has not been well over the past week with decreased by mouth intake. He has been taking fluids. He did not complain of anything specific to her. She states that he has been having decreasing activity and by mouth intake and last night did not appear well but she does not note that he had fever, cough, vomiting, or diarrhea. She states that this morning when she was able to get into his apartment he did endorse that he had vomited one time. She states that this morning he was less responsive and she found him at 4:30 this morning. He was on the ground by the front door. He was transported here via EMS S in a cervical collar is in place. She does not report any recent changes in medications. She states they discuss take high blood pressure medication but has not taken it regularly. He goes to  [facility] for his healthcare  Past Medical History:  Diagnosis Date  . Hypertension     Patient Active Problem List   Diagnosis Date Noted  . Schizoaffective disorder (HCC) 09/20/2014  . Hallucinations 09/20/2014  . Generalized anxiety disorder 08/01/2014  . Autism spectrum disorder 07/29/2014    History reviewed. No pertinent surgical history.     Home Medications    Prior to Admission medications   Medication Sig Start Date End Date Taking? Authorizing Provider  busPIRone (BUSPAR) 7.5 MG tablet Take 1 tablet (7.5 mg total) by mouth 3 (three) times daily. 08/03/14   Adonis Brook, NP  sertraline (ZOLOFT) 50 MG tablet Take 1 tablet  (50 mg total) by mouth daily. 08/03/14   Adonis Brook, NP  traZODone (DESYREL) 50 MG tablet Take 1 tablet (50 mg total) by mouth at bedtime. 08/03/14   Adonis Brook, NP  triamterene-hydrochlorothiazide (MAXZIDE-25) 37.5-25 MG per tablet Take 1 tablet by mouth daily. 08/03/14   Adonis Brook, NP    Family History No family history on file.  Social History Social History  Substance Use Topics  . Smoking status: Never Smoker  . Smokeless tobacco: Never Used  . Alcohol use No     Allergies   Patient has no known allergies.   Review of Systems Review of Systems  Unable to perform ROS: Mental status change     Physical Exam Updated Vital Signs BP 98/60 (BP Location: Right Arm)   Pulse (!) 101   Temp 97.5 F (36.4 C) (Oral)   Resp (!) 28   Ht 1.778 m (5\' 10" )   SpO2 100%   Physical Exam  Constitutional: He appears well-developed and well-nourished.  HENT:  Head: Normocephalic and atraumatic.  Right Ear: External ear normal.  Left Ear: External ear normal.  Mucous membranes are dry  Eyes: Conjunctivae are normal.  Patient appears to move his eyes but this is difficult to assess as he rolls his eyes up in his head with and open his eyes and does not voluntarily open his eyes or track for me  Neck:  Cervical collar in place  Cardiovascular:  Normal rate.   Pulmonary/Chest: Effort normal.  Abdominal: Soft. Bowel sounds are normal.  Musculoskeletal: Normal range of motion. He exhibits no edema.  Neurological:  Patient with eyes closed give some verbal response with sternal rub  Skin:  No rashes noted and skin is cool to touch  Nursing note and vitals reviewed.    ED Treatments / Results  Labs (all labs ordered are listed, but only abnormal results are displayed) Labs Reviewed  CBC - Abnormal; Notable for the following:       Result Value   WBC 11.2 (*)    HCT 52.8 (*)    MCHC 28.6 (*)    All other components within normal limits  URINALYSIS, ROUTINE W REFLEX  MICROSCOPIC - Abnormal; Notable for the following:    Glucose, UA >=500 (*)    Hgb urine dipstick MODERATE (*)    Ketones, ur 20 (*)    Protein, ur 30 (*)    Bacteria, UA RARE (*)    Squamous Epithelial / LPF 0-5 (*)    All other components within normal limits  CULTURE, BLOOD (ROUTINE X 2)  CULTURE, BLOOD (ROUTINE X 2)  COMPREHENSIVE METABOLIC PANEL  RAPID URINE DRUG SCREEN, HOSP PERFORMED  CBC WITH DIFFERENTIAL/PLATELET  LIPASE, BLOOD  ETHANOL  CBG MONITORING, ED  I-STAT CG4 LACTIC ACID, ED  I-STAT TROPOININ, ED  I-STAT ARTERIAL BLOOD GAS, ED    EKG  EKG Interpretation None       Radiology Ct Head Wo Contrast  Result Date: 10/10/2016 CLINICAL DATA:  Fatigue and decreased appetite since last night. Altered level of consciousness. Patient found down today. EXAM: CT HEAD WITHOUT CONTRAST CT CERVICAL SPINE WITHOUT CONTRAST TECHNIQUE: Multidetector CT imaging of the head and cervical spine was performed following the standard protocol without intravenous contrast. Multiplanar CT image reconstructions of the cervical spine were also generated. COMPARISON:  None. FINDINGS: CT HEAD FINDINGS Brain: Appears normal without hemorrhage, infarct, mass lesion, mass effect, midline shift or abnormal extra-axial fluid collection. No hydrocephalus or pneumocephalus. Vascular: Negative. Skull: Intact. Sinuses/Orbits: Negative. Other: None. CT CERVICAL SPINE FINDINGS Alignment: Normal. Skull base and vertebrae: No acute fracture. No primary bone lesion or focal pathologic process. Soft tissues and spinal canal: No prevertebral fluid or swelling. No visible canal hematoma. Disc levels: Intervertebral disc space height is maintained. Mild endplate spurring C3-7 noted. Upper chest: Lung apices clear. Other: None. IMPRESSION: Negative head and cervical spine CT scans. Electronically Signed   By: Drusilla Kanner M.D.   On: 10/10/2016 10:59   Ct Cervical Spine Wo Contrast  Result Date:  10/10/2016 CLINICAL DATA:  Fatigue and decreased appetite since last night. Altered level of consciousness. Patient found down today. EXAM: CT HEAD WITHOUT CONTRAST CT CERVICAL SPINE WITHOUT CONTRAST TECHNIQUE: Multidetector CT imaging of the head and cervical spine was performed following the standard protocol without intravenous contrast. Multiplanar CT image reconstructions of the cervical spine were also generated. COMPARISON:  None. FINDINGS: CT HEAD FINDINGS Brain: Appears normal without hemorrhage, infarct, mass lesion, mass effect, midline shift or abnormal extra-axial fluid collection. No hydrocephalus or pneumocephalus. Vascular: Negative. Skull: Intact. Sinuses/Orbits: Negative. Other: None. CT CERVICAL SPINE FINDINGS Alignment: Normal. Skull base and vertebrae: No acute fracture. No primary bone lesion or focal pathologic process. Soft tissues and spinal canal: No prevertebral fluid or swelling. No visible canal hematoma. Disc levels: Intervertebral disc space height is maintained. Mild endplate spurring S2-8 noted. Upper chest: Lung apices clear. Other: None. IMPRESSION: Negative head and cervical spine  CT scans. Electronically Signed   By: Drusilla Kannerhomas  Dalessio M.D.   On: 10/10/2016 10:59   Dg Chest Port 1 View  Result Date: 10/10/2016 CLINICAL DATA:  Fatigue and decreased appetite, found lying on the floor. EXAM: PORTABLE CHEST 1 VIEW COMPARISON:  None. FINDINGS: Trachea is midline. Heart size within normal limits. Lungs are low in volume. No airspace consolidation or pleural fluid. IMPRESSION: Low lung volumes.  No acute findings. Electronically Signed   By: Leanna BattlesMelinda  Blietz M.D.   On: 10/10/2016 07:53    Procedures Procedures (including critical care time)  Medications Ordered in ED Medications  sodium chloride 0.9 % bolus 1,000 mL (not administered)    And  sodium chloride 0.9 % bolus 1,000 mL (not administered)    And  sodium chloride 0.9 % bolus 1,000 mL (not administered)    And   sodium chloride 0.9 % bolus 1,000 mL (not administered)     Initial Impression / Assessment and Plan / ED Course  I have reviewed the triage vital signs and the nursing notes.  Pertinent labs & imaging results that were available during my care of the patient were reviewed by me and considered in my medical decision making (see chart for details).    Patient with altered mental status here. PH 7.05 with glucose 1600. Head CT with out any acute intracranial abnormality seen. Patient's workup here consistent with new onset DKA. Initially patient started as code sepsis. He is fluid resuscitated here and given antibiotics. No definitive source of infection found. Initial lactic acid 2 and blood pressure has rebounded with IV recess fluid resuscitation. Patient is becoming more alert. Repeat ABG is pending. Patient received IV insulin for hyperkalemia and DKA. Repeat P med is currently pending. 1- altered mental status- likely secondary to dka 2- dka- new onset- ? Initiated by infection, vs pancreatitis vs other 3- hyperkalemia- treated with insulin push, hydration- will recheck 4-aki/renal failure- may be secondary to volume depletion 5- elevated ck- likely due to mild rhabdo 6- pancreatitis- lipase elevated Discussed with Dr. Jamison NeighborNestor for critical care and will be in to evaluate CRITICAL CARE Performed by: Ada Woodbury S Total critical care time: 45 minutes Critical care time was exclusive of separately billable procedures and treating other patients. Critical care was necessary to treat or prevent imminent or life-threatening deterioration. Critical care was time spent personally by me on the following activities: development of treatment plan with patient and/or surrogate as well as nursing, discussions with consultants, evaluation of patient's response to treatment, examination of patient, obtaining history from patient or surrogate, ordering and performing treatments and interventions,  ordering and review of laboratory studies, ordering and review of radiographic studies, pulse oximetry and re-evaluation of patient's condition.  Final Clinical Impressions(s) / ED Diagnoses   Final diagnoses:  Respiratory failure (HCC)  Elevated lipase  DKA  New Prescriptions New Prescriptions   No medications on file     Margarita Grizzleay, Jaydis Duchene, MD 10/13/16 2141

## 2016-10-10 NOTE — Progress Notes (Signed)
Inpatient Diabetes Program Recommendations  AACE/ADA: New Consensus Statement on Inpatient Glycemic Control (2015)  Target Ranges:  Prepandial:   less than 140 mg/dL      Peak postprandial:   less than 180 mg/dL (1-2 hours)      Critically ill patients:  140 - 180 mg/dL   Lab Results  Component Value Date   GLUCAP >600 (HH) 10/10/2016    Review of Glycemic Control  Received page from ICU RN regarding pt being on IV insulin. RN stated MD wanted pt on GlucoStabilizer with Q2H BMETs (no hourly CBG checks) and did not want blood sugar to go < 1000 mg/dL until 7/84/69626/16/2018. Explained that we could not change hourly CBG checks to Q2H. GlucoStabilizer set up to drop blood sugars 75-100 mg/dL per hour. Would need to d/c IV insulin orders and DKA Phase 1 orders and call MD every 2 hours for how much insulin needed per hour. When blood sugars reach < 600 mg/dL, can restart DKA order set with hourly CBG checks.  Discussed above with RN. Attempted to reach NP with no success. RN placed call to MD on call overnight to make aware of needing an order every 2 hours for insulin.  Will follow.  Thank you. Ailene Ardshonda Azani Brogdon, RD, LDN, CDE Inpatient Diabetes Coordinator [phone]

## 2016-10-10 NOTE — ED Notes (Signed)
D5 .45 NS wasn't started scan in error

## 2016-10-10 NOTE — ED Notes (Signed)
Patient presents to ed via [facility]  Decreased loc . Mother states she was with patient last pm said he didn't feel well and  Was c/o fatigue and decrease appetite. Mother states she left around midnight and returned to check on him this am, states she called him before arriving and he wouldn't answer the phone, when she got to his house of which he lives alone, she couldn't get him to the door, went back to her car and returned to the house a second time the front door was cracked and she was able to get in. Patient was lying on the floor with altered loc, last really normal according to mother was Sunday. Mother states he has recently been seen at [facility] and gets "shots" every 2 weeks. Patient will response to voice.

## 2016-10-10 NOTE — Progress Notes (Signed)
CRITICAL VALUE ALERT  Critical Value: glucose 596  Date & Time Notied:  10/10/16 at 1750  Provider Notified: Dr. Arsenio LoaderSommer  Orders Received/Actions taken: Hold insulin drip for another hour then restart at 3units/hr.

## 2016-10-10 NOTE — Progress Notes (Signed)
CRITICAL VALUE ALERT  Critical Value:  626 glucose  Date & Time Notied: 10/10/2016 2130  Provider Notified: Deterding  Orders Received/Actions taken: Restart glucostabilizer and follow DKA protocol.

## 2016-10-10 NOTE — H&P (Signed)
[facility] / CRITICAL CARE MEDICINE   Name: Jacob Price MRN: [id number] DOB: [birth year]    ADMISSION DATE:  10/10/2016 CONSULTATION DATE:  10/10/2016  REFERRING MD:  Pattricia Boss, MD  CHIEF COMPLAINT: Found down at home, AMS/DKA  HISTORY OF PRESENT ILLNESS:   31 year old male with PMH significant for untreated HTN and schizophrenia/? Autism. He is followed by [facility], where he receives IM injections of Respidol Consta once every 2 weeks.. Pt. Presents to the ED 10/10/16 after having been found down by mother with AMS. He does live alone.Per mother  Pt. Has not felt well x 6-7 days, with poor appetite and fatigue.Per his mother he was last in his normal state Sunday 6/10.The evening of 6/14 mother states he was feeling poorly, with no appetite. She states he has lost weight recently.She  had brought food ( Sticky buns and other high sugar snacks) to encourage patient to eat. She left patient's apartment about midnight.She woke up at 4 am,tried to call him but he did not answer. She drove to his apartment and he would not answer the door. She walked back to her car where she stated she heard a noise, went back and the door was cracked,When she opened the door he was on the ground with AMS. EMS was called and patient was transported to the [facility] ED where he was found to be in DKA with serum glucose of 1651.No previous history of DM, but family history in grandmother. Initial lactate was 2.17, pH of  7.059, CO2 of 14.5,PO2 of 60, Bicarb of 4.1. Potassium was 6.5, Na 129, Creatinine 3.25,Alk Phos: 212 , lipase 767, total Bili of 1.8. He was treated with 25 cc's of D50, and 10 units of IV insulin. Insulin gtt was started per the DKA protocol.He received a total of 3 L NS in the ED. Antibiotic therapy was initiated with Zosyn and Vanc, per pharmacy. CCM was asked to admit and manage care.  Of Note, Pt  has not worked in 2 years.  PAST MEDICAL HISTORY :  He  has a past medical history of  Hypertension.  PAST SURGICAL HISTORY: He  has no past surgical history on file.  No Known Allergies  No current facility-administered medications on file prior to encounter.    Current Outpatient Prescriptions on File Prior to Encounter  Medication Sig  . busPIRone (BUSPAR) 7.5 MG tablet Take 1 tablet (7.5 mg total) by mouth 3 (three) times daily.  . sertraline (ZOLOFT) 50 MG tablet Take 1 tablet (50 mg total) by mouth daily.  . traZODone (DESYREL) 50 MG tablet Take 1 tablet (50 mg total) by mouth at bedtime.  . triamterene-hydrochlorothiazide (MAXZIDE-25) 37.5-25 MG per tablet Take 1 tablet by mouth daily.    FAMILY HISTORY:  His has no family status information on file.    SOCIAL HISTORY: He  reports that he has never smoked. He has never used smokeless tobacco. He reports that he does not drink alcohol or use drugs.  REVIEW OF SYSTEMS:   Gen: Denies fever, chills, +weight change, +fatigue, night sweats HEENT: Denies blurred vision, double vision, hearing loss, tinnitus, sinus congestion, rhinorrhea, sore throat, neck stiffness, dysphagia PULM: Denies shortness of breath, cough, sputum production, hemoptysis, wheezing CV: Denies chest pain, edema, orthopnea, paroxysmal nocturnal dyspnea, palpitations GI: Denies abdominal pain, nausea, vomiting, diarrhea, hematochezia, melena, constipation, change in bowel habits, + poor appetite GU: Denies dysuria, hematuria, + polyuria, oliguria, urethral discharge Endocrine: Denies hot or cold intolerance,+ polyuria,  polyphagia or +appetite change Derm: Denies rash, dry skin, scaling or peeling skin change Heme: Denies easy bruising, bleeding, bleeding gums Neuro: Denies headache, numbness, weakness, slurred speech, loss of memory or consciousness  SUBJECTIVE:  Arousable male supine in bed with C collar intact, slow to answer questions. States he has not been feeling well.  VITAL SIGNS: BP 104/63   Pulse 97   Temp 97.5 F (36.4 C)  (Oral)   Resp (!) 22   Ht '5\' 10"'  (1.778 m)   SpO2 100%   HEMODYNAMICS:    VENTILATOR SETTINGS:    INTAKE / OUTPUT: No intake/output data recorded.  PHYSICAL EXAMINATION: General:  Arousable male, supine in bed, slow to respond( ? Baseline), NAD Neuro:  MAE x 4, A&O x 3, PERRLA, slow to respond but ? If baseline HEENT: C Collar in place, normocephalic, atraumatic, -JVD, no LAD, MM dry Cardiovascular:  Tachy per monitor, S1, S2, RRR, No RMG Lungs:  Clear throughout, respirations regular, fruity breath Abdomen:  Non-tender, non-distended, BS + Musculoskeletal:  No obvious deformities noted, Skin:  Warm and dry,intact, no lesions or rash noted., no mottling noted  LABS:  BMET  Recent Labs Lab 10/10/16 0719  NA 129*  K 6.5*  CL 95*  CO2 <7*  BUN 62*  CREATININE 3.25*  GLUCOSE 1,651*    Electrolytes  Recent Labs Lab 10/10/16 0719  CALCIUM 10.5*    CBC  Recent Labs Lab 10/10/16 0719  WBC DUPL SEE [id number]  11.2*  HGB DUPL SEE [id number]  15.1  HCT DUPL SEE [id number]  52.8*  PLT DUPL SEE [id number]  278    Coag's No results for input(s): APTT, INR in the last 168 hours.  Sepsis Markers  Recent Labs Lab 10/10/16 0739 10/10/16 1147  LATICACIDVEN 2.17* 0.93    ABG  Recent Labs Lab 10/10/16 0811 10/10/16 1139  PHART 7.059* 7.040*  PCO2ART 14.5* 17.0*  PO2ART 60.0* 115.0*    Liver Enzymes  Recent Labs Lab 10/10/16 0719  AST 15  ALT 25  ALKPHOS 212*  BILITOT 1.8*  ALBUMIN 4.2    Cardiac Enzymes No results for input(s): TROPONINI, PROBNP in the last 168 hours.  Glucose  Recent Labs Lab 10/10/16 0631 10/10/16 1015 10/10/16 1117  GLUCAP >600* >600* >600*    Imaging Ct Head Wo Contrast  Result Date: 10/10/2016 CLINICAL DATA:  Fatigue and decreased appetite since last night. Altered level of consciousness. Patient found down today. EXAM: CT HEAD WITHOUT CONTRAST CT CERVICAL SPINE WITHOUT CONTRAST TECHNIQUE: Multidetector CT imaging of  the head and cervical spine was performed following the standard protocol without intravenous contrast. Multiplanar CT image reconstructions of the cervical spine were also generated. COMPARISON:  None. FINDINGS: CT HEAD FINDINGS Brain: Appears normal without hemorrhage, infarct, mass lesion, mass effect, midline shift or abnormal extra-axial fluid collection. No hydrocephalus or pneumocephalus. Vascular: Negative. Skull: Intact. Sinuses/Orbits: Negative. Other: None. CT CERVICAL SPINE FINDINGS Alignment: Normal. Skull base and vertebrae: No acute fracture. No primary bone lesion or focal pathologic process. Soft tissues and spinal canal: No prevertebral fluid or swelling. No visible canal hematoma. Disc levels: Intervertebral disc space height is maintained. Mild endplate spurring A4-5 noted. Upper chest: Lung apices clear. Other: None. IMPRESSION: Negative head and cervical spine CT scans. Electronically Signed   By: Inge Rise M.D.   On: 10/10/2016 10:59   Ct Cervical Spine Wo Contrast  Result Date: 10/10/2016 CLINICAL DATA:  Fatigue and decreased appetite since last night. Altered level of consciousness. Patient  found down today. EXAM: CT HEAD WITHOUT CONTRAST CT CERVICAL SPINE WITHOUT CONTRAST TECHNIQUE: Multidetector CT imaging of the head and cervical spine was performed following the standard protocol without intravenous contrast. Multiplanar CT image reconstructions of the cervical spine were also generated. COMPARISON:  None. FINDINGS: CT HEAD FINDINGS Brain: Appears normal without hemorrhage, infarct, mass lesion, mass effect, midline shift or abnormal extra-axial fluid collection. No hydrocephalus or pneumocephalus. Vascular: Negative. Skull: Intact. Sinuses/Orbits: Negative. Other: None. CT CERVICAL SPINE FINDINGS Alignment: Normal. Skull base and vertebrae: No acute fracture. No primary bone lesion or focal pathologic process. Soft tissues and spinal canal: No prevertebral fluid or swelling.  No visible canal hematoma. Disc levels: Intervertebral disc space height is maintained. Mild endplate spurring M1-9 noted. Upper chest: Lung apices clear. Other: None. IMPRESSION: Negative head and cervical spine CT scans. Electronically Signed   By: Inge Rise M.D.   On: 10/10/2016 10:59   Dg Chest Port 1 View  Result Date: 10/10/2016 CLINICAL DATA:  Fatigue and decreased appetite, found lying on the floor. EXAM: PORTABLE CHEST 1 VIEW COMPARISON:  None. FINDINGS: Trachea is midline. Heart size within normal limits. Lungs are low in volume. No airspace consolidation or pleural fluid. IMPRESSION: Low lung volumes.  No acute findings. Electronically Signed   By: Lorin Picket M.D.   On: 10/10/2016 07:53     STUDIES:  CT Cervical Spine 6/15 >> Negative CT Head 6/15>> Negative  CULTURES: Urine 6/15>> Blood 6/15 >>  Urine Strep 6/15>> Urine Legionella 6/15>>  ANTIBIOTICS: Vanc 6/15>> Zosyn 6/15>>  SIGNIFICANT EVENTS: Admission 6/15 for DKA  LINES/TUBES: PIV 6/15  DISCUSSION: 31 year old male admitted with 1 week history of feeling poorly,poor appetite, frequent urination. He was found down at home with AMS. In Ed he was found to be in DKA  Serum glucose of 1650 with hyperkalemia. Insulin gtt was started per protocol. Bicarb gtt was started at 50 cc per hour. Pt. Will be transferred to the ICU for further management and treatment. Goal is to slowly bring CBG down to avoid cerebral edema.Will start D5 1/2 NS when CBG is 1000.Plan as below  ASSESSMENT / PLAN:  PULMONARY A: No Acute Issues at present P:   Oxygen for saturations > 94% as needed  CXR prn Continuous saturation monitoring   CARDIOVASCULAR A:  Hx of HTN currently untreated BP is currently sofy P: Telemetry monitoring  Maintain MAP > 65 BNP 12 Lead EKG>> In ED 10/10/16   RENAL A:   AKI Hyperkalemia Hyponatremia CK 455 ( Found down) P:   Trend BMET Q 4 Mag Phos Replete/ Correct  Electrolytes as  needed Avoid Nephrotoxic drugs Maintain renal perfusion Strict I&O Repeat CK in am Urine osm   GASTROINTESTINAL A:   Poor appetite x several days No Diarrhea per patient Elevated Lipase. Alk Phos, Total Bili P:   QQI:WLNLGX q 24 Abd [place] NPO except meds Re-check Lipase, Amylase, Alk phos , Total bili  6/16 am. CMET 6/16   HEMATOLOGIC A:   Leukocytosis P:  Trend CBC Transfuse for HGB <7 DVT prophylaxis:Heparin SQ   INFECTIOUS A:   Leukocytosis Afebrile Lactate correcting with IVF P:   Trend fever/ WBC Curve Trend PCT/Lactate Cultures as above ABX as above Follow micro for results Narrow ABX therapy once sensitivities available  ENDOCRINE A:   DKA   P:   Insulin gtt titration  off serum BMET Management per Glucomander CBG Q 1 Add D5 1/2 NS @ 50 when glucose is  1000  NEUROLOGIC A:   Slow to answer questions ? Baseline Found down at home P:   RASS goal: 0 to -1 Neuro Checks q 2     FAMILY  - Updates: Sister updated at bedside  - Inter-disciplinary family meet or Palliative Care meeting due by:  6/22.   Magdalen Spatz, AGCNP [facility] Pager: [phone]  10/10/2016, 12:24 PM

## 2016-10-11 ENCOUNTER — Inpatient Hospital Stay (HOSPITAL_COMMUNITY): Payer: Medicaid Other

## 2016-10-11 DIAGNOSIS — E1310 Other specified diabetes mellitus with ketoacidosis without coma: Secondary | ICD-10-CM

## 2016-10-11 DIAGNOSIS — E870 Hyperosmolality and hypernatremia: Secondary | ICD-10-CM

## 2016-10-11 LAB — BASIC METABOLIC PANEL
ANION GAP: 12 (ref 5–15)
Anion gap: 10 (ref 5–15)
Anion gap: 13 (ref 5–15)
Anion gap: 10 (ref 5–15)
BUN: 40 mg/dL — ABNORMAL HIGH (ref 6–20)
BUN: 39 mg/dL — ABNORMAL HIGH (ref 6–20)
BUN: 38 mg/dL — AB (ref 6–20)
BUN: 34 mg/dL — AB (ref 6–20)
CALCIUM: 10.2 mg/dL (ref 8.9–10.3)
CHLORIDE: 127 mmol/L — AB (ref 101–111)
CHLORIDE: 129 mmol/L — AB (ref 101–111)
CHLORIDE: 130 mmol/L — AB (ref 101–111)
CHLORIDE: 124 mmol/L — AB (ref 101–111)
CO2: 19 mmol/L — ABNORMAL LOW (ref 22–32)
CO2: 20 mmol/L — AB (ref 22–32)
CO2: 17 mmol/L — ABNORMAL LOW (ref 22–32)
CO2: 19 mmol/L — ABNORMAL LOW (ref 22–32)
CREATININE: 2.24 mg/dL — AB (ref 0.61–1.24)
CREATININE: 2.25 mg/dL — AB (ref 0.61–1.24)
CREATININE: 2.13 mg/dL — AB (ref 0.61–1.24)
CREATININE: 2.07 mg/dL — AB (ref 0.61–1.24)
Calcium: 10.4 mg/dL — ABNORMAL HIGH (ref 8.9–10.3)
Calcium: 10.1 mg/dL (ref 8.9–10.3)
Calcium: 9.7 mg/dL (ref 8.9–10.3)
GFR calc Af Amer: 48 mL/min — ABNORMAL LOW (ref >60)
GFR calc non Af Amer: 37 mL/min — ABNORMAL LOW (ref >60)
GFR calc non Af Amer: 37 mL/min — ABNORMAL LOW (ref >60)
GFR calc non Af Amer: 41 mL/min — ABNORMAL LOW (ref >60)
GFR, EST AFRICAN AMERICAN: 43 mL/min — AB (ref >60)
GFR, EST AFRICAN AMERICAN: 43 mL/min — AB (ref >60)
GLUCOSE: 375 mg/dL — AB (ref 65–99)
GLUCOSE: 225 mg/dL — AB (ref 65–99)
GLUCOSE: 164 mg/dL — AB (ref 65–99)
Glucose, Bld: 127 mg/dL — ABNORMAL HIGH (ref 65–99)
POTASSIUM: 4.5 mmol/L (ref 3.5–5.1)
POTASSIUM: 3.7 mmol/L (ref 3.5–5.1)
Potassium: 3.3 mmol/L — ABNORMAL LOW (ref 3.5–5.1)
Potassium: 4.2 mmol/L (ref 3.5–5.1)
Sodium: 156 mmol/L — ABNORMAL HIGH (ref 135–145)
Sodium: 161 mmol/L (ref 135–145)
Sodium: 160 mmol/L — ABNORMAL HIGH (ref 135–145)
Sodium: 153 mmol/L — ABNORMAL HIGH (ref 135–145)

## 2016-10-11 LAB — CK: Total CK: 569 U/L — ABNORMAL HIGH (ref 49–397)

## 2016-10-11 LAB — GLUCOSE, CAPILLARY
GLUCOSE-CAPILLARY: 350 mg/dL — AB (ref 65–99)
GLUCOSE-CAPILLARY: 281 mg/dL — AB (ref 65–99)
GLUCOSE-CAPILLARY: 214 mg/dL — AB (ref 65–99)
GLUCOSE-CAPILLARY: 180 mg/dL — AB (ref 65–99)
Glucose-Capillary: 119 mg/dL — ABNORMAL HIGH (ref 65–99)
Glucose-Capillary: 132 mg/dL — ABNORMAL HIGH (ref 65–99)
Glucose-Capillary: 143 mg/dL — ABNORMAL HIGH (ref 65–99)
Glucose-Capillary: 147 mg/dL — ABNORMAL HIGH (ref 65–99)
Glucose-Capillary: 153 mg/dL — ABNORMAL HIGH (ref 65–99)
Glucose-Capillary: 154 mg/dL — ABNORMAL HIGH (ref 65–99)
Glucose-Capillary: 157 mg/dL — ABNORMAL HIGH (ref 65–99)
Glucose-Capillary: 249 mg/dL — ABNORMAL HIGH (ref 65–99)
Glucose-Capillary: 257 mg/dL — ABNORMAL HIGH (ref 65–99)
Glucose-Capillary: 261 mg/dL — ABNORMAL HIGH (ref 65–99)
Glucose-Capillary: 263 mg/dL — ABNORMAL HIGH (ref 65–99)
Glucose-Capillary: 362 mg/dL — ABNORMAL HIGH (ref 65–99)
Glucose-Capillary: 389 mg/dL — ABNORMAL HIGH (ref 65–99)
Glucose-Capillary: 465 mg/dL — ABNORMAL HIGH (ref 65–99)

## 2016-10-11 LAB — URINE CULTURE: Culture: <10000 — AB

## 2016-10-11 LAB — PROCALCITONIN: PROCALCITONIN: 0.38 ng/mL

## 2016-10-11 LAB — LIPASE, BLOOD: Lipase: 1143 U/L — ABNORMAL HIGH (ref 11–51)

## 2016-10-11 LAB — AMYLASE: Amylase: 467 U/L — ABNORMAL HIGH (ref 28–100)

## 2016-10-11 LAB — BILIRUBIN, TOTAL: BILIRUBIN TOTAL: 0.7 mg/dL (ref 0.3–1.2)

## 2016-10-11 LAB — HIV ANTIBODY (ROUTINE TESTING W REFLEX): HIV SCREEN 4TH GENERATION: NONREACTIVE

## 2016-10-11 LAB — ALKALINE PHOSPHATASE: ALK PHOS: 111 U/L (ref 38–126)

## 2016-10-11 LAB — OSMOLALITY, URINE: Osmolality, Ur: 879 mOsm/kg (ref 300–900)

## 2016-10-11 MED ORDER — BENZTROPINE MESYLATE 1 MG PO TABS
0.5000 mg | ORAL_TABLET | Freq: Two times a day (BID) | ORAL | Status: DC
  Administered 2016-10-11 – 2016-10-16 (×11): 0.5 mg via ORAL
  Filled 2016-10-11 (×11): qty 1

## 2016-10-11 MED ORDER — K PHOS MONO-SOD PHOS DI & MONO 155-852-130 MG PO TABS
500.0000 mg | ORAL_TABLET | Freq: Once | ORAL | Status: AC
  Administered 2016-10-11: 500 mg via ORAL
  Filled 2016-10-11: qty 2

## 2016-10-11 MED ORDER — DEXTROSE 5 % IV SOLN
INTRAVENOUS | Status: DC
  Administered 2016-10-11: 15:00:00 via INTRAVENOUS

## 2016-10-11 MED ORDER — INSULIN ASPART 100 UNIT/ML ~~LOC~~ SOLN: 2.0000 [IU] | SUBCUTANEOUS | Status: DC

## 2016-10-11 MED ORDER — INSULIN ASPART 100 UNIT/ML ~~LOC~~ SOLN
0.0000 [IU] | SUBCUTANEOUS | Status: DC
  Administered 2016-10-11: 5 [IU] via SUBCUTANEOUS
  Administered 2016-10-11 – 2016-10-12 (×2): 9 [IU] via SUBCUTANEOUS
  Administered 2016-10-12: 7 [IU] via SUBCUTANEOUS

## 2016-10-11 MED ORDER — POTASSIUM CHLORIDE 10 MEQ/100ML IV SOLN
10.0000 meq | INTRAVENOUS | Status: AC
  Administered 2016-10-11 (×4): 10 meq via INTRAVENOUS
  Filled 2016-10-11 (×4): qty 100

## 2016-10-11 MED ORDER — BUSPIRONE HCL 15 MG PO TABS
7.5000 mg | ORAL_TABLET | Freq: Three times a day (TID) | ORAL | Status: DC
  Administered 2016-10-11 – 2016-10-13 (×7): 7.5 mg via ORAL
  Filled 2016-10-11 (×8): qty 1

## 2016-10-11 MED ORDER — INSULIN GLARGINE 100 UNIT/ML ~~LOC~~ SOLN
40.0000 [IU] | SUBCUTANEOUS | Status: DC
  Administered 2016-10-11: 40 [IU] via SUBCUTANEOUS
  Filled 2016-10-11: qty 0.4

## 2016-10-11 NOTE — Progress Notes (Signed)
CRITICAL VALUE ALERT  Critical Value:  Sodium 161  Date & Time Notied:  10/11/2016 @ 0840  Provider Notified: Dr. Vassie LollAlva

## 2016-10-11 NOTE — H&P (Signed)
[facility] / CRITICAL CARE MEDICINE   Name: Jacob Price MRN: [id number] DOB: 1985/09/09    ADMISSION DATE:  10/10/2016 CONSULTATION DATE:  10/10/2016  REFERRING MD:  Pattricia Boss, MD  CHIEF COMPLAINT: Found down at home, AMS/DKA  HISTORY OF PRESENT ILLNESS:   31 year old male with PMH significant for untreated HTN and schizophrenia/? Autism. He is followed by [facility], where he receives IM injections of Respidol Consta once every 2 weeks.. Pt. Presents to the ED 10/10/16 after having been found down by mother with AMS& new onset DKA with serum glucose of 1651.   STUDIES:  CT Cervical Spine 6/15 >> Negative CT Head 6/15>> Negative [place] abd 6/15 >> fatty liver  CULTURES: Urine 6/15>> Blood 6/15 >>  Urine Strep 6/15>>neg Urine Legionella 6/15>>  ANTIBIOTICS: Vanc 6/15>>6/16 Zosyn 6/15>> 6/16  SIGNIFICANT EVENTS: Admission 6/15 for DKA  LINES/TUBES: PIV 6/15   SUBJECTIVE:  Critically ill on insulin gtt Supine in bed, slow to respond Denies pain, afebrile Goo dUO  VITAL SIGNS: BP 124/77 (BP Location: Left Arm)   Pulse (!) 109   Temp 97.7 F (36.5 C) (Oral)   Resp 18   Ht '5\' 10"'  (1.778 m)   Wt 279 lb 15.8 oz (127 kg)   SpO2 96%   BMI 40.17 kg/m   HEMODYNAMICS:    VENTILATOR SETTINGS:    INTAKE / OUTPUT: I/O last 3 completed shifts: In: 4100.3 [I.V.:2650.3; IV Piggyback:1450] Out: 2995 [Urine:2995]  PHYSICAL EXAMINATION: General:  Arousable male, supine in bed, slow to respond( ? Baseline) Neuro:  MAE x 4, A&O x 3, PERRLA, non focal, flat affect HEENT: normocephalic, atraumatic, -JVD, no LAD, MM dry Cardiovascular:  Tachy per monitor, S1, S2, RRR, No RMG Lungs:  Clear throughout, respirations regular, fruity breath Abdomen:  Non-tender, non-distended, BS + Musculoskeletal:  No obvious deformities noted, Skin:  Warm and dry,intact, no lesions or rash noted., no mottling noted  LABS:  BMET  Recent Labs Lab 10/10/16 2246  10/11/16 0248 10/11/16 0730  NA 154* 156* 161*  K 3.8 3.3* 4.2  CL 125* 127* 129*  CO2 14* 19* 20*  BUN 43* 40* 39*  CREATININE 2.39* 2.24* 2.25*  GLUCOSE 600* 375* 225*    Electrolytes  Recent Labs Lab 10/10/16 1356  10/10/16 2246 10/11/16 0248 10/11/16 0730  CALCIUM 10.2  < > 10.0 10.2 10.4*  MG 3.5*  --   --   --   --   PHOS 1.4*  --   --   --   --   < > = values in this interval not displayed.  CBC  Recent Labs Lab 10/10/16 0719 10/10/16 1356 10/10/16 1401  WBC DUPL SEE [id number]  11.2* 12.1*  --   HGB DUPL SEE [id number]  15.1 14.8 13.6  HCT DUPL SEE [id number]  52.8* 44.4 40.0  PLT DUPL SEE [id number]  278 264  --     Coag's No results for input(s): APTT, INR in the last 168 hours.  Sepsis Markers  Recent Labs Lab 10/10/16 0739 10/10/16 1147 10/10/16 1356 10/11/16 0344  LATICACIDVEN 2.17* 0.93  --   --   PROCALCITON  --   --  0.24 0.38    ABG  Recent Labs Lab 10/10/16 0811 10/10/16 1139 10/10/16 1649  PHART 7.059* 7.040* 7.274*  PCO2ART 14.5* 17.0* 24.5*  PO2ART 60.0* 115.0* 109.0*    Liver Enzymes  Recent Labs Lab 10/10/16 0719 10/11/16 0344  AST 15  --   ALT 25  --  ALKPHOS 212* 111  BILITOT 1.8* 0.7  ALBUMIN 4.2  --     Cardiac Enzymes No results for input(s): TROPONINI, PROBNP in the last 168 hours.  Glucose  Recent Labs Lab 10/11/16 0448 10/11/16 0542 10/11/16 0647 10/11/16 0736 10/11/16 0849 10/11/16 0952  GLUCAP 263* 261* 249* 214* 180* 157*    Imaging Ct Head Wo Contrast  Result Date: 10/10/2016 CLINICAL DATA:  Fatigue and decreased appetite since last night. Altered level of consciousness. Patient found down today. EXAM: CT HEAD WITHOUT CONTRAST CT CERVICAL SPINE WITHOUT CONTRAST TECHNIQUE: Multidetector CT imaging of the head and cervical spine was performed following the standard protocol without intravenous contrast. Multiplanar CT image reconstructions of the cervical spine were also generated. COMPARISON:   None. FINDINGS: CT HEAD FINDINGS Brain: Appears normal without hemorrhage, infarct, mass lesion, mass effect, midline shift or abnormal extra-axial fluid collection. No hydrocephalus or pneumocephalus. Vascular: Negative. Skull: Intact. Sinuses/Orbits: Negative. Other: None. CT CERVICAL SPINE FINDINGS Alignment: Normal. Skull base and vertebrae: No acute fracture. No primary bone lesion or focal pathologic process. Soft tissues and spinal canal: No prevertebral fluid or swelling. No visible canal hematoma. Disc levels: Intervertebral disc space height is maintained. Mild endplate spurring X4-1 noted. Upper chest: Lung apices clear. Other: None. IMPRESSION: Negative head and cervical spine CT scans. Electronically Signed   By: Inge Rise M.D.   On: 10/10/2016 10:59   Ct Cervical Spine Wo Contrast  Result Date: 10/10/2016 CLINICAL DATA:  Fatigue and decreased appetite since last night. Altered level of consciousness. Patient found down today. EXAM: CT HEAD WITHOUT CONTRAST CT CERVICAL SPINE WITHOUT CONTRAST TECHNIQUE: Multidetector CT imaging of the head and cervical spine was performed following the standard protocol without intravenous contrast. Multiplanar CT image reconstructions of the cervical spine were also generated. COMPARISON:  None. FINDINGS: CT HEAD FINDINGS Brain: Appears normal without hemorrhage, infarct, mass lesion, mass effect, midline shift or abnormal extra-axial fluid collection. No hydrocephalus or pneumocephalus. Vascular: Negative. Skull: Intact. Sinuses/Orbits: Negative. Other: None. CT CERVICAL SPINE FINDINGS Alignment: Normal. Skull base and vertebrae: No acute fracture. No primary bone lesion or focal pathologic process. Soft tissues and spinal canal: No prevertebral fluid or swelling. No visible canal hematoma. Disc levels: Intervertebral disc space height is maintained. Mild endplate spurring O8-7 noted. Upper chest: Lung apices clear. Other: None. IMPRESSION: Negative head and  cervical spine CT scans. Electronically Signed   By: Inge Rise M.D.   On: 10/10/2016 10:59   US Abdomen Complete  Result Date: 10/10/2016 CLINICAL DATA:  Elevated lipase.  Hypertension. EXAM: ABDOMEN ULTRASOUND COMPLETE COMPARISON:  None. FINDINGS: Gallbladder: No gallstones or wall thickening visualized. No sonographic Murphy sign noted by sonographer. Common bile duct: Diameter: 2.4 mm Liver: No focal lesion identified. Echogenic liver parenchyma without biliary dilatation or focal mass. IVC: No abnormality visualized. Pancreas: Visualized portion unremarkable. Spleen: Size and appearance within normal limits. Right Kidney: Length: 12.6 cm. Echogenicity within normal limits. No mass or hydronephrosis visualized. Left Kidney: Length: 11.6 cm. Echogenicity within normal limits. No mass or hydronephrosis visualized. Abdominal aorta: No aneurysm visualized. The distal aorta was not well visualized. Maximum caliber of the aorta was 2.4 cm. Other findings: None. IMPRESSION: Echogenic liver consistent fatty infiltration. Otherwise negative exam. Electronically Signed   By: Ashley Royalty M.D.   On: 10/10/2016 17:56   Dg Chest Port 1 View  Result Date: 10/11/2016 CLINICAL DATA:  Respiratory failure EXAM: PORTABLE CHEST 1 VIEW COMPARISON:  10/10/2016 FINDINGS: Low lung volumes with bibasilar atelectasis  unchanged. Negative for heart failure or effusion. Heart size normal. IMPRESSION: Bibasilar atelectasis unchanged. Electronically Signed   By: Franchot Gallo M.D.   On: 10/11/2016 09:03       DISCUSSION: 31 year old male with new onset  DKA  Serum glucose of 1650  - Now improved but severely hypernatremic  ASSESSMENT / PLAN:   ENDOCRINE A:   New onset DKA  ? Related to risperdal vs new diagnosis of DM P:   Insulin gtt titration - can change to SQ once gtt < 6u/h CBG Q 1 dc D5 1/2 NS & bicarb gtt   PULMONARY A: No Acute Issues  P:   Oxygen for saturations > 94% as needed      CARDIOVASCULAR A:  Hx of HTN currently untreated BP is currently sofy P: Telemetry  Hold triamterene   RENAL A:   AKI Hyperkalemia Hypernatremia Hypophos  P:   Trend BMET Q 4 Replete/ Correct  Electrolytes as needed Avoid Nephrotoxic drugs Strict I&O Allow to drink water    GASTROINTESTINAL A:   Poor appetite x several days No Diarrhea per patient Elevated Lipase. Alk Phos, Total Bili P:   PWK:HIPRKS q 24 Resume PO Re-check Lipase    HEMATOLOGIC A:   Leukocytosis P:  Trend CBC Transfuse for HGB <7 DVT prophylaxis:Heparin SQ   INFECTIOUS A:   Leukocytosis Afebrile P:   Trend fever/ WBC Curve Dc abx, low pct reassuring    NEUROLOGIC A:   Slow to answer questions ? Baseline Found down at home P:   Resume home buspirone & benztropine    FAMILY  - Updates: Sister updated at bedside  - Inter-disciplinary family meet or Palliative Care meeting due by:NA   My cc time x 90m 10/11/2016, 9:58 AM

## 2016-10-11 NOTE — Progress Notes (Signed)
eLink Physician-Brief Progress Note Patient Name: [name] DOB: 03/11/1986 MRN: [id number]   Date of Service  10/11/2016  HPI/Events of Note  Hyperglycemia - Blood glucose = 257. On standard Novolog SSI.  eICU Interventions  Will order: 1. D/C Q 4 hour standard Novolog SSI.  2. Q 4 hour sensitive Novolog SSI.     Intervention Category Major Interventions: Hyperglycemia - active titration of insulin therapy  Lenell AntuSommer,Steven Eugene 10/11/2016, 8:24 PM

## 2016-10-11 NOTE — Progress Notes (Signed)
eLink Physician-Brief Progress Note Patient Name: Jacob ChampagneDevon Persico DOB: 05/12/1985 MRN: [id number]   Date of Service  10/11/2016  HPI/Events of Note  Hypokalemia  eICU Interventions  Potassium replaced     Intervention Category Intermediate Interventions: Electrolyte abnormality - evaluation and management  DETERDING,ELIZABETH 10/11/2016, 3:48 AM

## 2016-10-12 DIAGNOSIS — E0910 Drug or chemical induced diabetes mellitus with ketoacidosis without coma: Secondary | ICD-10-CM

## 2016-10-12 DIAGNOSIS — K853 Drug induced acute pancreatitis without necrosis or infection: Secondary | ICD-10-CM

## 2016-10-12 LAB — BASIC METABOLIC PANEL
Anion gap: 11 (ref 5–15)
Anion gap: 11 (ref 5–15)
BUN: 28 mg/dL — ABNORMAL HIGH (ref 6–20)
BUN: 32 mg/dL — AB (ref 6–20)
CALCIUM: 9.1 mg/dL (ref 8.9–10.3)
CALCIUM: 9.2 mg/dL (ref 8.9–10.3)
CO2: 18 mmol/L — AB (ref 22–32)
CO2: 19 mmol/L — AB (ref 22–32)
CREATININE: 1.94 mg/dL — AB (ref 0.61–1.24)
CREATININE: 2.13 mg/dL — AB (ref 0.61–1.24)
Chloride: 116 mmol/L — ABNORMAL HIGH (ref 101–111)
Chloride: 120 mmol/L — ABNORMAL HIGH (ref 101–111)
GFR calc non Af Amer: 40 mL/min — ABNORMAL LOW (ref >60)
GFR calc non Af Amer: 44 mL/min — ABNORMAL LOW (ref >60)
GFR, EST AFRICAN AMERICAN: 46 mL/min — AB (ref >60)
GFR, EST AFRICAN AMERICAN: 51 mL/min — AB (ref >60)
GLUCOSE: 375 mg/dL — AB (ref 65–99)
GLUCOSE: 414 mg/dL — AB (ref 65–99)
Potassium: 3.8 mmol/L (ref 3.5–5.1)
Potassium: 4.1 mmol/L (ref 3.5–5.1)
Sodium: 146 mmol/L — ABNORMAL HIGH (ref 135–145)
Sodium: 149 mmol/L — ABNORMAL HIGH (ref 135–145)

## 2016-10-12 LAB — CBC
HCT: 37.2 % — ABNORMAL LOW (ref 39.0–52.0)
Hemoglobin: 13.4 g/dL (ref 13.0–17.0)
MCH: 28.9 pg (ref 26.0–34.0)
MCHC: 36 g/dL (ref 30.0–36.0)
MCV: 80.3 fL (ref 78.0–100.0)
PLATELETS: 174 10*3/uL (ref 150–400)
RBC: 4.63 MIL/uL (ref 4.22–5.81)
RDW: 14.4 % (ref 11.5–15.5)
WBC: 9.6 10*3/uL (ref 4.0–10.5)

## 2016-10-12 LAB — GLUCOSE, CAPILLARY
GLUCOSE-CAPILLARY: 394 mg/dL — AB (ref 65–99)
GLUCOSE-CAPILLARY: 372 mg/dL — AB (ref 65–99)
Glucose-Capillary: 364 mg/dL — ABNORMAL HIGH (ref 65–99)
Glucose-Capillary: 307 mg/dL — ABNORMAL HIGH (ref 65–99)
Glucose-Capillary: 323 mg/dL — ABNORMAL HIGH (ref 65–99)

## 2016-10-12 LAB — PHOSPHORUS: Phosphorus: 2.7 mg/dL (ref 2.5–4.6)

## 2016-10-12 LAB — HEMOGLOBIN A1C: Hgb A1c MFr Bld: >15.5 % — ABNORMAL HIGH (ref 4.8–5.6)

## 2016-10-12 LAB — MAGNESIUM: MAGNESIUM: 2.1 mg/dL (ref 1.7–2.4)

## 2016-10-12 LAB — TRIGLYCERIDES: Triglycerides: 365 mg/dL — ABNORMAL HIGH (ref <150)

## 2016-10-12 LAB — LIPASE, BLOOD: Lipase: 269 U/L — ABNORMAL HIGH (ref 11–51)

## 2016-10-12 MED ORDER — INSULIN GLARGINE 100 UNIT/ML ~~LOC~~ SOLN
20.0000 [IU] | Freq: Two times a day (BID) | SUBCUTANEOUS | Status: DC
  Administered 2016-10-12 (×2): 20 [IU] via SUBCUTANEOUS
  Filled 2016-10-12 (×3): qty 0.2

## 2016-10-12 MED ORDER — SODIUM CHLORIDE 0.9 % IV SOLN
INTRAVENOUS | Status: DC
  Administered 2016-10-12: 17:00:00 via INTRAVENOUS

## 2016-10-12 MED ORDER — INSULIN ASPART 100 UNIT/ML ~~LOC~~ SOLN
0.0000 [IU] | Freq: Every day | SUBCUTANEOUS | Status: DC
  Administered 2016-10-12: 5 [IU] via SUBCUTANEOUS
  Administered 2016-10-13 – 2016-10-15 (×2): 2 [IU] via SUBCUTANEOUS

## 2016-10-12 MED ORDER — INSULIN ASPART 100 UNIT/ML ~~LOC~~ SOLN
0.0000 [IU] | Freq: Three times a day (TID) | SUBCUTANEOUS | Status: DC
  Administered 2016-10-12: 15 [IU] via SUBCUTANEOUS
  Administered 2016-10-12 – 2016-10-13 (×2): 20 [IU] via SUBCUTANEOUS
  Administered 2016-10-13: 11 [IU] via SUBCUTANEOUS
  Administered 2016-10-13: 20 [IU] via SUBCUTANEOUS
  Administered 2016-10-14: 7 [IU] via SUBCUTANEOUS
  Administered 2016-10-14: 4 [IU] via SUBCUTANEOUS
  Administered 2016-10-14: 11 [IU] via SUBCUTANEOUS
  Administered 2016-10-15: 7 [IU] via SUBCUTANEOUS
  Administered 2016-10-15: 11 [IU] via SUBCUTANEOUS
  Administered 2016-10-15: 4 [IU] via SUBCUTANEOUS
  Administered 2016-10-16: 7 [IU] via SUBCUTANEOUS

## 2016-10-12 MED ORDER — HEPARIN SODIUM (PORCINE) 5000 UNIT/ML IJ SOLN
5000.0000 [IU] | Freq: Three times a day (TID) | INTRAMUSCULAR | Status: DC
  Administered 2016-10-12 – 2016-10-16 (×12): 5000 [IU] via SUBCUTANEOUS
  Filled 2016-10-12 (×11): qty 1

## 2016-10-12 NOTE — Progress Notes (Addendum)
[facility] / CRITICAL CARE MEDICINE   Name: Jacob Price MRN: [id number] DOB: [birth year]    ADMISSION DATE:  10/10/2016 CONSULTATION DATE:  10/10/2016  REFERRING MD:  Pattricia Boss, MD  CHIEF COMPLAINT: Found down at home, AMS/DKA  HISTORY OF PRESENT ILLNESS:   31 year old male with PMH significant for untreated HTN and schizophrenia/? Autism. He is followed by [facility], where he receives IM injections of Respidol Consta once every 2 weeks.. Pt. Presents to the ED 10/10/16 after having been found down by mother with AMS& new onset DKA with serum glucose of 1651.   STUDIES:  CT Cervical Spine 6/15 >> Negative CT Head 6/15>> Negative [place] abd 6/15 >> fatty liver  CULTURES: Urine 6/15>>ng Blood 6/15 >> ng Urine Strep 6/15>>neg Urine Legionella 6/15>>  ANTIBIOTICS: Vanc 6/15>>6/16 Zosyn 6/15>> 6/16  SIGNIFICANT EVENTS: Admission 6/15 for DKA 6/16 off insulin gtt @ 5p  LINES/TUBES: PIV 6/15   SUBJECTIVE:  Off insulin gtt Denies pain, afebrile Sugars remain high  VITAL SIGNS: BP (!) 144/78   Pulse 99   Temp 99.1 F (37.3 C) (Oral)   Resp 19   Ht '5\' 10"'  (1.778 m)   Wt 279 lb 15.8 oz (127 kg)   SpO2 97%   BMI 40.17 kg/m   HEMODYNAMICS:    VENTILATOR SETTINGS:    INTAKE / OUTPUT: I/O last 3 completed shifts: In: 4771.2 [P.O.:1422; I.V.:2899.2; IV Piggyback:450] Out: 1865 [Urine:1865]  PHYSICAL EXAMINATION: General:  Arousable male,oob to cahir , slow to respond which seems to be Baseline Neuro:  MAE x 4, A&O x 3, PERRLA, non focal, flat affect HEENT: normocephalic, atraumatic, -JVD, no LAD, MM dry Cardiovascular:   S1, S2, RRR, No RMG Lungs:  Clear throughout, respirations regular, fruity breath Abdomen:  Non-tender, non-distended, BS + Musculoskeletal:  No obvious deformities noted, Skin:  Warm and dry,intact, no lesions or rash noted., no mottling noted  LABS:  BMET  Recent Labs Lab 10/11/16 1615 10/12/16 0044 10/12/16 0307  NA  153* 146* 149*  K 3.7 3.8 4.1  CL 124* 116* 120*  CO2 19* 19* 18*  BUN 34* 32* 28*  CREATININE 2.07* 2.13* 1.94*  GLUCOSE 164* 414* 375*    Electrolytes  Recent Labs Lab 10/10/16 1356  10/11/16 1615 10/12/16 0044 10/12/16 0307  CALCIUM 10.2  < > 9.7 9.1 9.2  MG 3.5*  --   --   --  2.1  PHOS 1.4*  --   --   --  2.7  < > = values in this interval not displayed.  CBC  Recent Labs Lab 10/10/16 0719 10/10/16 1356 10/10/16 1401 10/12/16 0307  WBC DUPL SEE [id number]  11.2* 12.1*  --  9.6  HGB DUPL SEE [id number]  15.1 14.8 13.6 13.4  HCT DUPL SEE [id number]  52.8* 44.4 40.0 37.2*  PLT DUPL SEE [id number]  278 264  --  174    Coag's No results for input(s): APTT, INR in the last 168 hours.  Sepsis Markers  Recent Labs Lab 10/10/16 0739 10/10/16 1147 10/10/16 1356 10/11/16 0344  LATICACIDVEN 2.17* 0.93  --   --   PROCALCITON  --   --  0.24 0.38    ABG  Recent Labs Lab 10/10/16 0811 10/10/16 1139 10/10/16 1649  PHART 7.059* 7.040* 7.274*  PCO2ART 14.5* 17.0* 24.5*  PO2ART 60.0* 115.0* 109.0*    Liver Enzymes  Recent Labs Lab 10/10/16 0719 10/11/16 0344  AST 15  --   ALT 25  --  ALKPHOS 212* 111  BILITOT 1.8* 0.7  ALBUMIN 4.2  --     Cardiac Enzymes No results for input(s): TROPONINI, PROBNP in the last 168 hours.  Glucose  Recent Labs Lab 10/11/16 1528 10/11/16 1629 10/11/16 1927 10/11/16 2336 10/12/16 0453 10/12/16 0836  GLUCAP 153* 154* 257* 362* 364* 307*    Imaging No results found.     DISCUSSION: 31 year old male with new onset  DKA  Serum glucose of 1650  - Now improved but sugars remain high  ASSESSMENT / PLAN:   ENDOCRINE A:   New onset DKA  ? Related to risperdal vs new diagnosis of DM P:   Received 40 U lantus @ 5p on 6/16  Change to 20 U q 12h & resistant scale Advance to clear liquids   CARDIOVASCULAR A:  Hx of HTN  P: Telemetry  Resume triamterene once BP rises   RENAL A:    AKI Hyperkalemia Hypernatremia -improving Hypophos  P:   Trend BMET Q 12 Replete/ Correct  Electrolytes as needed    GASTROINTESTINAL A:   Acute pancreatitis - no abdominal pain ,not clear if this was primary cause Elevated Lipase. Alk Phos, Total Bili P:   Re-check Lipase & establish downward trend before advancing diet Check TGs for completion May need CT abdomen when renal failure improved   HEMATOLOGIC A:   Leukocytosis P:  Trend CBC Transfuse for HGB <7 DVT prophylaxis:Heparin SQ   INFECTIOUS A:   Leukocytosis Afebrile P:    Dc abx, low pct reassuring    NEUROLOGIC A:  Acute encephalopathy - resolved Slow to answer questions seems to be Baseline  P:   Resume home buspirone & benztropine & low dose risperdal    FAMILY  - Updates: Sister updated at bedside  - Inter-disciplinary family meet or Palliative Care meeting due by:NA  Transfer to tele & to triad 6/18   Kara Mead MD. [facility]. [facility] Pager [phone] If no response call [phone]     10/12/2016, 10:24 AM

## 2016-10-12 NOTE — Progress Notes (Signed)
NURSING PROGRESS NOTE  Eliezer ChampagneDevon Danh [id number] Transfer Data: 10/12/2016 5:13 PM Attending Provider: Roslynn AmbleNestor, Jennings E, MD WUJ:[id number]:Patient, No Pcp Per Code Status: Full  Eliezer ChampagneDevon Leise is a 31 y.o. male patient transferred from [facility]  -No acute distress noted.  -No complaints of shortness of breath.  -No complaints of chest pain.   Cardiac Monitoring: Box # 02 in place. Cardiac monitor yields: NSR to ST  Blood pressure 136/76, pulse (!) 105, temperature 99.2 F (37.3 C), temperature source Oral, resp. rate 20, height 5\' 10"  (1.778 m), weight 127 kg (279 lb 15.8 oz), SpO2 99 %.   IV Fluids: 2  IV's in place, occlusive dsgs intact without redness, IV cath locations Right hand and Right forearm Meds; iv fluids NS infusing at 10 cc hour   Allergies:  Patient has no known allergies.  Past Medical History:   has a past medical history of Hypertension.  Past Surgical History:   has no past surgical history on file.  Social History:   reports that he has never smoked. He has never used smokeless tobacco. He reports that he does not drink alcohol or use drugs.  Skin: Intact  Patient/Family orientated to room. Information packet given to patient/family. Admission inpatient armband information verified with patient/family to include name and date of birth and placed on patient arm. Side rails up x 2, fall assessment and education completed with patient/family. Patient/family able to verbalize understanding of risk associated with falls and verbalized understanding to call for assistance before getting out of bed. Call light within reach. Patient/family able to voice and demonstrate understanding of unit orientation instructions.    Will continue to evaluate and treat per MD orders.

## 2016-10-13 DIAGNOSIS — F259 Schizoaffective disorder, unspecified: Secondary | ICD-10-CM

## 2016-10-13 DIAGNOSIS — E1110 Type 2 diabetes mellitus with ketoacidosis without coma: Principal | ICD-10-CM

## 2016-10-13 LAB — BASIC METABOLIC PANEL
ANION GAP: 11 (ref 5–15)
BUN: 16 mg/dL (ref 6–20)
CO2: 23 mmol/L (ref 22–32)
Calcium: 9 mg/dL (ref 8.9–10.3)
Chloride: 113 mmol/L — ABNORMAL HIGH (ref 101–111)
Creatinine, Ser: 1.43 mg/dL — ABNORMAL HIGH (ref 0.61–1.24)
GFR calc Af Amer: >60 mL/min (ref >60)
GFR calc non Af Amer: >60 mL/min (ref >60)
GLUCOSE: 315 mg/dL — AB (ref 65–99)
Potassium: 3.7 mmol/L (ref 3.5–5.1)
Sodium: 147 mmol/L — ABNORMAL HIGH (ref 135–145)

## 2016-10-13 LAB — MAGNESIUM: Magnesium: 2.1 mg/dL (ref 1.7–2.4)

## 2016-10-13 LAB — GLUCOSE, CAPILLARY
GLUCOSE-CAPILLARY: 215 mg/dL — AB (ref 65–99)
Glucose-Capillary: 272 mg/dL — ABNORMAL HIGH (ref 65–99)
Glucose-Capillary: 276 mg/dL — ABNORMAL HIGH (ref 65–99)
Glucose-Capillary: 292 mg/dL — ABNORMAL HIGH (ref 65–99)
Glucose-Capillary: 354 mg/dL — ABNORMAL HIGH (ref 65–99)
Glucose-Capillary: 383 mg/dL — ABNORMAL HIGH (ref 65–99)

## 2016-10-13 LAB — CBC
HEMATOCRIT: 33.6 % — AB (ref 39.0–52.0)
Hemoglobin: 11.3 g/dL — ABNORMAL LOW (ref 13.0–17.0)
MCH: 27.8 pg (ref 26.0–34.0)
MCHC: 33.6 g/dL (ref 30.0–36.0)
MCV: 82.6 fL (ref 78.0–100.0)
PLATELETS: 181 10*3/uL (ref 150–400)
RBC: 4.07 MIL/uL — ABNORMAL LOW (ref 4.22–5.81)
RDW: 14.3 % (ref 11.5–15.5)
WBC: 6.5 10*3/uL (ref 4.0–10.5)

## 2016-10-13 LAB — PHOSPHORUS: Phosphorus: 2.3 mg/dL — ABNORMAL LOW (ref 2.5–4.6)

## 2016-10-13 LAB — LIPASE, BLOOD: LIPASE: 146 U/L — AB (ref 11–51)

## 2016-10-13 MED ORDER — FAMOTIDINE 20 MG PO TABS: 20.0000 mg | ORAL_TABLET | Freq: Every day | ORAL | Status: DC

## 2016-10-13 MED ORDER — INSULIN STARTER KIT- SYRINGES (ENGLISH)
1.0000 | Freq: Once | Status: AC
  Administered 2016-10-13: 1
  Filled 2016-10-13: qty 1

## 2016-10-13 MED ORDER — RISPERIDONE 1 MG PO TABS
1.0000 mg | ORAL_TABLET | Freq: Every day | ORAL | Status: DC
Start: 2016-10-13 — End: 2016-10-16
  Administered 2016-10-13 – 2016-10-15 (×3): 1 mg via ORAL
  Filled 2016-10-13 (×3): qty 1

## 2016-10-13 MED ORDER — BUSPIRONE HCL 5 MG PO TABS
5.0000 mg | ORAL_TABLET | Freq: Two times a day (BID) | ORAL | Status: DC
  Administered 2016-10-13 – 2016-10-16 (×6): 5 mg via ORAL
  Filled 2016-10-13 (×8): qty 1

## 2016-10-13 MED ORDER — INSULIN GLARGINE 100 UNIT/ML ~~LOC~~ SOLN
30.0000 [IU] | Freq: Two times a day (BID) | SUBCUTANEOUS | Status: DC
  Administered 2016-10-13 – 2016-10-14 (×3): 30 [IU] via SUBCUTANEOUS
  Filled 2016-10-13 (×4): qty 0.3

## 2016-10-13 NOTE — Progress Notes (Signed)
PROGRESS NOTE    Jacob Price  ZOX:[id number] DOB: [birth year] DOA: 10/10/2016 PCP: Patient, No Pcp Per  Brief Narrative: 30/M with Schizoaffective disorder, Autism spectrum d/o, Anxiety, referred from [facility] to ED, he was found down and encephalopathic, fount to have Severe DKA, AKI, CBG 1651 Transferred from P[facility] to [facility] today  Assessment & Plan:     DKA -new DM -Treated with Insulin gtt, IVF -started lantus, SSI -increase lantus, SSI -Hba1c 15.5 -home health RN for med management, concerned abt his cognition and need for Insulin, Insulin Pen will be the safer option -Insulin teaching    Schizoaffective disorder (HCC)/  Autism spectrum disorder/ Generalized anxiety disorder -cut down buspar and risperidone due to lethargy -continue cogentin  DVT prophylaxis: Hep SQ Code Status: Full Code Family Communication: None at bedside Disposition Plan: Home in 1-2days   Subjective: Feels ok, no N/V, no abd pain,   Objective: Vitals:   10/12/16 1405 10/12/16 2228 10/13/16 0622 10/13/16 1249  BP: 136/76 129/72 133/65 (!) 141/77  Pulse: (!) 105 98 95 100  Resp:  18 18 18   Temp: 99.2 F (37.3 C) 99.5 F (37.5 C) 99.8 F (37.7 C) 99.5 F (37.5 C)  TempSrc: Oral     SpO2: 99% 100% 97% 98%  Weight:      Height:        Intake/Output Summary (Last 24 hours) at 10/13/16 1303 Last data filed at 10/13/16 1141  Gross per 24 hour  Intake            568.5 ml  Output             3250 ml  Net          -2681.5 ml   Filed Weights   10/10/16 1400  Weight: 127 kg (279 lb 15.8 oz)    Examination:  Gen: Awake, Alert, Oriented X 2, slow cognitively HEENT: PERRLA, Neck supple, no JVD Lungs: Good air movement bilaterally, CTAB CVS: RRR,No Gallops,Rubs or new Murmurs Abd: soft, Non tender, non distended, BS present Extremities: No Cyanosis, Clubbing or edema Skin: no new rashes    Data Reviewed:   CBC:  Recent Labs Lab 10/10/16 0719 10/10/16 1356 10/10/16 1401  10/12/16 0307 10/13/16 0609  WBC DUPL SEE [id number]  11.2* 12.1*  --  9.6 6.5  NEUTROABS 9.6* 9.0*  --   --   --   HGB DUPL SEE [id number]  15.1 14.8 13.6 13.4 11.3*  HCT DUPL SEE [id number]  52.8* 44.4 40.0 37.2* 33.6*  MCV DUPL SEE [id number]  99.6 85.4  --  80.3 82.6  PLT DUPL SEE [id number]  278 264  --  174 181   Basic Metabolic Panel:  Recent Labs Lab 10/10/16 1356  10/11/16 1218 10/11/16 1615 10/12/16 0044 10/12/16 0307 10/13/16 0609  NA 152*  < > 160* 153* 146* 149* 147*  K 3.5  < > 4.5 3.7 3.8 4.1 3.7  CL 121*  < > 130* 124* 116* 120* 113*  CO2 10*  < > 17* 19* 19* 18* 23  GLUCOSE 820*  < > 127* 164* 414* 375* 315*  BUN 50*  < > 38* 34* 32* 28* 16  CREATININE 2.65*  < > 2.13* 2.07* 2.13* 1.94* 1.43*  CALCIUM 10.2  < > 10.1 9.7 9.1 9.2 9.0  MG 3.5*  --   --   --   --  2.1 2.1  PHOS 1.4*  --   --   --   --  2.7  2.3*  < > = values in this interval not displayed. GFR: Estimated Creatinine Clearance: 100.1 mL/min (A) (by C-G formula based on SCr of 1.43 mg/dL (H)). Liver Function Tests:  Recent Labs Lab 10/10/16 0719 10/11/16 0344  AST 15  --   ALT 25  --   ALKPHOS 212* 111  BILITOT 1.8* 0.7  PROT 7.5  --   ALBUMIN 4.2  --     Recent Labs Lab 10/10/16 0719 10/11/16 0344 10/11/16 1603 10/12/16 1159 10/13/16 0609  LIPASE 767*  --  1,143* 269* 146*  AMYLASE  --  467*  --   --   --    No results for input(s): AMMONIA in the last 168 hours. Coagulation Profile: No results for input(s): INR, PROTIME in the last 168 hours. Cardiac Enzymes:  Recent Labs Lab 10/10/16 0719 10/11/16 0344  CKTOTAL 455* 569*   BNP (last 3 results) No results for input(s): PROBNP in the last 8760 hours. HbA1C:  Recent Labs  10/10/16 1356  HGBA1C >15.5*   CBG:  Recent Labs Lab 10/12/16 2027 10/13/16 0012 10/13/16 0407 10/13/16 0759 10/13/16 1226  GLUCAP 372* 272* 276* 292* 383*   Lipid Profile:  Recent Labs  10/12/16 1159  TRIG 365*   Thyroid Function Tests: No  results for input(s): TSH, T4TOTAL, FREET4, T3FREE, THYROIDAB in the last 72 hours. Anemia Panel: No results for input(s): VITAMINB12, FOLATE, FERRITIN, TIBC, IRON, RETICCTPCT in the last 72 hours. Urine analysis:    Component Value Date/Time   COLORURINE YELLOW 10/10/2016 0642   APPEARANCEUR CLEAR 10/10/2016 0642   LABSPEC 1.026 10/10/2016 0642   PHURINE 5.0 10/10/2016 0642   GLUCOSEU >=500 (A) 10/10/2016 0642   HGBUR MODERATE (A) 10/10/2016 0642   BILIRUBINUR NEGATIVE 10/10/2016 0642   KETONESUR 20 (A) 10/10/2016 0642   PROTEINUR 30 (A) 10/10/2016 0642   UROBILINOGEN 0.2 07/29/2014 0602   NITRITE NEGATIVE 10/10/2016 0642   LEUKOCYTESUR NEGATIVE 10/10/2016 0642   Sepsis Labs: @LABRCNTIP (procalcitonin:4,lacticidven:4)  ) Recent Results (from the past 240 hour(s))  Urine culture     Status: Abnormal   Collection Time: 10/10/16  6:42 AM  Result Value Ref Range Status   Specimen Description URINE, CATHETERIZED  Final   Special Requests NONE  Final   Culture <10,000 COLONIES/mL INSIGNIFICANT GROWTH (A)  Final   Report Status 10/11/2016 FINAL  Final  Blood Culture (routine x 2)     Status: None (Preliminary result)   Collection Time: 10/10/16  7:19 AM  Result Value Ref Range Status   Specimen Description BLOOD LEFT HAND  Final   Special Requests IN PEDIATRIC BOTTLE Blood Culture adequate volume  Final   Culture NO GROWTH 2 DAYS  Final   Report Status PENDING  Incomplete  Blood Culture (routine x 2)     Status: None (Preliminary result)   Collection Time: 10/10/16  8:02 AM  Result Value Ref Range Status   Specimen Description BLOOD LEFT ANTECUBITAL  Final   Special Requests IN PEDIATRIC BOTTLE Blood Culture adequate volume  Final   Culture NO GROWTH 2 DAYS  Final   Report Status PENDING  Incomplete  MRSA PCR Screening     Status: None   Collection Time: 10/10/16  1:45 PM  Result Value Ref Range Status   MRSA by PCR NEGATIVE NEGATIVE Final    Comment:        The GeneXpert  MRSA Assay (FDA approved for NASAL specimens only), is one component of a comprehensive MRSA colonization surveillance program.  It is not intended to diagnose MRSA infection nor to guide or monitor treatment for MRSA infections.          Radiology Studies: No results found.      Scheduled Meds: . benztropine  0.5 mg Oral BID  . busPIRone  7.5 mg Oral TID  . heparin subcutaneous  5,000 Units Subcutaneous Q8H  . insulin aspart  0-20 Units Subcutaneous TID WC  . insulin aspart  0-5 Units Subcutaneous QHS  . insulin glargine  30 Units Subcutaneous BID  . risperiDONE  2 mg Oral QHS   Continuous Infusions: . sodium chloride    . sodium chloride 10 mL/hr at 10/12/16 1716     LOS: 3 days    Time spent: 35min    Zannie CovePreetha Anyely Cunning, MD [facility] Pager [phone]  If 7PM-7AM, please contact night-coverage [email or web address] Password [email or web address] 10/13/2016, 1:03 PM

## 2016-10-13 NOTE — Progress Notes (Signed)
   10/13/16 1020  Clinical Encounter Type  Visited With Patient  Visit Type Other (Comment) ([place] consult)  Spiritual Encounters  Spiritual Needs Emotional  Stress Factors  Patient Stress Factors None identified  Introduction to Pt. Pt declined AD.

## 2016-10-13 NOTE — Care Management Note (Addendum)
Case Management Note  Patient Details  Name: Jacob Price MRN: [id number] Date of Birth: 08/11/1985  Subjective/Objective:     DKA, Schizoaffective Disorder, autism disorder               Action/Plan: Discharge Planning: Pt states he lives alone in his low-subsidy housing apt. Pt states it is best to call his mother, Hunt Orisara Caddell # [phone]. Attempted call to mother x2, left message for return call. Attempted call to grandmother, Elder LoveWrienna Katt # [phone]. Left message for return call. [facility] with Disease Management. No PCP listed. [facility] once NCM has spoken to mother or grand-mother.   10/13/2016 7:08 pm. NCM received call from grandmother, Ms [name] and states she can pick him up at dc. Grand-mother states mother is out of town. She believes he is not safe at home with taking insulin and being reliable to taking meds. She best feels he is appropriate for a group home. CSW referral for information on getting pt in a group home.   10/13/2016 7:32 pm NCM received call from grandmother, states she will allow pt live with her for a couple of weeks until mother comes back on Sat. She can pick him up after 4 pm tomorrow. She works part-time. Grand-mother [facility].    Expected Discharge Date:                  Expected Discharge Plan:  Home w Home Health Services  In-House Referral:  NA  Discharge planning Services  CM Consult  Post Acute Care Choice:  Home Health Choice offered to:  Parent  DME Arranged:  N/A DME Agency:  NA  HH Arranged:  RN HH Agency:     Status of Service:  In process, will continue to follow  If discussed at Long Length of Stay Meetings, dates discussed:    Additional Comments:  Elliot CousinShavis, Yeraldy Spike Ellen, RN 10/13/2016, 7:01 PM

## 2016-10-13 NOTE — Progress Notes (Addendum)
Inpatient Diabetes Program Recommendations  AACE/ADA: New Consensus Statement on Inpatient Glycemic Control (2015)  Target Ranges:  Prepandial:   less than 140 mg/dL      Peak postprandial:   less than 180 mg/dL (1-2 hours)      Critically ill patients:  140 - 180 mg/dL   Lab Results  Component Value Date   KKOECX 507 (H) 10/13/2016   HGBA1C >15.5 (H) 10/10/2016    Review of Glycemic Control  Diabetes history: No prior hx known Current orders for Inpatient glycemic control: Lantus 30 units and resistant correction tidwc  Inpatient Diabetes Program Recommendations:    Noted patient taking various medications for mental disabilities, some of which may cause insulin resistance. May not be ideal for patient to administer his own insulin even via pen.  Will ask RN to assist in teaching how to use the pen and assess patient potential for ability. Noted pen starter kit ordered. Will talk with patient, mother if available and RN. May want to order a C-peptide which may be helpful in assessing if patient is making his own insulin although this can read low due to beta cell exhaustion. Will talk with care management as well. (have not been able to speak with care mgmt today).  AD Talked briefly with patient about checking his blood sugars and ability to give himself insulin. He stated that he is "use to needles"  However this is not truly the issue. The issue of his ability to recognize low blood sugars and treatments, to know when to check his cbg's and how to use that information. Patient may well be type 2 and respond to the Lantus for a basal insulin once a day and possibly an oral agent to try for initial discharge.(Metformin XR, low dose Amaryl or Glucotrol)  Not sure that patient would be able to respond to a cbg reading with correction insulin and/or meal coverage. Other concern would be ability to identify and treat hypoglycemiaSpoke with dietician working with the patient, who agrees that it  may be very difficult for patient to take this responsibility on his own at least initially he would need some vigilance by a family member or other. Will have our inpatient team follow. Requested RN to have patient watch the pen ed video and have him check his own cbg's when scheduled and assess ability. Will follow  Thank you Rosita Kea, RN, MSN, CDE  Diabetes Inpatient Program Office: [phone] Pager: [phone] 8:00 am to 5:00 pm

## 2016-10-13 NOTE — Plan of Care (Signed)
Problem: Food- and Nutrition-Related Knowledge Deficit (NB-1.1) Goal: Nutrition education Formal process to instruct or train a patient/client in a skill or to impart knowledge to help patients/clients voluntarily manage or modify food choices and eating behavior to maintain or improve health. Outcome: Adequate for Discharge  RD consulted for nutrition education regarding diabetes.   Lab Results  Component Value Date   HGBA1C >15.5 (H) 10/10/2016   Spoke with pt at bedside, who is slow to respond to questions. Pt reports poor appetite 2-3 days PTA, due to "feeling bad", however, pt reports consuming almost all food provided to him here. He reports good appetite at home prior to acute illness, however, difficult to elicit exact frequency of meals and frequently consumed foods. Pt did share with this RD that he was doing own food prep and grocery shopping PTA, however, his sister graciously volunteered to help him with grocery shopping and meal prep during hospitalization. Pt reports commonly consumed beverages are milk, juice, fruit punch, and green tea. He has also contemplated drinking bottled water and diet soda. RD spent most of visit discussing options for low calorie beverages and rationale for limiting high sugar containing beverages. Also discussed importance of consuming 3 meals per day.   Case discussed with DM coordinator, who also shares RD's concern of pt being able to comprehend basic self-management skills needed to best optimize glycemic control. Pt may best benefit from an oral agent and basal insulin to best control blood sugars given limited comprehension, however, both DM coordinator and RD are concerned about pt's ability to administer insulin at home and monitor for signs/symptoms of hypoglycemia. Unsure of home situation and level of support at this time. Will attempt to educate and obtain further information from family members if they visit the hospital. Informed pt that family  members are free to call RD with further questions and concerns.  RD provided "Carbohydrate Counting for People with Diabetes" handout from the Academy of Nutrition and Dietetics. Discussed different food groups and their effects on blood sugar, emphasizing carbohydrate-containing foods. Provided list of carbohydrates and recommended serving sizes of common foods.  Discussed importance of controlled and consistent carbohydrate intake throughout the day. Provided examples of ways to balance meals/snacks and encouraged intake of high-fiber, whole grain complex carbohydrates. Teach back method used.  Expect fair compliance.  Body mass index is 40.17 kg/m. Pt meets criteria for extreme obesity, class III based on current BMI.  Current diet order is Carb Modified, patient is consuming approximately 75% of meals at this time. Labs and medications reviewed. No further nutrition interventions warranted at this time. RD contact information provided. If additional nutrition issues arise, please re-consult RD.  Klaira Pesci A. Mayford KnifeWilliams, RD, LDN, CDE Pager: [phone] After hours Pager: [phone]

## 2016-10-14 LAB — GLUCOSE, CAPILLARY
GLUCOSE-CAPILLARY: 222 mg/dL — AB (ref 65–99)
GLUCOSE-CAPILLARY: 194 mg/dL — AB (ref 65–99)
GLUCOSE-CAPILLARY: 200 mg/dL — AB (ref 65–99)
Glucose-Capillary: 276 mg/dL — ABNORMAL HIGH (ref 65–99)

## 2016-10-14 LAB — BASIC METABOLIC PANEL
Anion gap: 10 (ref 5–15)
BUN: 17 mg/dL (ref 6–20)
CALCIUM: 9.2 mg/dL (ref 8.9–10.3)
CO2: 22 mmol/L (ref 22–32)
CREATININE: 1.25 mg/dL — AB (ref 0.61–1.24)
Chloride: 112 mmol/L — ABNORMAL HIGH (ref 101–111)
GFR calc Af Amer: >60 mL/min (ref >60)
GFR calc non Af Amer: >60 mL/min (ref >60)
GLUCOSE: 238 mg/dL — AB (ref 65–99)
POTASSIUM: 3.5 mmol/L (ref 3.5–5.1)
Sodium: 144 mmol/L (ref 135–145)

## 2016-10-14 LAB — CBC
HCT: 33.4 % — ABNORMAL LOW (ref 39.0–52.0)
HEMOGLOBIN: 11.2 g/dL — AB (ref 13.0–17.0)
MCH: 28.1 pg (ref 26.0–34.0)
MCHC: 33.5 g/dL (ref 30.0–36.0)
MCV: 83.9 fL (ref 78.0–100.0)
PLATELETS: 182 10*3/uL (ref 150–400)
RBC: 3.98 MIL/uL — ABNORMAL LOW (ref 4.22–5.81)
RDW: 14.1 % (ref 11.5–15.5)
WBC: 7.1 10*3/uL (ref 4.0–10.5)

## 2016-10-14 MED ORDER — INSULIN GLARGINE 100 UNIT/ML ~~LOC~~ SOLN
35.0000 [IU] | Freq: Two times a day (BID) | SUBCUTANEOUS | Status: DC
  Administered 2016-10-14: 35 [IU] via SUBCUTANEOUS
  Filled 2016-10-14 (×2): qty 0.35

## 2016-10-14 NOTE — Progress Notes (Signed)
Hospital post follow up appointment scheduled for July 9th, 2018 at 8:45 am with Sindy Messingoger Gomez PA [facility]. Gae GallopAngela Rojelio Uhrich RN,BSN,CM

## 2016-10-14 NOTE — Progress Notes (Signed)
CM attempted call to grandmother x 2, Elder LoveWrienna Birdwell # [phone] to discuss d/c plan, voice message left, CM awaiting call back. Gae GallopAngela Brena Windsor RN,BSN,CM

## 2016-10-14 NOTE — Progress Notes (Signed)
Pt. Was educated on diabetes administration and proper technique to sticking self. Pt. Was unable to perform task without assistance. He will need daily assistance with administration of insulin. Pt. May struggle with calculating insulin needs based off of CBG reading. As well as sticking self deep enough.

## 2016-10-14 NOTE — Progress Notes (Signed)
PROGRESS NOTE    Jacob Price  HQI:[id number] DOB: [birth year] DOA: 10/10/2016 PCP: Patient, No Pcp Per  Brief Narrative: 30/M with Schizoaffective disorder, Autism spectrum d/o, Anxiety, referred from [facility] to ED, he was found down and encephalopathic, fount to have Severe DKA, AKI, CBG 1651 Transferred [facility] to [facility] 6/18  Assessment & Plan:     DKA -new DM -Treated with Insulin gtt, IVF -started lantus, SSI, increase dose to 35units BID -Hba1c 15.5 -Unfortunately really needs Insulin, but with Autism and mental health issues unable to safely manage this by himself and recognize symptoms of hypoglycemia -discussed with CM and CSW, either needs to stay with family that can safely manage his Insulin etc or needs ALF    Schizoaffective disorder (HCC)/  Autism spectrum disorder/ Generalized anxiety disorder -cut down buspar and risperidone due to lethargy -continue cogentin -stable  DVT prophylaxis: Hep SQ Code Status: Full Code Family Communication: None at bedside Disposition Plan: Pending Safe disposition, Home with family who could be responsible for Insulin admin vs ALF   Subjective: Feels N/V, no abd pain, eating some  Objective: Vitals:   10/13/16 1249 10/13/16 2112 10/14/16 0528 10/14/16 0857  BP: (!) 141/77 (!) 146/81 125/68 127/82  Pulse: 100 93 97 (!) 105  Resp: 18 20 18 18   Temp: 99.5 F (37.5 C) 98.5 F (36.9 C) 98.9 F (37.2 C) 98.7 F (37.1 C)  TempSrc:    Oral  SpO2: 98% 97% 98% 98%  Weight:      Height:        Intake/Output Summary (Last 24 hours) at 10/14/16 1251 Last data filed at 10/14/16 1207  Gross per 24 hour  Intake              720 ml  Output             2440 ml  Net            -1720 ml   Filed Weights   10/10/16 1400  Weight: 127 kg (279 lb 15.8 oz)    Examination:  Gen: Awake, Alert, slow cognition, oriented to self, place HEENT: PERRLA, Neck supple, no JVD Lungs: Good air movement bilaterally, CTAB CVS: RRR,No  Gallops,Rubs or new Murmurs Abd: soft, Non tender, non distended, BS present Extremities: No Cyanosis, Clubbing or edema Skin: no new rashes     Data Reviewed:   CBC:  Recent Labs Lab 10/10/16 0719 10/10/16 1356 10/10/16 1401 10/12/16 0307 10/13/16 0609 10/14/16 0637  WBC DUPL SEE [id number]  11.2* 12.1*  --  9.6 6.5 7.1  NEUTROABS 9.6* 9.0*  --   --   --   --   HGB DUPL SEE [id number]  15.1 14.8 13.6 13.4 11.3* 11.2*  HCT DUPL SEE [id number]  52.8* 44.4 40.0 37.2* 33.6* 33.4*  MCV DUPL SEE [id number]  99.6 85.4  --  80.3 82.6 83.9  PLT DUPL SEE [id number]  278 264  --  174 181 182   Basic Metabolic Panel:  Recent Labs Lab 10/10/16 1356  10/11/16 1615 10/12/16 0044 10/12/16 0307 10/13/16 0609 10/14/16 0153  NA 152*  < > 153* 146* 149* 147* 144  K 3.5  < > 3.7 3.8 4.1 3.7 3.5  CL 121*  < > 124* 116* 120* 113* 112*  CO2 10*  < > 19* 19* 18* 23 22  GLUCOSE 820*  < > 164* 414* 375* 315* 238*  BUN 50*  < > 34* 32* 28* 16 17  CREATININE 2.65*  < >  2.07* 2.13* 1.94* 1.43* 1.25*  CALCIUM 10.2  < > 9.7 9.1 9.2 9.0 9.2  MG 3.5*  --   --   --  2.1 2.1  --   PHOS 1.4*  --   --   --  2.7 2.3*  --   < > = values in this interval not displayed. GFR: Estimated Creatinine Clearance: 114.6 mL/min (A) (by C-G formula based on SCr of 1.25 mg/dL (H)). Liver Function Tests:  Recent Labs Lab 10/10/16 0719 10/11/16 0344  AST 15  --   ALT 25  --   ALKPHOS 212* 111  BILITOT 1.8* 0.7  PROT 7.5  --   ALBUMIN 4.2  --     Recent Labs Lab 10/10/16 0719 10/11/16 0344 10/11/16 1603 10/12/16 1159 10/13/16 0609  LIPASE 767*  --  1,143* 269* 146*  AMYLASE  --  467*  --   --   --    No results for input(s): AMMONIA in the last 168 hours. Coagulation Profile: No results for input(s): INR, PROTIME in the last 168 hours. Cardiac Enzymes:  Recent Labs Lab 10/10/16 0719 10/11/16 0344  CKTOTAL 455* 569*   BNP (last 3 results) No results for input(s): PROBNP in the last 8760  hours. HbA1C: No results for input(s): HGBA1C in the last 72 hours. CBG:  Recent Labs Lab 10/13/16 1226 10/13/16 1709 10/13/16 2113 10/14/16 0801 10/14/16 1149  GLUCAP 383* 354* 215* 222* 276*   Lipid Profile:  Recent Labs  10/12/16 1159  TRIG 365*   Thyroid Function Tests: No results for input(s): TSH, T4TOTAL, FREET4, T3FREE, THYROIDAB in the last 72 hours. Anemia Panel: No results for input(s): VITAMINB12, FOLATE, FERRITIN, TIBC, IRON, RETICCTPCT in the last 72 hours. Urine analysis:    Component Value Date/Time   COLORURINE YELLOW 10/10/2016 0642   APPEARANCEUR CLEAR 10/10/2016 0642   LABSPEC 1.026 10/10/2016 0642   PHURINE 5.0 10/10/2016 0642   GLUCOSEU >=500 (A) 10/10/2016 0642   HGBUR MODERATE (A) 10/10/2016 0642   BILIRUBINUR NEGATIVE 10/10/2016 0642   KETONESUR 20 (A) 10/10/2016 0642   PROTEINUR 30 (A) 10/10/2016 0642   UROBILINOGEN 0.2 07/29/2014 0602   NITRITE NEGATIVE 10/10/2016 0642   LEUKOCYTESUR NEGATIVE 10/10/2016 0642   Sepsis Labs: @LABRCNTIP (procalcitonin:4,lacticidven:4)  ) Recent Results (from the past 240 hour(s))  Urine culture     Status: Abnormal   Collection Time: 10/10/16  6:42 AM  Result Value Ref Range Status   Specimen Description URINE, CATHETERIZED  Final   Special Requests NONE  Final   Culture <10,000 COLONIES/mL INSIGNIFICANT GROWTH (A)  Final   Report Status 10/11/2016 FINAL  Final  Blood Culture (routine x 2)     Status: None (Preliminary result)   Collection Time: 10/10/16  7:19 AM  Result Value Ref Range Status   Specimen Description BLOOD LEFT HAND  Final   Special Requests IN PEDIATRIC BOTTLE Blood Culture adequate volume  Final   Culture NO GROWTH 3 DAYS  Final   Report Status PENDING  Incomplete  Blood Culture (routine x 2)     Status: None (Preliminary result)   Collection Time: 10/10/16  8:02 AM  Result Value Ref Range Status   Specimen Description BLOOD LEFT ANTECUBITAL  Final   Special Requests IN PEDIATRIC  BOTTLE Blood Culture adequate volume  Final   Culture NO GROWTH 3 DAYS  Final   Report Status PENDING  Incomplete  MRSA PCR Screening     Status: None   Collection Time: 10/10/16  1:45 PM  Result Value Ref Range Status   MRSA by PCR NEGATIVE NEGATIVE Final    Comment:        The GeneXpert MRSA Assay (FDA approved for NASAL specimens only), is one component of a comprehensive MRSA colonization surveillance program. It is not intended to diagnose MRSA infection nor to guide or monitor treatment for MRSA infections.          Radiology Studies: No results found.      Scheduled Meds: . benztropine  0.5 mg Oral BID  . busPIRone  5 mg Oral BID  . heparin subcutaneous  5,000 Units Subcutaneous Q8H  . insulin aspart  0-20 Units Subcutaneous TID WC  . insulin aspart  0-5 Units Subcutaneous QHS  . insulin glargine  30 Units Subcutaneous BID  . risperiDONE  1 mg Oral QHS   Continuous Infusions: . sodium chloride    . sodium chloride 10 mL/hr at 10/12/16 1716     LOS: 4 days    Time spent: 35min    Zannie CovePreetha Parker Sawatzky, MD [facility] Pager [phone]  If 7PM-7AM, please contact night-coverage [email or web address] Password [email or web address] 10/14/2016, 12:51 PM

## 2016-10-14 NOTE — Progress Notes (Signed)
NCM received call from pt's grandmother and confirmed the plan is for pt to d/c to home with her. Address: [address], [place] [id number]. Gae GallopAngela Frederika Hukill RN,BSN,CM

## 2016-10-15 DIAGNOSIS — F411 Generalized anxiety disorder: Secondary | ICD-10-CM

## 2016-10-15 LAB — CULTURE, BLOOD (ROUTINE X 2)
CULTURE: NO GROWTH
CULTURE: NO GROWTH
Special Requests: ADEQUATE
Special Requests: ADEQUATE

## 2016-10-15 LAB — GLUCOSE, CAPILLARY
GLUCOSE-CAPILLARY: 274 mg/dL — AB (ref 65–99)
GLUCOSE-CAPILLARY: 232 mg/dL — AB (ref 65–99)
GLUCOSE-CAPILLARY: 227 mg/dL — AB (ref 65–99)
Glucose-Capillary: 159 mg/dL — ABNORMAL HIGH (ref 65–99)
Glucose-Capillary: 295 mg/dL — ABNORMAL HIGH (ref 65–99)

## 2016-10-15 MED ORDER — INSULIN GLARGINE 100 UNIT/ML ~~LOC~~ SOLN
38.0000 [IU] | Freq: Two times a day (BID) | SUBCUTANEOUS | Status: DC
  Administered 2016-10-15 (×2): 38 [IU] via SUBCUTANEOUS
  Filled 2016-10-15 (×3): qty 0.38

## 2016-10-15 MED ORDER — POLYETHYLENE GLYCOL 3350 17 G PO PACK
17.0000 g | PACK | Freq: Every day | ORAL | Status: DC
  Administered 2016-10-15: 17 g via ORAL
  Filled 2016-10-15: qty 1

## 2016-10-15 MED ORDER — INSULIN ASPART 100 UNIT/ML ~~LOC~~ SOLN: SUBCUTANEOUS | 3 refills | Status: DC

## 2016-10-15 MED ORDER — INSULIN GLARGINE 100 UNIT/ML ~~LOC~~ SOLN: 38.0000 [IU] | Freq: Two times a day (BID) | SUBCUTANEOUS | 11 refills | Status: DC

## 2016-10-15 MED ORDER — POLYETHYLENE GLYCOL 3350 17 G PO PACK: 17.0000 g | PACK | Freq: Every day | ORAL | 0 refills | Status: DC

## 2016-10-15 NOTE — Progress Notes (Signed)
Tried to ask pt.to give Lantus 35 units but pt.is not pushing enough the needle to his abd.& it looks like he is scared to inject it to himself.Needs more supervision to administer his own insulin.

## 2016-10-15 NOTE — Progress Notes (Signed)
Informed earlier today patient would be discharging around 5:30. Around 6:15 I attempted to call grandma x 2. On third attempt Grandma answered and stated that she was being held up at work and may not be able to pick patient up until morning. She will bring pt. Sister also to learning how to stick pt. I expressed some concerns over patient being able to care for self and give daily insulin injections. She stated when at work 3x a week the patient will be responsible for sticking self at least once a day. Even with home health on occasion that may not be enough. I don't feel comfortable with patient giving himself insulin and comparing reading without proper assistance. Especially with such a high A1C and being admitted with DKA. The grandmother also expressed concern on being the primary person responsible for administering insulin to the patient. I asked if she had any other family to help her and she said there was no one else besides the sister but she has a 582 month old child.

## 2016-10-15 NOTE — Discharge Summary (Addendum)
Physician Discharge Summary  Jacob Price MRN: [id number] DOB/AGE: [birth year] 31 y.o.  PCP: Patient, No Pcp Per   Admit date: 10/10/2016 Discharge date: 10/15/2016  Discharge Diagnoses:    Active Problems:   Autism spectrum disorder   Generalized anxiety disorder   Schizoaffective disorder (HCC)   DKA (diabetic ketoacidoses) ([place])    Follow-up recommendations Follow-up with PCP in 3-5 days , including all  additional recommended appointments as below Follow-up CBC, CMP in 3-5 days Patient is being discharged with home health and 24 / 7 caregiver      Current Discharge Medication List    START taking these medications   Details  insulin aspart (NOVOLOG) 100 UNIT/ML injection Correction coverage: Resistant (obese, steroids)  CBG < 70: implement hypoglycemia protocol  CBG 70 - 120: 0 units  CBG 121 - 150: 3 units  CBG 151 - 200: 4 units  CBG 201 - 250: 7 units  CBG 251 - 300: 11 units  CBG 301 - 350: 15 units  CBG 351 - 400: 20 units  CBG > 400 call MD and obtain STAT lab verification Qty: 10 mL, Refills: 3    insulin glargine (LANTUS) 100 UNIT/ML injection Inject 0.42 mLs (42 Units total) into the skin 2 (two) times daily. Qty: 10 mL, Refills: 11    polyethylene glycol (MIRALAX / GLYCOLAX) packet Take 17 g by mouth daily. Qty: 14 each, Refills: 0      CONTINUE these medications which have NOT CHANGED   Details  benztropine (COGENTIN) 0.5 MG tablet Take 0.5 mg by mouth 2 (two) times daily.    busPIRone (BUSPAR) 7.5 MG tablet Take 1 tablet (7.5 mg total) by mouth 3 (three) times daily. Qty: 90 tablet, Refills: 0    risperiDONE (RISPERDAL) 2 MG tablet Take 2 mg by mouth 3 (three) times daily.    risperiDONE microspheres (RISPERDAL CONSTA) 25 MG injection Inject 25 mg into the muscle every 14 (fourteen) days.      STOP taking these medications     sertraline (ZOLOFT) 50 MG tablet      traZODone (DESYREL) 50 MG tablet      triamterene-hydrochlorothiazide  (MAXZIDE-25) 37.5-25 MG per tablet          Discharge Condition: Stable   Discharge Instructions Get Medicines reviewed and adjusted: Please take all your medications with you for your next visit with your Primary MD  Please request your Primary MD to go over all hospital tests and procedure/radiological results at the follow up, please ask your Primary MD to get all Hospital records sent to his/her office.  If you experience worsening of your admission symptoms, develop shortness of breath, life threatening emergency, suicidal or homicidal thoughts you must seek medical attention immediately by calling 911 or calling your MD immediately if symptoms less severe.  You must read complete instructions/literature along with all the possible adverse reactions/side effects for all the Medicines you take and that have been prescribed to you. Take any new Medicines after you have completely understood and accpet all the possible adverse reactions/side effects.   Do not drive when taking Pain medications.   Do not take more than prescribed Pain, Sleep and Anxiety Medications  Special Instructions: If you have smoked or chewed Tobacco in the last 2 yrs please stop smoking, stop any regular Alcohol and or any Recreational drug use.  Wear Seat belts while driving.  Please note  You were cared for by a hospitalist during your hospital stay. Once you are  discharged, your primary care physician will handle any further medical issues. Please note that NO REFILLS for any discharge medications will be authorized once you are discharged, as it is imperative that you return to your primary care physician (or establish a relationship with a primary care physician if you do not have one) for your aftercare needs so that they can reassess your need for medications and monitor your lab values.     No Known Allergies    Disposition: Home with home health   Consults:      Significant Diagnostic  Studies:  Ct Head Wo Contrast  Result Date: 10/10/2016 CLINICAL DATA:  Fatigue and decreased appetite since last night. Altered level of consciousness. Patient found down today. EXAM: CT HEAD WITHOUT CONTRAST CT CERVICAL SPINE WITHOUT CONTRAST TECHNIQUE: Multidetector CT imaging of the head and cervical spine was performed following the standard protocol without intravenous contrast. Multiplanar CT image reconstructions of the cervical spine were also generated. COMPARISON:  None. FINDINGS: CT HEAD FINDINGS Brain: Appears normal without hemorrhage, infarct, mass lesion, mass effect, midline shift or abnormal extra-axial fluid collection. No hydrocephalus or pneumocephalus. Vascular: Negative. Skull: Intact. Sinuses/Orbits: Negative. Other: None. CT CERVICAL SPINE FINDINGS Alignment: Normal. Skull base and vertebrae: No acute fracture. No primary bone lesion or focal pathologic process. Soft tissues and spinal canal: No prevertebral fluid or swelling. No visible canal hematoma. Disc levels: Intervertebral disc space height is maintained. Mild endplate spurring H8-2 noted. Upper chest: Lung apices clear. Other: None. IMPRESSION: Negative head and cervical spine CT scans. Electronically Signed   By: Inge Rise M.D.   On: 10/10/2016 10:59   Ct Cervical Spine Wo Contrast  Result Date: 10/10/2016 CLINICAL DATA:  Fatigue and decreased appetite since last night. Altered level of consciousness. Patient found down today. EXAM: CT HEAD WITHOUT CONTRAST CT CERVICAL SPINE WITHOUT CONTRAST TECHNIQUE: Multidetector CT imaging of the head and cervical spine was performed following the standard protocol without intravenous contrast. Multiplanar CT image reconstructions of the cervical spine were also generated. COMPARISON:  None. FINDINGS: CT HEAD FINDINGS Brain: Appears normal without hemorrhage, infarct, mass lesion, mass effect, midline shift or abnormal extra-axial fluid collection. No hydrocephalus or  pneumocephalus. Vascular: Negative. Skull: Intact. Sinuses/Orbits: Negative. Other: None. CT CERVICAL SPINE FINDINGS Alignment: Normal. Skull base and vertebrae: No acute fracture. No primary bone lesion or focal pathologic process. Soft tissues and spinal canal: No prevertebral fluid or swelling. No visible canal hematoma. Disc levels: Intervertebral disc space height is maintained. Mild endplate spurring X9-3 noted. Upper chest: Lung apices clear. Other: None. IMPRESSION: Negative head and cervical spine CT scans. Electronically Signed   By: Inge Rise M.D.   On: 10/10/2016 10:59   US Abdomen Complete  Result Date: 10/10/2016 CLINICAL DATA:  Elevated lipase.  Hypertension. EXAM: ABDOMEN ULTRASOUND COMPLETE COMPARISON:  None. FINDINGS: Gallbladder: No gallstones or wall thickening visualized. No sonographic Murphy sign noted by sonographer. Common bile duct: Diameter: 2.4 mm Liver: No focal lesion identified. Echogenic liver parenchyma without biliary dilatation or focal mass. IVC: No abnormality visualized. Pancreas: Visualized portion unremarkable. Spleen: Size and appearance within normal limits. Right Kidney: Length: 12.6 cm. Echogenicity within normal limits. No mass or hydronephrosis visualized. Left Kidney: Length: 11.6 cm. Echogenicity within normal limits. No mass or hydronephrosis visualized. Abdominal aorta: No aneurysm visualized. The distal aorta was not well visualized. Maximum caliber of the aorta was 2.4 cm. Other findings: None. IMPRESSION: Echogenic liver consistent fatty infiltration. Otherwise negative exam. Electronically Signed   By:  Ashley Royalty M.D.   On: 10/10/2016 17:56   Dg Chest Port 1 View  Result Date: 10/11/2016 CLINICAL DATA:  Respiratory failure EXAM: PORTABLE CHEST 1 VIEW COMPARISON:  10/10/2016 FINDINGS: Low lung volumes with bibasilar atelectasis unchanged. Negative for heart failure or effusion. Heart size normal. IMPRESSION: Bibasilar atelectasis unchanged.  Electronically Signed   By: Franchot Gallo M.D.   On: 10/11/2016 09:03   Dg Chest Port 1 View  Result Date: 10/10/2016 CLINICAL DATA:  Fatigue and decreased appetite, found lying on the floor. EXAM: PORTABLE CHEST 1 VIEW COMPARISON:  None. FINDINGS: Trachea is midline. Heart size within normal limits. Lungs are low in volume. No airspace consolidation or pleural fluid. IMPRESSION: Low lung volumes.  No acute findings. Electronically Signed   By: Lorin Picket M.D.   On: 10/10/2016 07:53        Filed Weights   10/10/16 1400  Weight: 127 kg (279 lb 15.8 oz)     Microbiology: Recent Results (from the past 240 hour(s))  Urine culture     Status: Abnormal   Collection Time: 10/10/16  6:42 AM  Result Value Ref Range Status   Specimen Description URINE, CATHETERIZED  Final   Special Requests NONE  Final   Culture <10,000 COLONIES/mL INSIGNIFICANT GROWTH (A)  Final   Report Status 10/11/2016 FINAL  Final  Blood Culture (routine x 2)     Status: None (Preliminary result)   Collection Time: 10/10/16  7:19 AM  Result Value Ref Range Status   Specimen Description BLOOD LEFT HAND  Final   Special Requests IN PEDIATRIC BOTTLE Blood Culture adequate volume  Final   Culture NO GROWTH 4 DAYS  Final   Report Status PENDING  Incomplete  Blood Culture (routine x 2)     Status: None (Preliminary result)   Collection Time: 10/10/16  8:02 AM  Result Value Ref Range Status   Specimen Description BLOOD LEFT ANTECUBITAL  Final   Special Requests IN PEDIATRIC BOTTLE Blood Culture adequate volume  Final   Culture NO GROWTH 4 DAYS  Final   Report Status PENDING  Incomplete  MRSA PCR Screening     Status: None   Collection Time: 10/10/16  1:45 PM  Result Value Ref Range Status   MRSA by PCR NEGATIVE NEGATIVE Final    Comment:        The GeneXpert MRSA Assay (FDA approved for NASAL specimens only), is one component of a comprehensive MRSA colonization surveillance program. It is not intended to  diagnose MRSA infection nor to guide or monitor treatment for MRSA infections.        Blood Culture    Component Value Date/Time   SDES BLOOD LEFT ANTECUBITAL 10/10/2016 0802   SPECREQUEST IN PEDIATRIC BOTTLE Blood Culture adequate volume 10/10/2016 0802   CULT NO GROWTH 4 DAYS 10/10/2016 0802   REPTSTATUS PENDING 10/10/2016 0802      Labs: Results for orders placed or performed during the hospital encounter of 10/10/16 (from the past 48 hour(s))  Glucose, capillary     Status: Abnormal   Collection Time: 10/13/16 12:26 PM  Result Value Ref Range   Glucose-Capillary 383 (H) 65 - 99 mg/dL  Glucose, capillary     Status: Abnormal   Collection Time: 10/13/16  5:09 PM  Result Value Ref Range   Glucose-Capillary 354 (H) 65 - 99 mg/dL  Glucose, capillary     Status: Abnormal   Collection Time: 10/13/16  9:13 PM  Result Value Ref Range  Glucose-Capillary 215 (H) 65 - 99 mg/dL   Comment 1 Notify RN   Basic metabolic panel     Status: Abnormal   Collection Time: 10/14/16  1:53 AM  Result Value Ref Range   Sodium 144 135 - 145 mmol/L   Potassium 3.5 3.5 - 5.1 mmol/L   Chloride 112 (H) 101 - 111 mmol/L   CO2 22 22 - 32 mmol/L   Glucose, Bld 238 (H) 65 - 99 mg/dL   BUN 17 6 - 20 mg/dL   Creatinine, Ser 1.25 (H) 0.61 - 1.24 mg/dL   Calcium 9.2 8.9 - 10.3 mg/dL   GFR calc non Af Amer >60 >60 mL/min   GFR calc Af Amer >60 >60 mL/min    Comment: (NOTE) The eGFR has been calculated using the CKD EPI equation. This calculation has not been validated in all clinical situations. eGFR's persistently <60 mL/min signify possible Chronic Kidney Disease.    Anion gap 10 5 - 15  CBC     Status: Abnormal   Collection Time: 10/14/16  6:37 AM  Result Value Ref Range   WBC 7.1 4.0 - 10.5 K/uL   RBC 3.98 (L) 4.22 - 5.81 MIL/uL   Hemoglobin 11.2 (L) 13.0 - 17.0 g/dL   HCT 33.4 (L) 39.0 - 52.0 %   MCV 83.9 78.0 - 100.0 fL   MCH 28.1 26.0 - 34.0 pg   MCHC 33.5 30.0 - 36.0 g/dL   RDW  14.1 11.5 - 15.5 %   Platelets 182 150 - 400 K/uL  Glucose, capillary     Status: Abnormal   Collection Time: 10/14/16  8:01 AM  Result Value Ref Range   Glucose-Capillary 222 (H) 65 - 99 mg/dL  Glucose, capillary     Status: Abnormal   Collection Time: 10/14/16 11:49 AM  Result Value Ref Range   Glucose-Capillary 276 (H) 65 - 99 mg/dL  Glucose, capillary     Status: Abnormal   Collection Time: 10/14/16  4:48 PM  Result Value Ref Range   Glucose-Capillary 194 (H) 65 - 99 mg/dL  Glucose, capillary     Status: Abnormal   Collection Time: 10/14/16  9:50 PM  Result Value Ref Range   Glucose-Capillary 200 (H) 65 - 99 mg/dL   Comment 1 Notify RN      Lipid Panel     Component Value Date/Time   TRIG 365 (H) 10/12/2016 1159        HPI :  31 year old male with PMH significant for untreated HTN and schizophrenia/? Autism. He is followed by [facility], where he receives IM injections of Respidol Consta once every 2 weeks.. Pt. Presents to the ED 10/10/16 after having been found down by mother with AMS& new onset DKA with serum glucose of 1651.  HOSPITAL COURSE:   DKA, new onset diabetes Admitted to ICU and started on insulin drip, DKA protocol -Subsequently started on Lantus and SSI -Currently on Lantus 42 units twice a day with sliding scale insulin -Hba1c 15.5 -Unfortunately really needs Insulin, but with Autism and mental health issues unable to safely manage this by himself and recognize symptoms of hypoglycemia -discussed with CM and CSW,  patient is being discharged with home health and 24/ 7 care by family     Schizoaffective disorder (HCC)/  Autism spectrum disorder/ Generalized anxiety disorder -Continue outpatient psych meds -continue cogentin -stable   Hypertension Outpatient medication discontinued due to hypotension, AKI, hyperkalemia, upon admission  Elevated lipase Initially suspicion  for acute pancreatitis, lipase is now down trending No  abdominal symptoms   Discharge Exam: *  Blood pressure 134/80, pulse (!) 107, temperature 99.4 F (37.4 C), resp. rate 16, height '5\' 10"'  (1.778 m), weight 127 kg (279 lb 15.8 oz), SpO2 98 %.  General:  Arousable male, supine in bed, slow to respond( ? Baseline) Neuro:  MAE x 4, A&O x 3, PERRLA, non focal, flat affect HEENT: normocephalic, atraumatic, -JVD, no LAD, MM dry Cardiovascular:  Tachy per monitor, S1, S2, RRR, No RMG Lungs:  Clear throughout, respirations regular, fruity breath Abdomen:  Non-tender, non-distended, BS +    Follow-up Information    [facility] Follow [facility].   Why:  Home Health RN Contact information: [address] [place] [address] [phone]        [place]. Go on 11/03/2016.   Why:  Hospital post follow up appointment scheduled for July 9th, 2018 at 8:45 am with Domenica Fail PA Contact information: [place] [address] [phone]          Signed: Reyne Price 10/15/2016, 8:14 AM        Time spent >1 hour

## 2016-10-15 NOTE — Progress Notes (Addendum)
CM received consult: Please identified, caregiver at home.    Elder LoveWrienna Mckeone # [phone]. Pt's grandmother will be caregiver once d/c to home. Gae GallopAngela Shantaya Bluestone RN,BSN,CM

## 2016-10-16 DIAGNOSIS — F840 Autistic disorder: Secondary | ICD-10-CM

## 2016-10-16 DIAGNOSIS — J9690 Respiratory failure, unspecified, unspecified whether with hypoxia or hypercapnia: Secondary | ICD-10-CM

## 2016-10-16 DIAGNOSIS — J9601 Acute respiratory failure with hypoxia: Secondary | ICD-10-CM

## 2016-10-16 LAB — GLUCOSE, CAPILLARY
GLUCOSE-CAPILLARY: 149 mg/dL — AB (ref 65–99)
Glucose-Capillary: 246 mg/dL — ABNORMAL HIGH (ref 65–99)
Glucose-Capillary: 249 mg/dL — ABNORMAL HIGH (ref 65–99)

## 2016-10-16 MED ORDER — INSULIN PEN NEEDLE 29G X 12.7MM MISC: 11 refills | Status: DC

## 2016-10-16 MED ORDER — FREESTYLE SYSTEM KIT: 1.0000 | PACK | 1 refills | Status: DC | PRN

## 2016-10-16 MED ORDER — FREESTYLE LANCETS MISC: 5 refills | Status: DC

## 2016-10-16 MED ORDER — INSULIN GLARGINE 100 UNIT/ML ~~LOC~~ SOLN: 42.0000 [IU] | Freq: Two times a day (BID) | SUBCUTANEOUS | 11 refills | Status: DC

## 2016-10-16 MED ORDER — INSULIN GLARGINE 100 UNIT/ML ~~LOC~~ SOLN
42.0000 [IU] | Freq: Two times a day (BID) | SUBCUTANEOUS | Status: DC
  Filled 2016-10-16: qty 0.42

## 2016-10-16 MED ORDER — GLUCOSE BLOOD VI STRP: ORAL_STRIP | 12 refills | Status: DC

## 2016-10-16 MED ORDER — INSULIN GLARGINE 100 UNIT/ML ~~LOC~~ SOLN: 42.0000 [IU] | Freq: Two times a day (BID) | SUBCUTANEOUS | Status: DC

## 2016-10-16 NOTE — Progress Notes (Signed)
Patient discharge teaching given, including activity, diet, follow-up appoints, and medications. Patient verbalized understanding of all discharge instructions. IV access was d/c'd. Vitals are stable. Skin is intact except as charted in most recent assessments. Pt to be escorted out by NT, to be driven home by family. 

## 2016-10-16 NOTE — Progress Notes (Signed)
Patient unable to be picked up by grandmother yesterday and therefore is being discharged today Medically stable Home health RN to monitor insulin administration along with home health aide to assist

## 2016-10-17 MED ORDER — GLUCOSE BLOOD VI STRP: ORAL_STRIP | 12 refills | Status: DC

## 2016-10-17 MED ORDER — FREESTYLE SYSTEM KIT: 1.0000 | PACK | 1 refills | Status: DC | PRN

## 2016-10-21 ENCOUNTER — Telehealth: Payer: Self-pay | Admitting: *Deleted

## 2016-11-03 ENCOUNTER — Ambulatory Visit (INDEPENDENT_AMBULATORY_CARE_PROVIDER_SITE_OTHER): Payer: Medicaid Other | Admitting: Physician Assistant

## 2016-11-03 ENCOUNTER — Encounter (INDEPENDENT_AMBULATORY_CARE_PROVIDER_SITE_OTHER): Payer: Self-pay | Admitting: Physician Assistant

## 2016-11-03 VITALS — BP 158/87 | HR 101 | Temp 98.2°F | Ht 70.47 in | Wt 246.6 lb

## 2016-11-03 DIAGNOSIS — E1165 Type 2 diabetes mellitus with hyperglycemia: Secondary | ICD-10-CM | POA: Diagnosis not present

## 2016-11-03 DIAGNOSIS — R5383 Other fatigue: Secondary | ICD-10-CM

## 2016-11-03 DIAGNOSIS — I10 Essential (primary) hypertension: Secondary | ICD-10-CM

## 2016-11-03 DIAGNOSIS — L819 Disorder of pigmentation, unspecified: Secondary | ICD-10-CM

## 2016-11-03 MED ORDER — INSULIN ASPART 100 UNIT/ML FLEXPEN: PEN_INJECTOR | SUBCUTANEOUS | 11 refills | Status: DC

## 2016-11-03 MED ORDER — INSULIN GLARGINE 100 UNIT/ML SOLOSTAR PEN: 39.0000 [IU] | PEN_INJECTOR | Freq: Two times a day (BID) | SUBCUTANEOUS | 11 refills | Status: DC

## 2016-11-03 MED ORDER — VITAMIN D 50 MCG (2000 UT) PO TABS: 2000.0000 [IU] | ORAL_TABLET | Freq: Every day | ORAL | 1 refills | Status: DC

## 2016-11-03 MED ORDER — HYDROCHLOROTHIAZIDE 25 MG PO TABS: 25.0000 mg | ORAL_TABLET | Freq: Every day | ORAL | 1 refills | Status: DC

## 2016-11-03 MED ORDER — INSULIN PEN NEEDLE 29G X 12.7MM MISC: 11 refills | Status: DC

## 2016-11-03 NOTE — Patient Instructions (Signed)

## 2016-11-03 NOTE — Progress Notes (Signed)
Subjective:  Patient ID: Jacob Price, male    DOB: 1985/10/29  Age: 31 y.o. MRN: [id number]  CC: f/u hospital   HPI Kamir Selover is a 31 y.o. male with a PMH of HTN, autism spectrum disorder, GAD, schizoaffective disorder presents for hospital admission from 10/10/16 to 10/16/16. A1c 15.5% in hospital. Pt reports that nurses have come to his home and his grandmother helps him inject insulin. Currently taking 38 units of Lantus BID and also using Novolog sliding scale. Current glucometer reading for July range from 80 to 160s. Pt states he feels "fair". Home health nurse has been to pt's home only twice since he left the hospital. She is approved by Medicaid to go to pt's house once a week. Nurse does not check pt's blood glucose according to patient. Grandmother is present during encounter and is the main communicator. She is taking care of  as best as she can. She still works and is unable to care for patient full time. Endorses polydipsia, polyuria, and fatigue.  Denies polyphagia, visual blurring, tingling, or numbness.    He was found to be hypotensive during his hospital stay and was taken off his anti-hypertensives. Has not taken anti-hypertensives since discharge. BP today 158/87.  Denies CP, palpitations, SOB, HA, abdominal pain.    Managed by [facility] for psychiatric conditions with good control of schizoaffective disorder and GAD.     Grandmother has noticed scaly hyperpigmentation on LE bilaterally. Hyperpigmentation on UE bilaterally without scaling. Patient does not remember how long he has had hyperpigmentation. Not pruritic or painful.    Outpatient Medications Prior to Visit  Medication Sig Dispense Refill  . glucose blood (IGLUCOSE TEST STRIPS) test strip Check CBG TID 100 each 12  . glucose monitoring kit (FREESTYLE) monitoring kit 1 each by Does not apply route as needed for other. Check CBG TID 1 each 1  . Lancets (FREESTYLE) lancets CHECK CBG TID 100 each 5  . insulin aspart  (NOVOLOG) 100 UNIT/ML injection Correction coverage: Resistant (obese, steroids)  CBG < 70: implement hypoglycemia protocol  CBG 70 - 120: 0 units  CBG 121 - 150: 3 units  CBG 151 - 200: 4 units  CBG 201 - 250: 7 units  CBG 251 - 300: 11 units  CBG 301 - 350: 15 units  CBG 351 - 400: 20 units  CBG > 400 call MD and obtain STAT lab verification 10 mL 3  . insulin glargine (LANTUS) 100 UNIT/ML injection Inject 0.42 mLs (42 Units total) into the skin 2 (two) times daily. 10 mL 11  . Insulin Pen Needle 29G X 12.7MM MISC Insulin administration 5  times daily 150 each 11  . benztropine (COGENTIN) 0.5 MG tablet Take 0.5 mg by mouth 2 (two) times daily.    . busPIRone (BUSPAR) 7.5 MG tablet Take 1 tablet (7.5 mg total) by mouth 3 (three) times daily. (Patient not taking: Reported on 11/03/2016) 90 tablet 0  . polyethylene glycol (MIRALAX / GLYCOLAX) packet Take 17 g by mouth daily. 14 each 0  . risperiDONE (RISPERDAL) 2 MG tablet Take 2 mg by mouth 3 (three) times daily.    . risperiDONE microspheres (RISPERDAL CONSTA) 25 MG injection Inject 25 mg into the muscle every 14 (fourteen) days.     No facility-administered medications prior to visit.      ROS Review of Systems  Constitutional: Positive for malaise/fatigue. Negative for chills and fever.  Eyes: Negative for blurred vision.  Respiratory: Negative for shortness of breath.  Cardiovascular: Negative for chest pain and palpitations.  Gastrointestinal: Negative for abdominal pain and nausea.  Genitourinary: Negative for dysuria and hematuria.  Musculoskeletal: Negative for joint pain and myalgias.  Skin: Negative for rash.  Neurological: Negative for tingling and headaches.  Endo/Heme/Allergies: Positive for polydipsia.  Psychiatric/Behavioral: Negative for depression. The patient is not nervous/anxious.     Objective:  BP (!) 158/87 (BP Location: Left Arm, Patient Position: Sitting, Cuff Size: Large)   Pulse (!) 101   Temp 98.2 F  (36.8 C) (Oral)   Ht 5' 10.47" (1.79 m)   Wt 246 lb 9.6 oz (111.9 kg)   SpO2 94%   BMI 34.91 kg/m   BP/Weight 11/03/2016 10/16/2016 1/70/0174  Systolic BP 944 967 -  Diastolic BP 87 76 -  Wt. (Lbs) 246.6 - 279.98  BMI 34.91 - 40.17  Some encounter information is confidential and restricted. Go to Review Flowsheets activity to see all data.      Physical Exam  Constitutional: He is oriented to person, place, and time.  Well developed, overweight, NAD, polite  HENT:  Head: Normocephalic and atraumatic.  Eyes: No scleral icterus.  Neck: Normal range of motion. Neck supple. No thyromegaly present.  Cardiovascular: Normal rate, regular rhythm and normal heart sounds.   Pulmonary/Chest: Effort normal and breath sounds normal.  Abdominal: Soft. Bowel sounds are normal. There is no tenderness.  Musculoskeletal: He exhibits no edema.  Neurological: He is alert and oriented to person, place, and time. No cranial nerve deficit. Coordination normal.  Skin: Skin is warm and dry. No rash noted. No erythema. No pallor.  Dorsal aspect of UE bilaterally with uniform hyperpigmentation, no associated lesions, scaling, crusting. Anterior aspect of distal LE bilaterally with hyperpigmentation and scaling resembling ichthyosis.   Psychiatric: He has a normal mood and affect. Thought content normal.  Averts gaze, echoes words at end of sentence at times.  Vitals reviewed.    Assessment & Plan:   1. Type 2 diabetes mellitus with hyperglycemia, without long-term current use of insulin (HCC) - TSH - CBC with Differential - Comprehensive metabolic panel - Begin Insulin Glargine (LANTUS SOLOSTAR) 100 UNIT/ML Solostar Pen; Inject 39 Units into the skin 2 (two) times daily.  Dispense: 6 pen; Refill: 11 - Begin insulin aspart (NOVOLOG FLEXPEN) 100 UNIT/ML FlexPen; Use as sliding scale.  Dispense: 15 mL; Refill: 11 - Refill Insulin Pen Needle 29G X 12.7MM MISC; Insulin administration 5  times daily   Dispense: 150 each; Refill: 11  2. Hypertension, unspecified type - Begin hydrochlorothiazide (HYDRODIURIL) 25 MG tablet; Take 1 tablet (25 mg total) by mouth daily.  Dispense: 90 tablet; Refill: 1  3. Fatigue, unspecified type - Begin Cholecalciferol (VITAMIN D) 2000 units tablet; Take 1 tablet (2,000 Units total) by mouth daily.  Dispense: 90 tablet; Refill: 1  4. Hyperpigmentation - Unknown etiology at this point. Will need to review labs and make referral to dermatology - Ambulatory referral to dermatology  Meds ordered this encounter  Medications  . Cholecalciferol (VITAMIN D) 2000 units tablet    Sig: Take 1 tablet (2,000 Units total) by mouth daily.    Dispense:  90 tablet    Refill:  1    Order Specific Question:   Supervising Provider    Answer:   Tresa Garter [id number]  . Insulin Glargine (LANTUS SOLOSTAR) 100 UNIT/ML Solostar Pen    Sig: Inject 39 Units into the skin 2 (two) times daily.    Dispense:  6 pen  Refill:  11    Order Specific Question:   Supervising Provider    Answer:   Tresa Garter [id number]  . insulin aspart (NOVOLOG FLEXPEN) 100 UNIT/ML FlexPen    Sig: Use as sliding scale.    Dispense:  15 mL    Refill:  11    Order Specific Question:   Supervising Provider    Answer:   Tresa Garter [id number]  . hydrochlorothiazide (HYDRODIURIL) 25 MG tablet    Sig: Take 1 tablet (25 mg total) by mouth daily.    Dispense:  90 tablet    Refill:  1    Order Specific Question:   Supervising Provider    Answer:   Tresa Garter [id number]  . Insulin Pen Needle 29G X 12.7MM MISC    Sig: Insulin administration 5  times daily    Dispense:  150 each    Refill:  11    Order Specific Question:   Supervising Provider    Answer:   Tresa Garter [[id number]]    Follow-up: Return in about 4 weeks (around 12/01/2016) for dm and HTN.   Clent Demark PA

## 2016-11-04 LAB — COMPREHENSIVE METABOLIC PANEL
A/G RATIO: 1.4 (ref 1.2–2.2)
ALBUMIN: 4.1 g/dL (ref 3.5–5.5)
ALT: 28 IU/L (ref 0–44)
AST: 22 IU/L (ref 0–40)
Alkaline Phosphatase: 90 IU/L (ref 39–117)
BILIRUBIN TOTAL: 0.3 mg/dL (ref 0.0–1.2)
BUN / CREAT RATIO: 14 (ref 9–20)
BUN: 13 mg/dL (ref 6–20)
CHLORIDE: 102 mmol/L (ref 96–106)
CO2: 19 mmol/L — ABNORMAL LOW (ref 20–29)
Calcium: 9.7 mg/dL (ref 8.7–10.2)
Creatinine, Ser: 0.94 mg/dL (ref 0.76–1.27)
GFR calc non Af Amer: 108 mL/min/{1.73_m2} (ref >59)
GFR, EST AFRICAN AMERICAN: 124 mL/min/{1.73_m2} (ref >59)
GLOBULIN, TOTAL: 2.9 g/dL (ref 1.5–4.5)
GLUCOSE: 143 mg/dL — AB (ref 65–99)
Potassium: 3.9 mmol/L (ref 3.5–5.2)
SODIUM: 140 mmol/L (ref 134–144)
TOTAL PROTEIN: 7 g/dL (ref 6.0–8.5)

## 2016-11-04 LAB — CBC WITH DIFFERENTIAL/PLATELET
BASOS: 0 %
Basophils Absolute: 0 10*3/uL (ref 0.0–0.2)
EOS (ABSOLUTE): 0 10*3/uL (ref 0.0–0.4)
EOS: 1 %
HEMATOCRIT: 36.9 % — AB (ref 37.5–51.0)
HEMOGLOBIN: 12.5 g/dL — AB (ref 13.0–17.7)
Immature Grans (Abs): 0 10*3/uL (ref 0.0–0.1)
Immature Granulocytes: 0 %
LYMPHS ABS: 1.9 10*3/uL (ref 0.7–3.1)
Lymphs: 33 %
MCH: 29 pg (ref 26.6–33.0)
MCHC: 33.9 g/dL (ref 31.5–35.7)
MCV: 86 fL (ref 79–97)
Monocytes Absolute: 0.4 10*3/uL (ref 0.1–0.9)
Monocytes: 6 %
NEUTROS ABS: 3.4 10*3/uL (ref 1.4–7.0)
Neutrophils: 60 %
Platelets: 380 10*3/uL — ABNORMAL HIGH (ref 150–379)
RBC: 4.31 x10E6/uL (ref 4.14–5.80)
RDW: 15.8 % — ABNORMAL HIGH (ref 12.3–15.4)
WBC: 5.7 10*3/uL (ref 3.4–10.8)

## 2016-11-04 LAB — TSH: TSH: 1.07 u[IU]/mL (ref 0.450–4.500)

## 2016-11-05 ENCOUNTER — Telehealth (INDEPENDENT_AMBULATORY_CARE_PROVIDER_SITE_OTHER): Payer: Self-pay

## 2016-11-05 ENCOUNTER — Other Ambulatory Visit (INDEPENDENT_AMBULATORY_CARE_PROVIDER_SITE_OTHER): Payer: Self-pay | Admitting: Physician Assistant

## 2016-11-05 ENCOUNTER — Telehealth (INDEPENDENT_AMBULATORY_CARE_PROVIDER_SITE_OTHER): Payer: Self-pay | Admitting: Physician Assistant

## 2016-11-05 DIAGNOSIS — D649 Anemia, unspecified: Secondary | ICD-10-CM

## 2016-11-05 MED ORDER — FERROUS SULFATE 325 (65 FE) MG PO TABS: 325.0000 mg | ORAL_TABLET | Freq: Every day | ORAL | 1 refills | Status: DC

## 2016-11-05 NOTE — Telephone Encounter (Signed)
Patient given the results and informed about the medication to pick up and begin taking. Patient expressed understanding. Maryjean Mornempestt S Roberts, CMA

## 2016-11-05 NOTE — Telephone Encounter (Signed)
Dorothy Higher education careers adviserClinical Coordinator from [employer] [facility] called stating she will be following up with patient for a home visit.

## 2016-11-05 NOTE — Telephone Encounter (Signed)
-----   Message from Loletta Specteroger David Gomez, PA-C sent at 11/05/2016  9:16 AM EDT ----- Thyroid normal. Mildly anemic, I will send out iron pills. Liver and kidney results normal.

## 2016-11-07 ENCOUNTER — Telehealth (INDEPENDENT_AMBULATORY_CARE_PROVIDER_SITE_OTHER): Payer: Self-pay | Admitting: Physician Assistant

## 2016-11-07 NOTE — Telephone Encounter (Signed)
Spoke with PCC, [facility], and has to make his primary care provider aware of any concerns she has. Maryjean Mornempestt S Roberts, CMA

## 2016-11-07 NOTE — Telephone Encounter (Signed)
FWD to PCP. Tempestt S Roberts, CMA  

## 2016-11-07 NOTE — Telephone Encounter (Signed)
Mrs York RamDorthey Timmons clinical care coordator ph: [phone] called stated did a home visit with patient Stated is concerned because patient on 2 insulins and requires blood sugars to bechecked 3 to 4 times a day.  Patient glucometer only shows reading for am. Lowest sugar reading in am is 111 Highest sugar reading in am is 157  Did notice patient did not have Resperdal Contra Injection. Takes 1 injection every 14 days.  [facility] and last filled was 09-29-2016 quantity  for 30 days.  Was not able to do a PHQ on patient due to patient very disorganized in his thoughts. Did ask suicidal question patient denied any suicidal thoughts. Also could not find a PHQ on patients last visit on [facility]   Patient does have a home health nurse going to his home but only checks blood pressure.  Patient requires 24 hour care/supervsion due to his risk. But patient is not get 24 hour care from what Mrs Burnadette Popimmons noticed.  Patient's father left while Mrs Burnadette Popimmons was there during the visit. But patient was calm. When she left patients father had not returned home. And Patient grandmother was not home either.  Please follow up with Timmons doesn't want him to fall through the cracks patient is at risk,  Would like collaboration with health care.

## 2016-11-07 NOTE — Telephone Encounter (Signed)
Sounds like patient should have a follow up with his psychiatrist. Based on what I read, patient's glucose seems to be well controlled. However, if patient ran out of his risperdal Contra injection, then he will likely need to go to his [facility]. He will have to go to ED if unable to get injection at psychiatrist. Patient will then have to make a f/u appointment here soon to discuss his home care. Please call pt, his grandfather/grandmother, and also Mrs. Dorthey Timmons with the above information.

## 2016-11-14 ENCOUNTER — Telehealth (INDEPENDENT_AMBULATORY_CARE_PROVIDER_SITE_OTHER): Payer: Self-pay | Admitting: Physician Assistant

## 2016-11-14 NOTE — Telephone Encounter (Signed)
Mrs York RamDorthey Timmons called back stated just wants to let Sindy Messingoger Gomez PA know patient [facility], will have issues [facility] and is also working with patient to go to court to get custody , after gains full guardianship will look for group home for patient.  Patient requires care.  Please follow up with Mrs. Burnadette Popimmons

## 2016-11-17 NOTE — Telephone Encounter (Signed)
Ok, thank you for the update.

## 2016-11-17 NOTE — Telephone Encounter (Signed)
FWD to PCP. Tempestt S Roberts, CMA  

## 2016-11-20 ENCOUNTER — Telehealth (INDEPENDENT_AMBULATORY_CARE_PROVIDER_SITE_OTHER): Payer: Self-pay | Admitting: Physician Assistant

## 2016-11-20 ENCOUNTER — Other Ambulatory Visit (INDEPENDENT_AMBULATORY_CARE_PROVIDER_SITE_OTHER): Payer: Self-pay | Admitting: Physician Assistant

## 2016-11-20 DIAGNOSIS — L602 Onychogryphosis: Secondary | ICD-10-CM

## 2016-11-20 NOTE — Telephone Encounter (Signed)
Referral made, hopefully he can be sent to [facility].

## 2016-11-20 NOTE — Telephone Encounter (Signed)
Mrs Burnadette Popimmons called left voicemail stated visited patient today, and patient has long toe nails, has no signs of infection but does need attention  Request referral to be sent to [facility].  Mrs Burnadette Popimmons did not leave a phone number to call her back.

## 2016-11-20 NOTE — Telephone Encounter (Signed)
FWD to PCP. Jacob Price S Rashan Patient, CMA  

## 2016-11-27 ENCOUNTER — Encounter (INDEPENDENT_AMBULATORY_CARE_PROVIDER_SITE_OTHER): Payer: Self-pay | Admitting: Physician Assistant

## 2016-11-27 ENCOUNTER — Ambulatory Visit (INDEPENDENT_AMBULATORY_CARE_PROVIDER_SITE_OTHER): Payer: Medicaid Other | Admitting: Physician Assistant

## 2016-11-27 VITALS — BP 120/84 | HR 98 | Temp 98.3°F | Wt 246.8 lb

## 2016-11-27 DIAGNOSIS — E119 Type 2 diabetes mellitus without complications: Secondary | ICD-10-CM

## 2016-11-27 DIAGNOSIS — L602 Onychogryphosis: Secondary | ICD-10-CM

## 2016-11-27 DIAGNOSIS — Z794 Long term (current) use of insulin: Secondary | ICD-10-CM

## 2016-11-27 MED ORDER — METFORMIN HCL ER 500 MG PO TB24: 500.0000 mg | ORAL_TABLET | Freq: Every day | ORAL | 3 refills | Status: DC

## 2016-11-27 NOTE — Progress Notes (Signed)
Subjective:  Patient ID: Jacob Price, male    DOB: 1985-09-06  Age: 31 y.o. MRN: [id number]  CC: f/u DM  HPI  [name] is a 31 y.o. male with a PMH of HTN, autism spectrum disorder, GAD, schizoaffective disorder presents for f/u of DM2. A1c 15.5% on 10/10/16. He was seen here on 11/03/16 and instructed to begin Lantus Solostar 39 units BID and Novolog Flexpen sliding scale. Pt reports blood sugar readings between 100 and 200. Endorses polyuria. Does not endorse polyphagia, polydipsia, visual blurring, tingling, numbness, and fatigue. Denies CP, palpitations, SOB, HA, abdominal pain, f/c/n/v, rash, or GI/GU sxs.     Outpatient Medications Prior to Visit  Medication Sig Dispense Refill  . benztropine (COGENTIN) 0.5 MG tablet Take 0.5 mg by mouth 2 (two) times daily.    . busPIRone (BUSPAR) 7.5 MG tablet Take 1 tablet (7.5 mg total) by mouth 3 (three) times daily. (Patient not taking: Reported on 11/03/2016) 90 tablet 0  . Cholecalciferol (VITAMIN D) 2000 units tablet Take 1 tablet (2,000 Units total) by mouth daily. 90 tablet 1  . ferrous sulfate 325 (65 FE) MG tablet Take 1 tablet (325 mg total) by mouth daily with breakfast. 30 tablet 1  . glucose blood (IGLUCOSE TEST STRIPS) test strip Check CBG TID 100 each 12  . glucose monitoring kit (FREESTYLE) monitoring kit 1 each by Does not apply route as needed for other. Check CBG TID 1 each 1  . hydrochlorothiazide (HYDRODIURIL) 25 MG tablet Take 1 tablet (25 mg total) by mouth daily. 90 tablet 1  . insulin aspart (NOVOLOG FLEXPEN) 100 UNIT/ML FlexPen Use as sliding scale. 15 mL 11  . Insulin Glargine (LANTUS SOLOSTAR) 100 UNIT/ML Solostar Pen Inject 39 Units into the skin 2 (two) times daily. 6 pen 11  . Insulin Pen Needle 29G X 12.7MM MISC Insulin administration 5  times daily 150 each 11  . Lancets (FREESTYLE) lancets CHECK CBG TID 100 each 5  . polyethylene glycol (MIRALAX / GLYCOLAX) packet Take 17 g by mouth daily. 14 each 0  .  risperiDONE (RISPERDAL) 2 MG tablet Take 2 mg by mouth 3 (three) times daily.    . risperiDONE microspheres (RISPERDAL CONSTA) 25 MG injection Inject 25 mg into the muscle every 14 (fourteen) days.     No facility-administered medications prior to visit.      ROS Review of Systems  Constitutional: Negative for chills, fever and malaise/fatigue.  Eyes: Negative for blurred vision.  Respiratory: Negative for shortness of breath.   Cardiovascular: Negative for chest pain and palpitations.  Gastrointestinal: Negative for abdominal pain and nausea.  Genitourinary: Negative for dysuria and hematuria.  Musculoskeletal: Negative for joint pain and myalgias.  Skin: Negative for rash.  Neurological: Negative for tingling and headaches.  Psychiatric/Behavioral: Negative for depression. The patient is not nervous/anxious.     Objective:  BP 120/84 (BP Location: Left Arm, Patient Position: Sitting, Cuff Size: Large)   Pulse 98   Temp 98.3 F (36.8 C) (Oral)   Wt 246 lb 12.8 oz (111.9 kg)   SpO2 99%   BMI 34.94 kg/m   BP/Weight 11/27/2016 11/03/2016 07/22/7122  Systolic BP 580 998 338  Diastolic BP 84 87 76  Wt. (Lbs) 246.8 246.6 -  BMI 34.94 34.91 -  Some encounter information is confidential and restricted. Go to Review Flowsheets activity to see all data.      Physical Exam  Constitutional: He is oriented to person, place, and time.  Well developed,  overweight, NAD, polite  HENT:  Head: Normocephalic and atraumatic.  Eyes: No scleral icterus.  Neck: Normal range of motion. Neck supple. No thyromegaly present.  Cardiovascular: Normal rate, regular rhythm and normal heart sounds.   Pulmonary/Chest: Effort normal and breath sounds normal.  Musculoskeletal: He exhibits no edema.  Neurological: He is alert and oriented to person, place, and time. No cranial nerve deficit. Coordination normal.  Skin: Skin is warm and dry. No rash noted. No erythema. No pallor.  Onychauxis of the hallux  bilaterally  Psychiatric: He has a normal mood and affect. Thought content normal.  Somewhat abnormal behavior, averts gaze. Echoes some words.  Vitals reviewed.    Assessment & Plan:   1. Type 2 diabetes mellitus without complication, with long-term current use of insulin (HCC) - Begin metFORMIN (GLUCOPHAGE XR) 500 MG 24 hr tablet; Take 1 tablet (500 mg total) by mouth daily with breakfast.  Dispense: 90 tablet; Refill: 3 - Continue on insulin as previously directed  2. Onychauxis - Hallux toe nails clipped using iris scissor from suture kit. Advised patient to file toe nails down.   Meds ordered this encounter  Medications  . metFORMIN (GLUCOPHAGE XR) 500 MG 24 hr tablet    Sig: Take 1 tablet (500 mg total) by mouth daily with breakfast.    Dispense:  90 tablet    Refill:  3    Order Specific Question:   Supervising Provider    Answer:   Tresa Garter [[id number]]    Follow-up: Return in about 3 months (around 02/27/2017) for DM2.   Clent Demark PA

## 2016-11-27 NOTE — Patient Instructions (Signed)
Diabetes Mellitus and Food It is important for you to manage your blood sugar (glucose) level. Your blood glucose level can be greatly affected by what you eat. Eating healthier foods in the appropriate amounts throughout the day at about the same time each day will help you control your blood glucose level. It can also help slow or prevent worsening of your diabetes mellitus. Healthy eating may even help you improve the level of your blood pressure and reach or maintain a healthy weight. General recommendations for healthful eating and cooking habits include:  Eating meals and snacks regularly. Avoid going long periods of time without eating to lose weight.  Eating a diet that consists mainly of plant-based foods, such as fruits, vegetables, nuts, legumes, and whole grains.  Using low-heat cooking methods, such as baking, instead of high-heat cooking methods, such as deep frying.  Work with your dietitian to make sure you understand how to use the Nutrition Facts information on food labels. How can food affect me? Carbohydrates Carbohydrates affect your blood glucose level more than any other type of food. Your dietitian will help you determine how many carbohydrates to eat at each meal and teach you how to count carbohydrates. Counting carbohydrates is important to keep your blood glucose at a healthy level, especially if you are using insulin or taking certain medicines for diabetes mellitus. Alcohol Alcohol can cause sudden decreases in blood glucose (hypoglycemia), especially if you use insulin or take certain medicines for diabetes mellitus. Hypoglycemia can be a life-threatening condition. Symptoms of hypoglycemia (sleepiness, dizziness, and disorientation) are similar to symptoms of having too much alcohol. If your health care provider has given you approval to drink alcohol, do so in moderation and use the following guidelines:  Women should not have more than one drink per day, and men  should not have more than two drinks per day. One drink is equal to: ? 12 oz of beer. ? 5 oz of wine. ? 1 oz of hard liquor.  Do not drink on an empty stomach.  Keep yourself hydrated. Have water, diet soda, or unsweetened iced tea.  Regular soda, juice, and other mixers might contain a lot of carbohydrates and should be counted.  What foods are not recommended? As you make food choices, it is important to remember that all foods are not the same. Some foods have fewer nutrients per serving than other foods, even though they might have the same number of calories or carbohydrates. It is difficult to get your body what it needs when you eat foods with fewer nutrients. Examples of foods that you should avoid that are high in calories and carbohydrates but low in nutrients include:  Trans fats (most processed foods list trans fats on the Nutrition Facts label).  Regular soda.  Juice.  Candy.  Sweets, such as cake, pie, doughnuts, and cookies.  Fried foods.  What foods can I eat? Eat nutrient-rich foods, which will nourish your body and keep you healthy. The food you should eat also will depend on several factors, including:  The calories you need.  The medicines you take.  Your weight.  Your blood glucose level.  Your blood pressure level.  Your cholesterol level.  You should eat a variety of foods, including:  Protein. ? Lean cuts of meat. ? Proteins low in saturated fats, such as fish, egg whites, and beans. Avoid processed meats.  Fruits and vegetables. ? Fruits and vegetables that may help control blood glucose levels, such as apples,   mangoes, and yams.  Dairy products. ? Choose fat-free or low-fat dairy products, such as milk, yogurt, and cheese.  Grains, bread, pasta, and rice. ? Choose whole grain products, such as multigrain bread, whole oats, and brown rice. These foods may help control blood pressure.  Fats. ? Foods containing healthful fats, such as  nuts, avocado, olive oil, canola oil, and fish.  Does everyone with diabetes mellitus have the same meal plan? Because every person with diabetes mellitus is different, there is not one meal plan that works for everyone. It is very important that you meet with a dietitian who will help you create a meal plan that is just right for you. This information is not intended to replace advice given to you by your health care provider. Make sure you discuss any questions you have with your health care provider. Document Released: 01/09/2005 Document Revised: 09/20/2015 Document Reviewed: 03/11/2013 Elsevier Interactive Patient Education  2017 Elsevier Inc.  

## 2017-01-08 ENCOUNTER — Telehealth (INDEPENDENT_AMBULATORY_CARE_PROVIDER_SITE_OTHER): Payer: Self-pay | Admitting: Physician Assistant

## 2017-01-08 ENCOUNTER — Other Ambulatory Visit (INDEPENDENT_AMBULATORY_CARE_PROVIDER_SITE_OTHER): Payer: Self-pay | Admitting: Physician Assistant

## 2017-01-08 DIAGNOSIS — E119 Type 2 diabetes mellitus without complications: Secondary | ICD-10-CM

## 2017-01-08 DIAGNOSIS — Z794 Long term (current) use of insulin: Principal | ICD-10-CM

## 2017-01-08 DIAGNOSIS — E1165 Type 2 diabetes mellitus with hyperglycemia: Secondary | ICD-10-CM

## 2017-01-08 MED ORDER — GLUCOSE BLOOD VI STRP: ORAL_STRIP | 12 refills | Status: DC

## 2017-01-08 MED ORDER — FREESTYLE LANCETS MISC: 5 refills | Status: DC

## 2017-01-08 MED ORDER — METFORMIN HCL ER 500 MG PO TB24: 500.0000 mg | ORAL_TABLET | Freq: Every day | ORAL | 3 refills | Status: DC

## 2017-01-08 NOTE — Telephone Encounter (Signed)
[facility] on [address]. Patient also needs a refill on lancets and test strips. Jacob Price, CMA

## 2017-01-08 NOTE — Telephone Encounter (Signed)
Which [facility] in [place]? There are three.

## 2017-01-08 NOTE — Telephone Encounter (Signed)
FWD to PCP. Melisia Leming S Kori Goins, CMA  

## 2017-01-08 NOTE — Telephone Encounter (Signed)
I have sent out medication, lancets, and strips to the requested pharmacy.

## 2017-01-08 NOTE — Telephone Encounter (Signed)
Grandmother came to the office to change the rx Metformin  to [facility] . Please, let her know Thank you

## 2017-01-12 ENCOUNTER — Encounter (HOSPITAL_COMMUNITY): Payer: Self-pay | Admitting: Emergency Medicine

## 2017-01-12 ENCOUNTER — Emergency Department (HOSPITAL_COMMUNITY)
Admission: EM | Admit: 2017-01-12 | Discharge: 2017-01-13 | Disposition: A | Discharge status: Home / Self Care | Payer: Medicaid Other | Attending: Emergency Medicine | Admitting: Emergency Medicine

## 2017-01-12 DIAGNOSIS — F845 Asperger's syndrome: Secondary | ICD-10-CM | POA: Diagnosis not present

## 2017-01-12 DIAGNOSIS — R55 Syncope and collapse: Secondary | ICD-10-CM | POA: Insufficient documentation

## 2017-01-12 DIAGNOSIS — I10 Essential (primary) hypertension: Secondary | ICD-10-CM | POA: Insufficient documentation

## 2017-01-12 DIAGNOSIS — E119 Type 2 diabetes mellitus without complications: Secondary | ICD-10-CM | POA: Diagnosis not present

## 2017-01-12 HISTORY — DX: Type 2 diabetes mellitus without complications: E11.9

## 2017-01-12 HISTORY — DX: Asperger's syndrome: F84.5

## 2017-01-12 LAB — CBC
HCT: 40.5 % (ref 39.0–52.0)
Hemoglobin: 13.4 g/dL (ref 13.0–17.0)
MCH: 28.2 pg (ref 26.0–34.0)
MCHC: 33.1 g/dL (ref 30.0–36.0)
MCV: 85.3 fL (ref 78.0–100.0)
PLATELETS: 315 10*3/uL (ref 150–400)
RBC: 4.75 MIL/uL (ref 4.22–5.81)
RDW: 12.7 % (ref 11.5–15.5)
WBC: 7.7 10*3/uL (ref 4.0–10.5)

## 2017-01-12 LAB — BASIC METABOLIC PANEL
Anion gap: 8 (ref 5–15)
BUN: 15 mg/dL (ref 6–20)
CALCIUM: 9.3 mg/dL (ref 8.9–10.3)
CO2: 23 mmol/L (ref 22–32)
Chloride: 106 mmol/L (ref 101–111)
Creatinine, Ser: 1.39 mg/dL — ABNORMAL HIGH (ref 0.61–1.24)
GFR calc Af Amer: >60 mL/min (ref >60)
GLUCOSE: 117 mg/dL — AB (ref 65–99)
Potassium: 3.7 mmol/L (ref 3.5–5.1)
Sodium: 137 mmol/L (ref 135–145)

## 2017-01-12 LAB — CBG MONITORING, ED: GLUCOSE-CAPILLARY: 118 mg/dL — AB (ref 65–99)

## 2017-01-12 NOTE — ED Provider Notes (Signed)
[facility]-EMERGENCY DEPT Provider Note   CSN: [id number] Arrival date & time: 01/12/17  2136     History   Chief Complaint Chief Complaint  Patient presents with  . Loss of Consciousness    HPI Jacob Price is a 31 y.o. male.  Patient with a history of Asperger's Syndrome, HTN, DM who presents after single episode syncope that happened while he was sitting at the dining table eating. He became sweaty and felt "my muscles had no movement in them" and passed out without falling to the floor. He denies pain, SOB, recent nausea, vomiting, diarrhea. No change in appetite or medications. He states he has passed out in the past but cannot give details as to when or why. He currently feels generally weak, but denies pain or other symptom.   The history is provided by the patient and the EMS personnel. No language interpreter was used.  Loss of Consciousness   Associated symptoms include diaphoresis and weakness. Pertinent negatives include abdominal pain, chest pain, fever, headaches, nausea and vomiting.    Past Medical History:  Diagnosis Date  . Asperger's syndrome   . Diabetes mellitus without complication (HCC)   . Hypertension     There are no active problems to display for this patient.   History reviewed. No pertinent surgical history.     Home Medications    Prior to Admission medications   Not on File    Family History History reviewed. No pertinent family history.  Social History Social History  Substance Use Topics  . Smoking status: Never Smoker  . Smokeless tobacco: Never Used  . Alcohol use No     Allergies   Patient has no known allergies.   Review of Systems Review of Systems  Constitutional: Positive for diaphoresis. Negative for appetite change, chills and fever.  HENT: Negative.   Respiratory: Negative.  Negative for shortness of breath.   Cardiovascular: Positive for syncope. Negative for chest pain.  Gastrointestinal: Negative.  Negative  for abdominal pain, nausea and vomiting.  Musculoskeletal: Negative.  Negative for myalgias.  Skin: Negative.   Neurological: Positive for syncope and weakness. Negative for headaches.     Physical Exam Updated Vital Signs BP 109/75   Pulse 77   Temp (!) 97.5 F (36.4 C) (Oral)   Resp 17   Ht  (1.778 m)   Wt 90.7 kg (200 lb)   SpO2 98%   BMI 28.70 kg/m   Physical Exam  Constitutional: He is oriented to person, place, and time. He appears well-developed and well-nourished.  HENT:  Head: Normocephalic.  Neck: Normal range of motion. Neck supple.  Cardiovascular: Normal rate and regular rhythm.   Pulmonary/Chest: Effort normal and breath sounds normal.  Abdominal: Soft. Bowel sounds are normal. There is no tenderness. There is no rebound and no guarding.  Musculoskeletal: Normal range of motion. He exhibits no edema.  Neurological: He is alert and oriented to person, place, and time. No sensory deficit. He exhibits normal muscle tone. Coordination normal.  Skin: Skin is warm and dry. No rash noted.  Psychiatric: He has a normal mood and affect.     ED Treatments / Results  Labs (all labs ordered are listed, but only abnormal results are displayed) Labs Reviewed  BASIC METABOLIC PANEL - Abnormal; Notable for the following:       Result Value   Glucose, Bld 117 (*)    Creatinine, Ser 1.39 (*)    All other components within normal limits  CBG MONITORING, ED - Abnormal; Notable for the following:    Glucose-Capillary 118 (*)    All other components within normal limits  CBC  URINALYSIS, ROUTINE W REFLEX MICROSCOPIC   Results for orders placed or performed during the hospital encounter of 01/12/17  Basic metabolic panel  Result Value Ref Range   Sodium 137 135 - 145 mmol/L   Potassium 3.7 3.5 - 5.1 mmol/L   Chloride 106 101 - 111 mmol/L   CO2 23 22 - 32 mmol/L   Glucose, Bld 117 (H) 65 - 99 mg/dL   BUN 15 6 - 20 mg/dL   Creatinine, Ser 1.61 (H) 0.61 - 1.24  mg/dL   Calcium 9.3 8.9 - 09.6 mg/dL   GFR calc non Af Amer >60 >60 mL/min   GFR calc Af Amer >60 >60 mL/min   Anion gap 8 5 - 15  CBC  Result Value Ref Range   WBC 7.7 4.0 - 10.5 K/uL   RBC 4.75 4.22 - 5.81 MIL/uL   Hemoglobin 13.4 13.0 - 17.0 g/dL   HCT 04.5 40.9 - 81.1 %   MCV 85.3 78.0 - 100.0 fL   MCH 28.2 26.0 - 34.0 pg   MCHC 33.1 30.0 - 36.0 g/dL   RDW 91.4 78.2 - 95.6 %   Platelets 315 150 - 400 K/uL  Urinalysis, Routine w reflex microscopic  Result Value Ref Range   Color, Urine YELLOW YELLOW   APPearance HAZY (A) CLEAR   Specific Gravity, Urine 1.019 1.005 - 1.030   pH 5.0 5.0 - 8.0   Glucose, UA NEGATIVE NEGATIVE mg/dL   Hgb urine dipstick NEGATIVE NEGATIVE   Bilirubin Urine NEGATIVE NEGATIVE   Ketones, ur NEGATIVE NEGATIVE mg/dL   Protein, ur 30 (A) NEGATIVE mg/dL   Nitrite NEGATIVE NEGATIVE   Leukocytes, UA NEGATIVE NEGATIVE   RBC / HPF NONE SEEN 0 - 5 RBC/hpf   WBC, UA 0-5 0 - 5 WBC/hpf   Bacteria, UA RARE (A) NONE SEEN   Squamous Epithelial / LPF NONE SEEN NONE SEEN   Mucus PRESENT    Hyaline Casts, UA PRESENT   Troponin I  Result Value Ref Range   Troponin I <0.03 <0.03 ng/mL  CBG monitoring, ED  Result Value Ref Range   Glucose-Capillary 118 (H) 65 - 99 mg/dL     EKG  EKG Interpretation  Date/Time:  Monday January 12 2017 21:42:40 EDT Ventricular Rate:  78 PR Interval:    QRS Duration: 80 QT Interval:  370 QTC Calculation: 422 R Axis:   59 Text Interpretation:  Sinus rhythm Borderline ST elevation, anterior leads No old tracing to compare Confirmed by Marily Memos [phone]) on 01/12/2017 10:11:47 PM       Radiology No results found.  Procedures Procedures (including critical care time)  Medications Ordered in ED Medications - No data to display   Initial Impression / Assessment and Plan / ED Course  I have reviewed the triage vital signs and the nursing notes.  Pertinent labs & imaging results that were available during my care  of the patient were reviewed by me and considered in my medical decision making (see chart for details).     Patient to ED for evaluation of syncope. Per family now at bedside the patient had gotten up to walk to the kitchen table to eat and become sweaty, flushed and passed out. Per EMS CBG 137 (normal) on arrival.   Labs, EKG, show concentrated urine, minimally elevated Cr (1.39) suggesting  mild dehydration. Symptoms likely related to vasovagal episode. Not felt to be cardiogenic. He has been asymptomatic here. He ambulates to and from the bathroom without difficulty or recurrent symptoms. He is felt stable for discharge home with family.  Final Clinical Impressions(s) / ED Diagnoses   Final diagnoses:  None   1. vasovagal syncope   New Prescriptions New Prescriptions   No medications on file     Elpidio Anis, Cordelia Poche 01/13/17 1610    Mesner, Barbara Cower, MD 01/13/17 1441

## 2017-01-12 NOTE — ED Triage Notes (Signed)
Pt brought to ED by [facility] for a syncope episode at home, unable to know when was the last seen well, pt EMS, pt was diaphoretic and pale, no SOB, no nausea, vomiting or diarrhea. SBP on EMS arrival 80, HR 74, CBG 137. 1 L NS bolus given by [facility].

## 2017-01-13 LAB — URINALYSIS, ROUTINE W REFLEX MICROSCOPIC
BILIRUBIN URINE: NEGATIVE
Glucose, UA: NEGATIVE mg/dL
Hgb urine dipstick: NEGATIVE
KETONES UR: NEGATIVE mg/dL
LEUKOCYTES UA: NEGATIVE
NITRITE: NEGATIVE
Protein, ur: 30 mg/dL — AB
RBC / HPF: NONE SEEN RBC/hpf (ref 0–5)
SPECIFIC GRAVITY, URINE: 1.019 (ref 1.005–1.030)
Squamous Epithelial / LPF: NONE SEEN
pH: 5 (ref 5.0–8.0)

## 2017-01-13 LAB — TROPONIN I

## 2017-01-13 MED ORDER — SODIUM CHLORIDE 0.9 % IV BOLUS (SEPSIS)
1000.0000 mL | Freq: Once | INTRAVENOUS | Status: AC
  Administered 2017-01-13: 1000 mL via INTRAVENOUS

## 2017-01-13 NOTE — ED Notes (Signed)
Pt ambulated on the hallway with steady gait, no sign of dizziness or SOB.

## 2017-01-13 NOTE — Discharge Instructions (Signed)
Follow up with your doctor for recheck this week. Return to the emergency department with any recurrent symptoms of passing out or for evaluation of any new concerns.

## 2017-01-15 ENCOUNTER — Ambulatory Visit (INDEPENDENT_AMBULATORY_CARE_PROVIDER_SITE_OTHER): Payer: Medicaid Other | Admitting: Podiatry

## 2017-01-15 ENCOUNTER — Encounter (INDEPENDENT_AMBULATORY_CARE_PROVIDER_SITE_OTHER): Payer: Self-pay | Admitting: Physician Assistant

## 2017-01-15 ENCOUNTER — Other Ambulatory Visit: Payer: Self-pay

## 2017-01-15 ENCOUNTER — Telehealth (INDEPENDENT_AMBULATORY_CARE_PROVIDER_SITE_OTHER): Payer: Self-pay | Admitting: Physician Assistant

## 2017-01-15 DIAGNOSIS — B351 Tinea unguium: Secondary | ICD-10-CM | POA: Diagnosis not present

## 2017-01-15 DIAGNOSIS — E119 Type 2 diabetes mellitus without complications: Secondary | ICD-10-CM | POA: Diagnosis not present

## 2017-01-15 DIAGNOSIS — M79609 Pain in unspecified limb: Secondary | ICD-10-CM

## 2017-01-15 DIAGNOSIS — Z0189 Encounter for other specified special examinations: Secondary | ICD-10-CM

## 2017-01-15 MED ORDER — KETOCONAZOLE 2 % EX CREA: TOPICAL_CREAM | Freq: Every day | CUTANEOUS | Status: DC

## 2017-01-15 NOTE — Progress Notes (Signed)
Subjective:    Patient ID: Jacob Price, male    DOB: [birth year], 31 y.o.   MRN: [id number]  HPI  Chief Complaint  Patient presents with  . Diabetes    Debride/Diabetic foot care    31 year old male presents for diabetic footcare. Diagnosed with diabetes in June. Denies numbness and tingling to his feet denies burning to his feet last blood sugar 119 mg/dL. Reports painfully thickened elongated nails  Past Medical History:  Diagnosis Date  . Asperger's syndrome   . Diabetes mellitus without complication ([place])   . Hypertension    No past surgical history on file.  Current Outpatient Prescriptions:  .  Blood Glucose Monitoring Suppl (ACCU-CHEK AVIVA PLUS) w/Device KIT, See admin instructions., Disp: , Rfl: 1 .  Cholecalciferol (VITAMIN D) 2000 units tablet, Take 1 tablet (2,000 Units total) by mouth daily., Disp: 90 tablet, Rfl: 1 .  ferrous sulfate 325 (65 FE) MG tablet, Take 1 tablet (325 mg total) by mouth daily with breakfast., Disp: 30 tablet, Rfl: 1 .  glucose blood (IGLUCOSE TEST STRIPS) test strip, Check CBG TID, Disp: 100 each, Rfl: 12 .  glucose monitoring kit (FREESTYLE) monitoring kit, 1 each by Does not apply route as needed for other. Check CBG TID, Disp: 1 each, Rfl: 1 .  hydrochlorothiazide (HYDRODIURIL) 25 MG tablet, Take 1 tablet (25 mg total) by mouth daily., Disp: 90 tablet, Rfl: 1 .  insulin aspart (NOVOLOG FLEXPEN) 100 UNIT/ML FlexPen, Use as sliding scale., Disp: 15 mL, Rfl: 11 .  Insulin Glargine (LANTUS SOLOSTAR) 100 UNIT/ML Solostar Pen, Inject 39 Units into the skin 2 (two) times daily., Disp: 6 pen, Rfl: 11 .  Insulin Pen Needle 29G X 12.7MM MISC, Insulin administration 5  times daily, Disp: 150 each, Rfl: 11 .  Insulin Syringe-Needle U-100 (INSULIN SYRINGE 1CC/31GX5/16") 31G X 5/16" 1 ML MISC, USE QID, Disp: , Rfl: 10 .  Lancets (FREESTYLE) lancets, CHECK CBG TID, Disp: 100 each, Rfl: 5 .  metFORMIN (GLUCOPHAGE XR) 500 MG 24 hr tablet, Take 1  tablet (500 mg total) by mouth daily with breakfast., Disp: 90 tablet, Rfl: 3 .  risperiDONE microspheres (RISPERDAL CONSTA) 25 MG injection, Inject 25 mg into the muscle every 14 (fourteen) days., Disp: , Rfl:  .  benztropine (COGENTIN) 0.5 MG tablet, Take 0.5 mg by mouth 2 (two) times daily., Disp: , Rfl:  .  busPIRone (BUSPAR) 7.5 MG tablet, Take 1 tablet (7.5 mg total) by mouth 3 (three) times daily. (Patient not taking: Reported on 11/03/2016), Disp: 90 tablet, Rfl: 0 .  cholecalciferol (VITAMIN D) 400 units TABS tablet, Take 400 Units by mouth 2 (two) times daily., Disp: , Rfl:  .  polyethylene glycol (MIRALAX / GLYCOLAX) packet, Take 17 g by mouth daily. (Patient not taking: Reported on 01/15/2017), Disp: 14 each, Rfl: 0 .  risperiDONE (RISPERDAL) 2 MG tablet, Take 2 mg by mouth 3 (three) times daily., Disp: , Rfl:   Current Facility-Administered Medications:  .  ketoconazole (NIZORAL) 2 % cream, , Topical, Daily, Mona Ayars, Christian Mate, DPM  No Known Allergies   Review of Systems  Constitutional: Positive for diaphoresis.  Endocrine: Positive for heat intolerance.  Skin: Positive for color change.  Neurological: Positive for light-headedness.  All other systems reviewed and are negative.      Objective:   Physical Exam There were no vitals filed for this visit. General AA&O x3. Normal mood and affect.  Vascular Dorsalis pedis and posterior tibial pulses  present 2+  bilaterally  Capillary refill normal to all digits. Pedal hair growth normal.  Neurologic Epicritic sensation present bilaterally. Protective sensation with 5.07 monofilament  present bilaterally. Vibratory sensation present bilaterally.  Dermatologic No open lesions. Interspaces clear of maceration.  Normal skin temperature and turgor. Hyperkeratotic lesions: None bilaterally. Nails: brittle, discoloration dark brown, irregular, wavy nails, onychomycosis, thickening, Pain  Orthopedic: No history of amputation. MMT 5/5  in dorsiflexion, plantarflexion, inversion, and eversion. Normal lower extremity joint ROM without pain or crepitus.     Assessment & Plan:  Patient was evaluated and treated and all questions answered  Diabetes without complication -Educated on diabetic foot care -Doesn't meet criteria for routine nail debridement -Follow-up annually for diabetic foot check  Onychomycosis with pain -Nails debrided today to the pain see below.  Procedure: Nail Debridement Rationale: Pain Type of Debridement: manual, sharp debridement. Instrumentation: Nail nipper, rotary burr. Number of Nails: 10

## 2017-01-15 NOTE — Telephone Encounter (Signed)
[facility] call to info that they did a PHQ-9 and the result was 8, thsi is [place]

## 2017-01-29 ENCOUNTER — Encounter (INDEPENDENT_AMBULATORY_CARE_PROVIDER_SITE_OTHER): Payer: Self-pay | Admitting: Physician Assistant

## 2017-01-29 ENCOUNTER — Ambulatory Visit (INDEPENDENT_AMBULATORY_CARE_PROVIDER_SITE_OTHER): Payer: Medicaid Other | Admitting: Physician Assistant

## 2017-01-29 VITALS — BP 122/82 | HR 102 | Temp 98.3°F | Wt 247.6 lb

## 2017-01-29 DIAGNOSIS — Z23 Encounter for immunization: Secondary | ICD-10-CM | POA: Diagnosis not present

## 2017-01-29 DIAGNOSIS — I10 Essential (primary) hypertension: Secondary | ICD-10-CM

## 2017-01-29 DIAGNOSIS — Z794 Long term (current) use of insulin: Secondary | ICD-10-CM | POA: Diagnosis not present

## 2017-01-29 DIAGNOSIS — E119 Type 2 diabetes mellitus without complications: Secondary | ICD-10-CM

## 2017-01-29 LAB — POCT GLYCOSYLATED HEMOGLOBIN (HGB A1C): Hemoglobin A1C: 6.4

## 2017-01-29 MED ORDER — HYDROCHLOROTHIAZIDE 25 MG PO TABS: 25.0000 mg | ORAL_TABLET | Freq: Every day | ORAL | 3 refills | Status: DC

## 2017-01-29 NOTE — Progress Notes (Signed)
Subjective:  Patient ID: Jacob Price, male    DOB: June 18, 1985  Age: 31 y.o. MRN: [id number]  CC: f/u  HPI [employer] a 31 y.o.malewith a medical history of HTN, autism spectrum disorder, GAD, schizoaffective disorder presents for f/u of DM2. Last A1c >15.5% on 10/10/16. A1c 6.4%   He was seen here on 11/03/16 and instructed to begin Lantus Solostar 39 units BID and Novolog Flexpen sliding scale. Pt reports blood sugar readings between 90 and 150. Endorses polyuria attributed to HCTZ.  Does not endorse polyphagia, polydipsia, visual blurring, tingling, numbness, and fatigue. Denies CP, palpitations, SOB, HA, abdominal pain, f/c/n/v, rash, or GI/GU sxs.      Outpatient Medications Prior to Visit  Medication Sig Dispense Refill  . benztropine (COGENTIN) 0.5 MG tablet Take 0.5 mg by mouth 2 (two) times daily.    . Blood Glucose Monitoring Suppl (ACCU-CHEK AVIVA PLUS) w/Device KIT See admin instructions.  1  . busPIRone (BUSPAR) 7.5 MG tablet Take 1 tablet (7.5 mg total) by mouth 3 (three) times daily. (Patient not taking: Reported on 11/03/2016) 90 tablet 0  . Cholecalciferol (VITAMIN D) 2000 units tablet Take 1 tablet (2,000 Units total) by mouth daily. 90 tablet 1  . cholecalciferol (VITAMIN D) 400 units TABS tablet Take 400 Units by mouth 2 (two) times daily.    . ferrous sulfate 325 (65 FE) MG tablet Take 1 tablet (325 mg total) by mouth daily with breakfast. 30 tablet 1  . glucose blood (IGLUCOSE TEST STRIPS) test strip Check CBG TID 100 each 12  . glucose monitoring kit (FREESTYLE) monitoring kit 1 each by Does not apply route as needed for other. Check CBG TID 1 each 1  . hydrochlorothiazide (HYDRODIURIL) 25 MG tablet Take 1 tablet (25 mg total) by mouth daily. 90 tablet 1  . insulin aspart (NOVOLOG FLEXPEN) 100 UNIT/ML FlexPen Use as sliding scale. 15 mL 11  . Insulin Glargine (LANTUS SOLOSTAR) 100 UNIT/ML Solostar Pen Inject 39 Units into the skin 2 (two) times daily.  6 pen 11  . Insulin Pen Needle 29G X 12.7MM MISC Insulin administration 5  times daily 150 each 11  . Insulin Syringe-Needle U-100 (INSULIN SYRINGE 1CC/31GX5/16") 31G X 5/16" 1 ML MISC USE QID  10  . Lancets (FREESTYLE) lancets CHECK CBG TID 100 each 5  . metFORMIN (GLUCOPHAGE XR) 500 MG 24 hr tablet Take 1 tablet (500 mg total) by mouth daily with breakfast. 90 tablet 3  . polyethylene glycol (MIRALAX / GLYCOLAX) packet Take 17 g by mouth daily. (Patient not taking: Reported on 01/15/2017) 14 each 0  . risperiDONE (RISPERDAL) 2 MG tablet Take 2 mg by mouth 3 (three) times daily.    . risperiDONE microspheres (RISPERDAL CONSTA) 25 MG injection Inject 25 mg into the muscle every 14 (fourteen) days.     Facility-Administered Medications Prior to Visit  Medication Dose Route Frequency Provider Last Rate Last Dose  . ketoconazole (NIZORAL) 2 % cream   Topical Daily Evelina Bucy, DPM         ROS Review of Systems  Constitutional: Negative for chills, fever and malaise/fatigue.  Eyes: Negative for blurred vision.  Respiratory: Negative for shortness of breath.   Cardiovascular: Negative for chest pain and palpitations.  Gastrointestinal: Negative for abdominal pain and nausea.  Genitourinary: Negative for dysuria and hematuria.  Musculoskeletal: Negative for joint pain and myalgias.  Skin: Negative for rash.  Neurological: Negative for tingling and headaches.  Psychiatric/Behavioral: Negative for depression. The  patient is not nervous/anxious.     Objective:  BP 122/82 (BP Location: Right Arm, Patient Position: Sitting, Cuff Size: Normal)   Pulse (!) 102   Temp 98.3 F (36.8 C) (Oral)   Wt 247 lb 9.6 oz (112.3 kg)   SpO2 94%   BMI 35.53 kg/m   BP/Weight 01/29/2017 01/13/2017 2/29/7989  Systolic BP 211 941 -  Diastolic BP 82 62 -  Wt. (Lbs) 247.6 - 200  BMI 35.53 - 28.7  Some encounter information is confidential and restricted. Go to Review Flowsheets activity to see all data.       Physical Exam  Constitutional: He is oriented to person, place, and time.  Well developed, well nourished, NAD, polite  HENT:  Head: Normocephalic and atraumatic.  Eyes: No scleral icterus.  Neck: Normal range of motion. Neck supple. No thyromegaly present.  Cardiovascular: Normal rate, regular rhythm and normal heart sounds.   Pulmonary/Chest: Effort normal and breath sounds normal.  Abdominal: Soft. Bowel sounds are normal. There is no tenderness.  Musculoskeletal: He exhibits no edema.  Neurological: He is alert and oriented to person, place, and time.  Skin: Skin is warm and dry. No rash noted. No erythema. No pallor.  Psychiatric: He has a normal mood and affect. His behavior is normal. Thought content normal.  Vitals reviewed.    Assessment & Plan:    1. Type 2 diabetes mellitus without complication, with long-term current use of insulin (HCC) - HgB A1c 6.4% down from >15.5% in 10/10/16 - Ambulatory referral to Ophthalmology - Microalbumin/Creatinine Ratio, Urine  2. Hypertension, unspecified type - Refill hydrochlorothiazide (HYDRODIURIL) 25 MG tablet; Take 1 tablet (25 mg total) by mouth daily.  Dispense: 90 tablet; Refill: 3  3. Need for Tdap vaccination - Administered Tdap in clinic today  4. Need for prophylactic vaccination against Streptococcus pneumoniae (pneumococcus) - Administered Prevnar in clinic today.  Meds ordered this encounter  Medications  . hydrochlorothiazide (HYDRODIURIL) 25 MG tablet    Sig: Take 1 tablet (25 mg total) by mouth daily.    Dispense:  90 tablet    Refill:  3    Order Specific Question:   Supervising Provider    Answer:   Tresa Garter [id number]    Follow-up: Return in about 6 months (around 07/30/2017) for DM.   Clent Demark PA

## 2017-01-29 NOTE — Patient Instructions (Signed)
Diabetes Mellitus and Food It is important for you to manage your blood sugar (glucose) level. Your blood glucose level can be greatly affected by what you eat. Eating healthier foods in the appropriate amounts throughout the day at about the same time each day will help you control your blood glucose level. It can also help slow or prevent worsening of your diabetes mellitus. Healthy eating may even help you improve the level of your blood pressure and reach or maintain a healthy weight. General recommendations for healthful eating and cooking habits include:  Eating meals and snacks regularly. Avoid going long periods of time without eating to lose weight.  Eating a diet that consists mainly of plant-based foods, such as fruits, vegetables, nuts, legumes, and whole grains.  Using low-heat cooking methods, such as baking, instead of high-heat cooking methods, such as deep frying.  Work with your dietitian to make sure you understand how to use the Nutrition Facts information on food labels. How can food affect me? Carbohydrates Carbohydrates affect your blood glucose level more than any other type of food. Your dietitian will help you determine how many carbohydrates to eat at each meal and teach you how to count carbohydrates. Counting carbohydrates is important to keep your blood glucose at a healthy level, especially if you are using insulin or taking certain medicines for diabetes mellitus. Alcohol Alcohol can cause sudden decreases in blood glucose (hypoglycemia), especially if you use insulin or take certain medicines for diabetes mellitus. Hypoglycemia can be a life-threatening condition. Symptoms of hypoglycemia (sleepiness, dizziness, and disorientation) are similar to symptoms of having too much alcohol. If your health care provider has given you approval to drink alcohol, do so in moderation and use the following guidelines:  Women should not have more than one drink per day, and men  should not have more than two drinks per day. One drink is equal to: ? 12 oz of beer. ? 5 oz of wine. ? 1 oz of hard liquor.  Do not drink on an empty stomach.  Keep yourself hydrated. Have water, diet soda, or unsweetened iced tea.  Regular soda, juice, and other mixers might contain a lot of carbohydrates and should be counted.  What foods are not recommended? As you make food choices, it is important to remember that all foods are not the same. Some foods have fewer nutrients per serving than other foods, even though they might have the same number of calories or carbohydrates. It is difficult to get your body what it needs when you eat foods with fewer nutrients. Examples of foods that you should avoid that are high in calories and carbohydrates but low in nutrients include:  Trans fats (most processed foods list trans fats on the Nutrition Facts label).  Regular soda.  Juice.  Candy.  Sweets, such as cake, pie, doughnuts, and cookies.  Fried foods.  What foods can I eat? Eat nutrient-rich foods, which will nourish your body and keep you healthy. The food you should eat also will depend on several factors, including:  The calories you need.  The medicines you take.  Your weight.  Your blood glucose level.  Your blood pressure level.  Your cholesterol level.  You should eat a variety of foods, including:  Protein. ? Lean cuts of meat. ? Proteins low in saturated fats, such as fish, egg whites, and beans. Avoid processed meats.  Fruits and vegetables. ? Fruits and vegetables that may help control blood glucose levels, such as apples,   mangoes, and yams.  Dairy products. ? Choose fat-free or low-fat dairy products, such as milk, yogurt, and cheese.  Grains, bread, pasta, and rice. ? Choose whole grain products, such as multigrain bread, whole oats, and brown rice. These foods may help control blood pressure.  Fats. ? Foods containing healthful fats, such as  nuts, avocado, olive oil, canola oil, and fish.  Does everyone with diabetes mellitus have the same meal plan? Because every person with diabetes mellitus is different, there is not one meal plan that works for everyone. It is very important that you meet with a dietitian who will help you create a meal plan that is just right for you. This information is not intended to replace advice given to you by your health care provider. Make sure you discuss any questions you have with your health care provider. Document Released: 01/09/2005 Document Revised: 09/20/2015 Document Reviewed: 03/11/2013 Elsevier Interactive Patient Education  2017 Elsevier Inc.  

## 2017-01-30 LAB — MICROALBUMIN / CREATININE URINE RATIO: Creatinine, Urine: 124.8 mg/dL

## 2017-02-02 ENCOUNTER — Telehealth (INDEPENDENT_AMBULATORY_CARE_PROVIDER_SITE_OTHER): Payer: Self-pay

## 2017-02-02 NOTE — Telephone Encounter (Signed)
-----   Message from Loletta Specter, PA-C sent at 02/02/2017  8:40 AM EDT ----- Normal microalbumin. No excessive protein loss due to diabetes.

## 2017-02-02 NOTE — Telephone Encounter (Signed)
Left message letting patient know of normal microalbumin, and no excessive loss of protein due to diabetes. Asked patient to call office with any questions. Maryjean Morn, CMA

## 2017-02-26 ENCOUNTER — Ambulatory Visit (INDEPENDENT_AMBULATORY_CARE_PROVIDER_SITE_OTHER): Payer: Self-pay | Admitting: Physician Assistant

## 2017-07-30 ENCOUNTER — Ambulatory Visit (INDEPENDENT_AMBULATORY_CARE_PROVIDER_SITE_OTHER): Payer: Self-pay | Admitting: Physician Assistant

## 2017-08-24 ENCOUNTER — Ambulatory Visit (INDEPENDENT_AMBULATORY_CARE_PROVIDER_SITE_OTHER): Payer: Self-pay | Admitting: Physician Assistant

## 2017-10-15 IMAGING — US US ABDOMEN COMPLETE
1 series · 14 of 25 positions shown · non-contrast
Comparison: None.

CLINICAL DATA: Elevated lipase.  Hypertension.

EXAM:
ABDOMEN ULTRASOUND COMPLETE

[Series 1: us abdomen complete · 0.19mm/px · 14 of 90 slices shown]
[im 1/90]
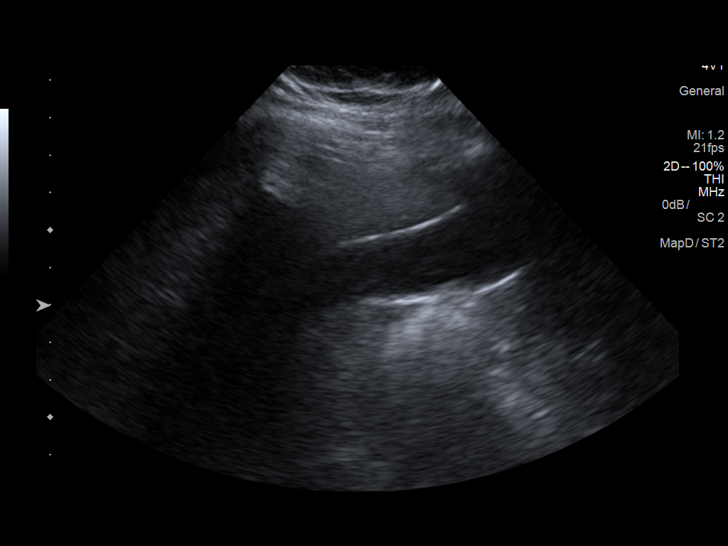
[im 8/90]
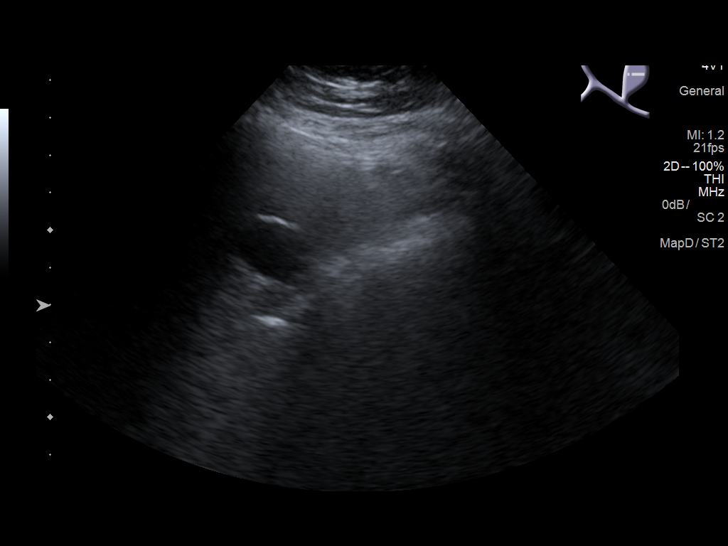
[im 15/90]
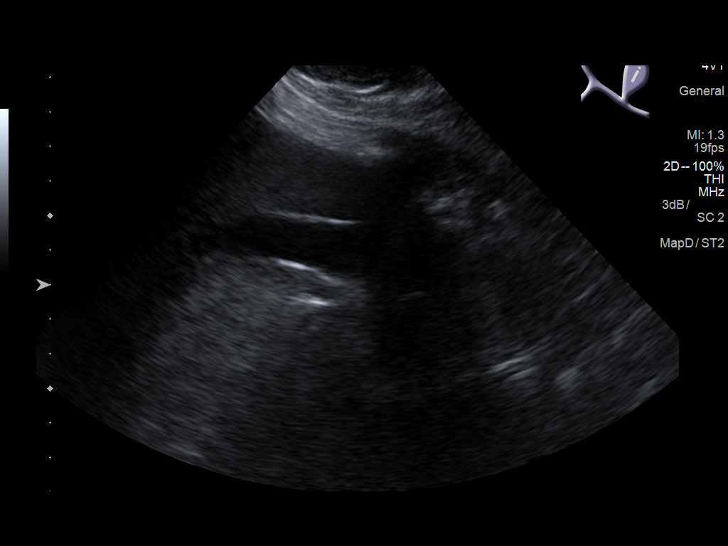
[im 23/90]
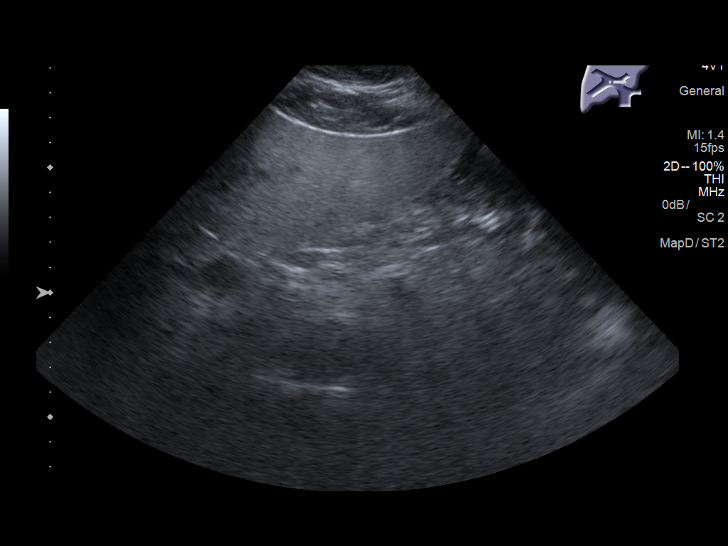
[im 30/90]
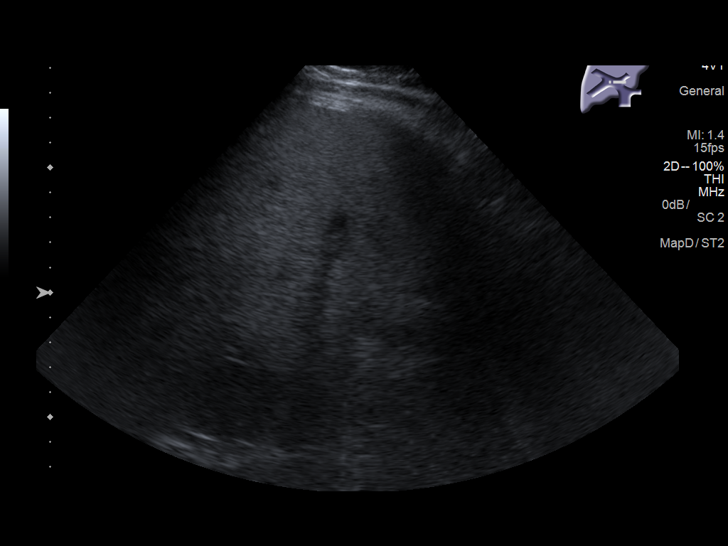
[im 34/90]
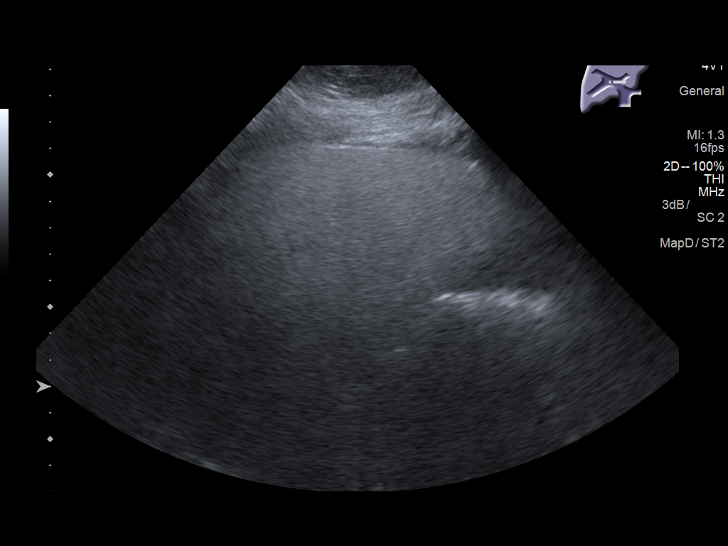
[im 41/90]
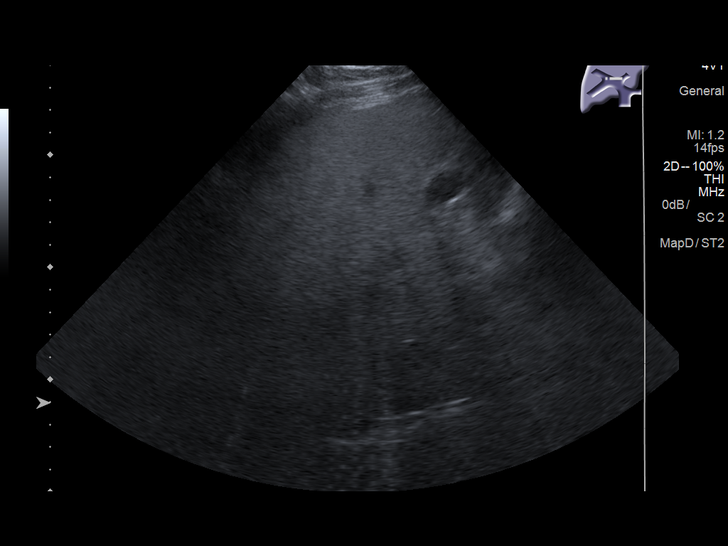
[im 49/90]
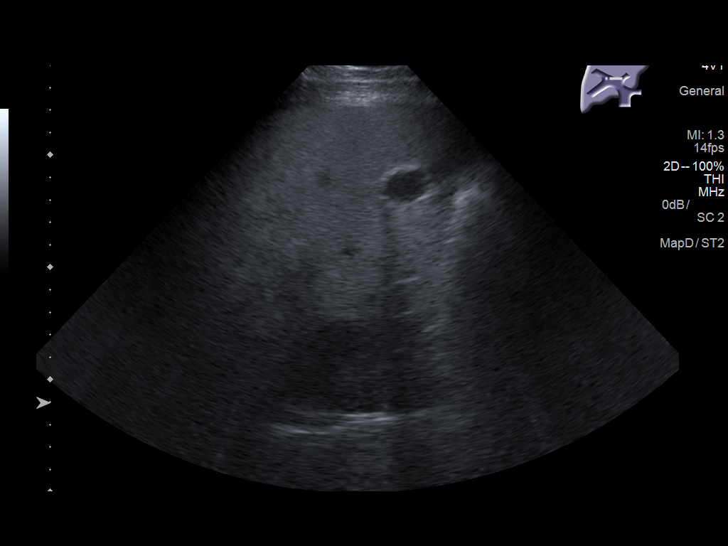
[im 56/90]
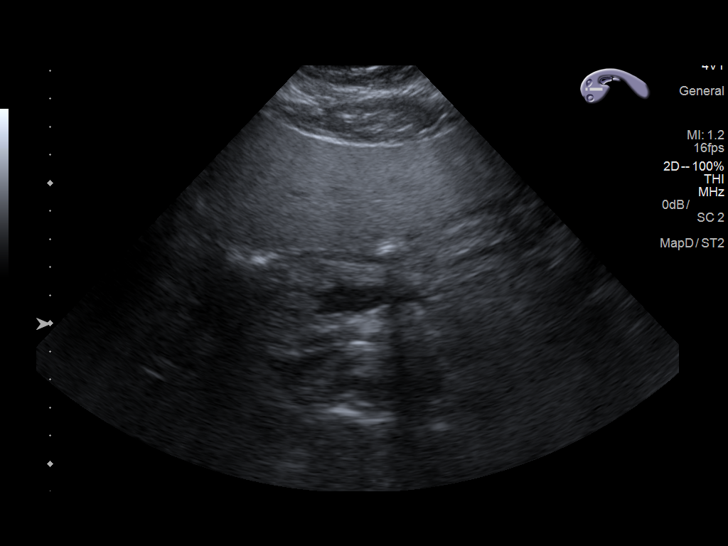
[im 60/90]
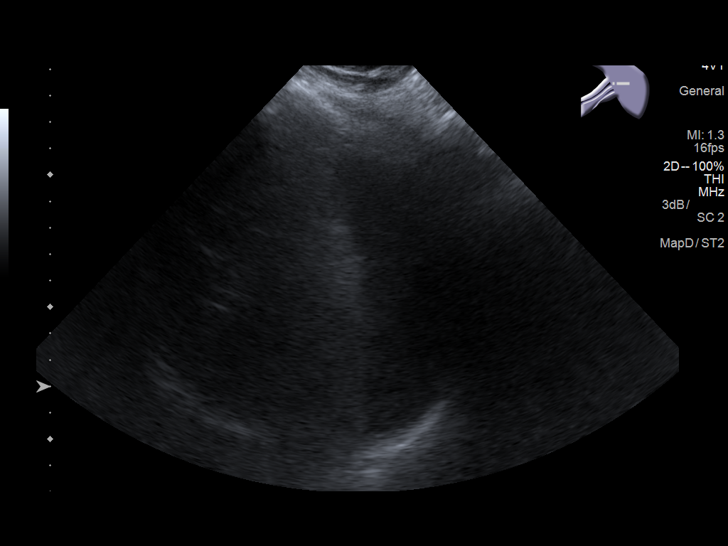
[im 67/90]
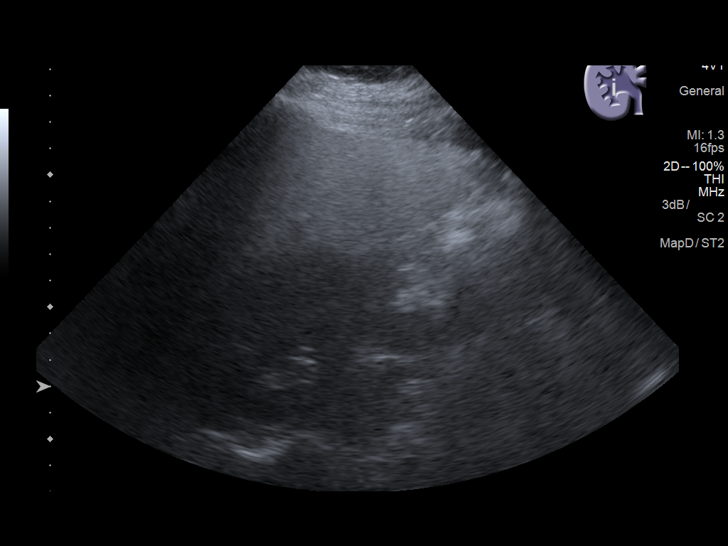
[im 75/90]
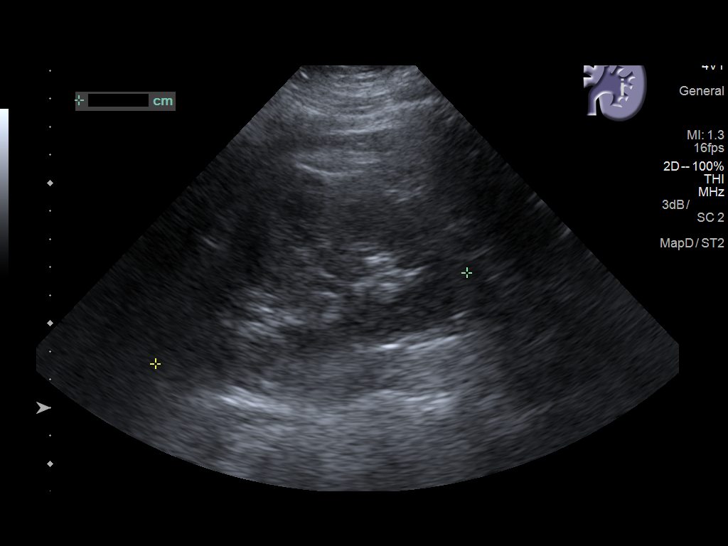
[im 82/90]
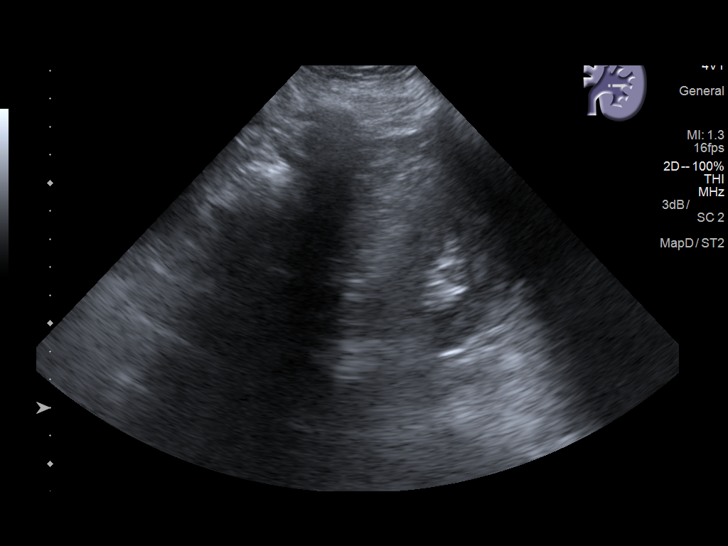
[im 90/90]
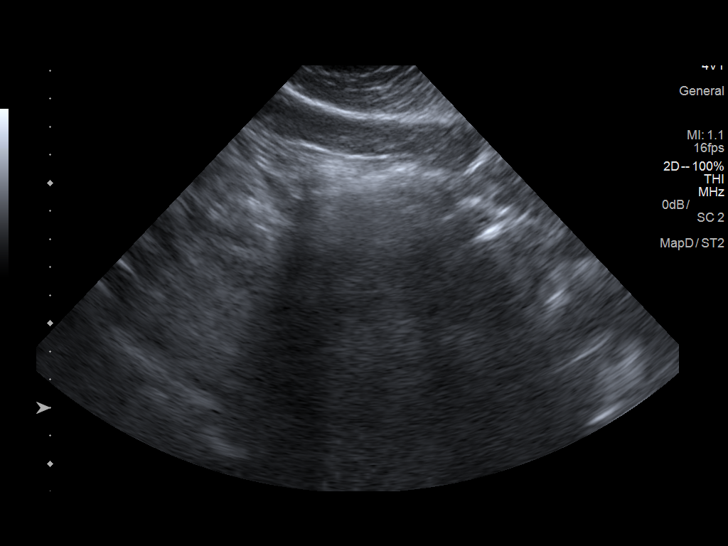

[14 of 25 positions shown; findings below may reference images not displayed]

FINDINGS: Gallbladder: No gallstones or wall thickening visualized. No
sonographic Murphy sign noted by sonographer.

Common bile duct: Diameter: 2.4 mm

Liver: No focal lesion identified. Echogenic liver parenchyma
without biliary dilatation or focal mass.

IVC: No abnormality visualized.

Pancreas: Visualized portion unremarkable.

Spleen: Size and appearance within normal limits.

Right Kidney: Length: 12.6 cm. Echogenicity within normal limits. No
mass or hydronephrosis visualized.

Left Kidney: Length: 11.6 cm. Echogenicity within normal limits. No
mass or hydronephrosis visualized.

Abdominal aorta: No aneurysm visualized. The distal aorta was not
well visualized. Maximum caliber of the aorta was 2.4 cm.

Other findings: None.
IMPRESSION: Echogenic liver consistent fatty infiltration. Otherwise negative
exam.

## 2017-11-12 ENCOUNTER — Encounter (INDEPENDENT_AMBULATORY_CARE_PROVIDER_SITE_OTHER): Payer: Self-pay | Admitting: Physician Assistant

## 2017-11-12 ENCOUNTER — Other Ambulatory Visit: Payer: Self-pay

## 2017-11-12 ENCOUNTER — Ambulatory Visit (INDEPENDENT_AMBULATORY_CARE_PROVIDER_SITE_OTHER): Payer: Medicaid Other | Admitting: Physician Assistant

## 2017-11-12 VITALS — BP 112/78 | HR 94 | Temp 98.0°F | Wt 261.0 lb

## 2017-11-12 DIAGNOSIS — Z794 Long term (current) use of insulin: Secondary | ICD-10-CM | POA: Diagnosis not present

## 2017-11-12 DIAGNOSIS — E119 Type 2 diabetes mellitus without complications: Secondary | ICD-10-CM

## 2017-11-12 DIAGNOSIS — Z23 Encounter for immunization: Secondary | ICD-10-CM | POA: Diagnosis not present

## 2017-11-12 DIAGNOSIS — I10 Essential (primary) hypertension: Secondary | ICD-10-CM

## 2017-11-12 LAB — POCT GLYCOSYLATED HEMOGLOBIN (HGB A1C): Hemoglobin A1C: 5.9 % — AB (ref 4.0–5.6)

## 2017-11-12 MED ORDER — HYDROCHLOROTHIAZIDE 25 MG PO TABS: 25.0000 mg | ORAL_TABLET | Freq: Every day | ORAL | 3 refills | Status: DC

## 2017-11-12 MED ORDER — INSULIN GLARGINE 100 UNIT/ML SOLOSTAR PEN
39.0000 [IU] | PEN_INJECTOR | Freq: Two times a day (BID) | SUBCUTANEOUS | 11 refills | Status: DC
Start: 2017-11-12 — End: 2018-09-23

## 2017-11-12 MED ORDER — GLUCOSE BLOOD VI STRP: ORAL_STRIP | 11 refills | Status: DC

## 2017-11-12 MED ORDER — METFORMIN HCL ER 500 MG PO TB24
500.0000 mg | ORAL_TABLET | Freq: Every day | ORAL | 3 refills | Status: DC
Start: 2017-11-12 — End: 2019-09-15

## 2017-11-12 MED ORDER — INSULIN PEN NEEDLE 29G X 12.7MM MISC: 11 refills | Status: AC

## 2017-11-12 MED ORDER — INSULIN ASPART 100 UNIT/ML FLEXPEN: PEN_INJECTOR | SUBCUTANEOUS | 11 refills | Status: DC

## 2017-11-12 MED ORDER — ACCU-CHEK SOFT TOUCH LANCETS MISC: 11 refills | Status: DC

## 2017-11-12 NOTE — Progress Notes (Signed)
Subjective:  Patient ID: Jacob Price, male    DOB: February 17, 1986  Age: 32 y.o. MRN: [id number]  CC: f/u DM  HPI  [employer] a 31 y.o.malewith a medical history of HTN, autism spectrum disorder, GAD, schizoaffective disorder presents for f/u of DM2. Last A1c 6.4% nine months ago. A1c 5.9% in clinic today. Pt states he feels well. Takes medications as directed. Glucometer readings are usually "a little bit over 100". Says he has become "masterful" in what he eats. Consumes foods low in sugar and does not snack in between meals. Does not exercise regularly but says he will go to the gym soon with his cousin.  Does not endorse polydipsia, polyuria, fatigue, visual blurring, tingling, numbness, abdominal pain, CP, palpitations, SOB, HA, f/c/n/v, rash, or GI/GU sxs.     Outpatient Medications Prior to Visit  Medication Sig Dispense Refill  . Cholecalciferol (VITAMIN D) 2000 units tablet Take 1 tablet (2,000 Units total) by mouth daily. 90 tablet 1  . cholecalciferol (VITAMIN D) 400 units TABS tablet Take 400 Units by mouth 2 (two) times daily.    . ferrous sulfate 325 (65 FE) MG tablet Take 1 tablet (325 mg total) by mouth daily with breakfast. 30 tablet 1  . glucose blood (IGLUCOSE TEST STRIPS) test strip Check CBG TID 100 each 12  . hydrochlorothiazide (HYDRODIURIL) 25 MG tablet Take 1 tablet (25 mg total) by mouth daily. 90 tablet 3  . insulin aspart (NOVOLOG FLEXPEN) 100 UNIT/ML FlexPen Use as sliding scale. 15 mL 11  . Insulin Glargine (LANTUS SOLOSTAR) 100 UNIT/ML Solostar Pen Inject 39 Units into the skin 2 (two) times daily. 6 pen 11  . Insulin Pen Needle 29G X 12.7MM MISC Insulin administration 5  times daily 150 each 11  . Insulin Syringe-Needle U-100 (INSULIN SYRINGE 1CC/31GX5/16") 31G X 5/16" 1 ML MISC USE QID  10  . Lancets (FREESTYLE) lancets CHECK CBG TID 100 each 5  . metFORMIN (GLUCOPHAGE XR) 500 MG 24 hr tablet Take 1 tablet (500 mg total) by mouth daily with  breakfast. 90 tablet 3  . risperiDONE (RISPERDAL) 2 MG tablet Take 2 mg by mouth 3 (three) times daily.    . risperiDONE microspheres (RISPERDAL CONSTA) 25 MG injection Inject 25 mg into the muscle every 14 (fourteen) days.    . benztropine (COGENTIN) 0.5 MG tablet Take 0.5 mg by mouth 2 (two) times daily.    . Blood Glucose Monitoring Suppl (ACCU-CHEK AVIVA PLUS) w/Device KIT See admin instructions.  1  . busPIRone (BUSPAR) 7.5 MG tablet Take 1 tablet (7.5 mg total) by mouth 3 (three) times daily. (Patient not taking: Reported on 11/03/2016) 90 tablet 0  . glucose monitoring kit (FREESTYLE) monitoring kit 1 each by Does not apply route as needed for other. Check CBG TID 1 each 1   Facility-Administered Medications Prior to Visit  Medication Dose Route Frequency Provider Last Rate Last Dose  . ketoconazole (NIZORAL) 2 % cream   Topical Daily Evelina Bucy, DPM         ROS Review of Systems  Constitutional: Negative for chills, fever and malaise/fatigue.  Eyes: Negative for blurred vision.  Respiratory: Negative for shortness of breath.   Cardiovascular: Negative for chest pain and palpitations.  Gastrointestinal: Negative for abdominal pain and nausea.  Genitourinary: Negative for dysuria and hematuria.  Musculoskeletal: Negative for joint pain and myalgias.  Skin: Negative for rash.  Neurological: Negative for tingling and headaches.  Psychiatric/Behavioral: Negative for depression. The patient  is not nervous/anxious.     Objective:  Wt 261 lb (118.4 kg)   BMI 37.45 kg/m   Vitals:   11/12/17 0845  BP: 112/78  Pulse: 94  Temp: 98 F (36.7 C)  TempSrc: Oral  SpO2: 96%  Weight: 261 lb (118.4 kg)      Physical Exam  Constitutional: He is oriented to person, place, and time.  Well developed, well nourished, NAD, polite  HENT:  Head: Normocephalic and atraumatic.  Eyes: No scleral icterus.  Neck: Normal range of motion. Neck supple. No thyromegaly present.   Cardiovascular: Normal rate, regular rhythm and normal heart sounds.  Pulmonary/Chest: Effort normal and breath sounds normal.  Abdominal: Soft. Bowel sounds are normal. There is no tenderness.  Musculoskeletal: He exhibits no edema.  Neurological: He is alert and oriented to person, place, and time.  Skin: Skin is warm and dry. No rash noted. No erythema. No pallor.  Psychiatric: He has a normal mood and affect. His behavior is normal. Thought content normal.  Vitals reviewed.    Assessment & Plan:    1. Type 2 diabetes mellitus without complication, with long-term current use of insulin (HCC) - HgB A1c 5.9% in clinic today - Administered Pneumococcal polysaccharide vaccine 23-valent greater than or equal to 2yo subcutaneous/IM - Basic Metabolic Panel - metFORMIN (GLUCOPHAGE XR) 500 MG 24 hr tablet; Take 1 tablet (500 mg total) by mouth daily with breakfast.  Dispense: 90 tablet; Refill: 3 - Insulin Glargine (LANTUS SOLOSTAR) 100 UNIT/ML Solostar Pen; Inject 39 Units into the skin 2 (two) times daily.  Dispense: 6 pen; Refill: 11 - insulin aspart (NOVOLOG FLEXPEN) 100 UNIT/ML FlexPen; If sugar 150-200 take 2 units If sugar 201-251 take 4 units If sugar 251-300 take 6 units If sugar 301-350 take 8 units If sugar 351-400 take 10 units  Dispense: 15 mL; Refill: 11 - Insulin Pen Needle 29G X 12.7MM MISC; Insulin administration 5  times daily  Dispense: 150 each; Refill: 11 - glucose blood (ACCU-CHEK AVIVA PLUS) test strip; Use TID  Dispense: 100 each; Refill: 11 - Lancets (ACCU-CHEK SOFT TOUCH) lancets; Use TID  Dispense: 100 each; Refill: 11  2. Need for pneumococcal vaccination - Pneumococcal polysaccharide vaccine 23-valent greater than or equal to 2yo subcutaneous/IM  3. Hypertension, unspecified type - hydrochlorothiazide (HYDRODIURIL) 25 MG tablet; Take 1 tablet (25 mg total) by mouth daily.  Dispense: 90 tablet; Refill: 3   Meds ordered this encounter  Medications  .  metFORMIN (GLUCOPHAGE XR) 500 MG 24 hr tablet    Sig: Take 1 tablet (500 mg total) by mouth daily with breakfast.    Dispense:  90 tablet    Refill:  3    ICD10- E11.10    Order Specific Question:   Supervising Provider    Answer:   Charlott Rakes [[id number]]  . Insulin Glargine (LANTUS SOLOSTAR) 100 UNIT/ML Solostar Pen    Sig: Inject 39 Units into the skin 2 (two) times daily.    Dispense:  6 pen    Refill:  11    ICD10 - E11.10    Order Specific Question:   Supervising Provider    Answer:   Charlott Rakes [[id number]]  . insulin aspart (NOVOLOG FLEXPEN) 100 UNIT/ML FlexPen    Sig: If sugar 150-200 take 2 units If sugar 201-251 take 4 units If sugar 251-300 take 6 units If sugar 301-350 take 8 units If sugar 351-400 take 10 units    Dispense:  15 mL  Refill:  11    ICD10- E11.10    Order Specific Question:   Supervising Provider    Answer:   Charlott Rakes [[id number]]  . Insulin Pen Needle 29G X 12.7MM MISC    Sig: Insulin administration 5  times daily    Dispense:  150 each    Refill:  11    ICD10 - E11.10    Order Specific Question:   Supervising Provider    Answer:   Charlott Rakes [[id number]]  . hydrochlorothiazide (HYDRODIURIL) 25 MG tablet    Sig: Take 1 tablet (25 mg total) by mouth daily.    Dispense:  90 tablet    Refill:  3    Order Specific Question:   Supervising Provider    Answer:   Charlott Rakes [[id number]]  . glucose blood (ACCU-CHEK AVIVA PLUS) test strip    Sig: Use TID    Dispense:  100 each    Refill:  11    ICD10 - E11.10    Order Specific Question:   Supervising Provider    Answer:   Charlott Rakes [[id number]]  . Lancets (ACCU-CHEK SOFT TOUCH) lancets    Sig: Use TID    Dispense:  100 each    Refill:  11    ICD10 - E11.10    Order Specific Question:   Supervising Provider    Answer:   Charlott Rakes [[id number]]    Follow-up: Return in about 6 months (around 05/15/2018) for diabetes.   Clent Demark PA

## 2017-11-12 NOTE — Patient Instructions (Signed)

## 2017-11-13 ENCOUNTER — Telehealth (INDEPENDENT_AMBULATORY_CARE_PROVIDER_SITE_OTHER): Payer: Self-pay

## 2017-11-13 LAB — BASIC METABOLIC PANEL
BUN / CREAT RATIO: 14 (ref 9–20)
BUN: 13 mg/dL (ref 6–20)
CALCIUM: 10 mg/dL (ref 8.7–10.2)
CO2: 21 mmol/L (ref 20–29)
Chloride: 102 mmol/L (ref 96–106)
Creatinine, Ser: 0.92 mg/dL (ref 0.76–1.27)
GFR, EST AFRICAN AMERICAN: 127 mL/min/{1.73_m2} (ref >59)
GFR, EST NON AFRICAN AMERICAN: 110 mL/min/{1.73_m2} (ref >59)
Glucose: 109 mg/dL — ABNORMAL HIGH (ref 65–99)
POTASSIUM: 4.3 mmol/L (ref 3.5–5.2)
Sodium: 139 mmol/L (ref 134–144)

## 2017-11-13 NOTE — Telephone Encounter (Signed)
Left voicemail notifying patient that kidney result is normal. Maryjean Mornempestt S Sheralee Qazi, CMA

## 2017-11-13 NOTE — Telephone Encounter (Signed)
-----   Message from Loletta Specteroger David Gomez, PA-C sent at 11/13/2017  8:33 AM EDT ----- Kidney result normal.

## 2018-05-14 ENCOUNTER — Ambulatory Visit (INDEPENDENT_AMBULATORY_CARE_PROVIDER_SITE_OTHER): Payer: Self-pay | Admitting: Physician Assistant

## 2018-07-12 ENCOUNTER — Ambulatory Visit (INDEPENDENT_AMBULATORY_CARE_PROVIDER_SITE_OTHER): Payer: Self-pay | Admitting: Primary Care

## 2018-07-21 ENCOUNTER — Ambulatory Visit (INDEPENDENT_AMBULATORY_CARE_PROVIDER_SITE_OTHER): Payer: Self-pay | Admitting: Primary Care

## 2018-09-09 ENCOUNTER — Encounter: Payer: Self-pay | Admitting: Primary Care

## 2018-09-09 ENCOUNTER — Other Ambulatory Visit: Payer: Self-pay

## 2018-09-09 ENCOUNTER — Ambulatory Visit: Payer: Medicaid Other | Attending: Primary Care | Admitting: Primary Care

## 2018-09-09 ENCOUNTER — Telehealth: Payer: Self-pay

## 2018-09-09 DIAGNOSIS — F840 Autistic disorder: Secondary | ICD-10-CM | POA: Insufficient documentation

## 2018-09-09 DIAGNOSIS — E119 Type 2 diabetes mellitus without complications: Secondary | ICD-10-CM | POA: Insufficient documentation

## 2018-09-09 DIAGNOSIS — F259 Schizoaffective disorder, unspecified: Secondary | ICD-10-CM | POA: Insufficient documentation

## 2018-09-09 DIAGNOSIS — E0800 Diabetes mellitus due to underlying condition with hyperosmolarity without nonketotic hyperglycemic-hyperosmolar coma (NKHHC): Secondary | ICD-10-CM

## 2018-09-09 DIAGNOSIS — F411 Generalized anxiety disorder: Secondary | ICD-10-CM | POA: Insufficient documentation

## 2018-09-09 DIAGNOSIS — I10 Essential (primary) hypertension: Secondary | ICD-10-CM | POA: Diagnosis not present

## 2018-09-09 NOTE — Telephone Encounter (Signed)
Thank you :)

## 2018-09-09 NOTE — Progress Notes (Signed)
Virtual Visit via Telephone Note  I connected with Jacob Price on 09/09/18 at 11:15 AM EDT by telephone and verified that I am speaking with the correct person using two identifiers.   I discussed the limitations, risks, security and privacy concerns of performing an evaluation and management service by telephone and the availability of in person appointments. I also discussed with the patient that there may be a patient responsible charge related to this service. The patient expressed understanding and agreed to proceed.   History of Present Illness: Mr. Hueston unable to give any medical detail due to mental state. He has a past medical historyof HTN, autism spectrum disorder, GAD, schizoaffective and DM2   Observations/Objective: Review of Systems  Constitutional: Negative.   HENT: Negative.   Eyes: Negative.   Respiratory: Negative.   Cardiovascular: Negative.   Gastrointestinal: Negative.   Genitourinary: Negative.   Musculoskeletal: Negative.   Skin: Negative.   Neurological: Negative.   Endo/Heme/Allergies: Negative.   Psychiatric/Behavioral: The patient is nervous/anxious.     Assessment and Plan: Colum was seen today for follow-up.  Diagnoses and all orders for this visit:  Generalized anxiety disorder  Autism spectrum disorder  Diabetes mellitus due to underlying condition with hyperosmolarity without coma, without long-term current use of insulin (HCC)  Essential hypertension    Follow Up Instructions: After speaking to patient at length and Haywood Pao from [facility] services -care taker at the facility . I determine he needed a face to face visit . Questions arise that were unsettling with me about his living situation/condition, ability to care for his self and administer his medications   I discussed the assessment and treatment plan with the patient. The patient was provided an opportunity to ask questions and all were answered. The  patient agreed with the plan and demonstrated an understanding of the instructions.   The patient was advised to call back or seek an in-person evaluation if the symptoms worsen or if the condition fails to improve as anticipated.  I provided 60 minutes of non-face-to-face time during this encounter.   Grayce Sessions, NP

## 2018-09-09 NOTE — Telephone Encounter (Signed)
Call placed to [employer] transportation # [phone]  to schedule ride for patient to appointment next week at [facility].  Spoke to [place] and provided her with appointment time 09/15/2018 @ 1030 @ [facility]. Explained that patient is to be picked up at [facility] - [address],not at his home  . Sydell Axon said that they have the contact information for [facility] if they need to reach the patient and are unable to reach him on his cell phone.  They will call patient when they are on the way to the facility to pick him up.  Informed her that patient will be transported back to [facility] when his appointment at [facility] is completed.  She said that they use either [employer] or [employer] to schedule the rides.

## 2018-09-10 NOTE — Telephone Encounter (Signed)
1) Medication(s) Requested (by name): Pen for (ACCU-CHEK SOFT TOUCH)   2) Pharmacy of Choice: [facility] [address]  3) Special Requests: Its broken and too high to pay out of pocket   Approved medications will be sent to the pharmacy, we will reach out if there is an issue.  Requests made after 3pm may not be addressed until the following business day!  If a patient is unsure of the name of the medication(s) please note and ask patient to call back when they are able to provide all info, do not send to responsible party until all information is available! Pts grandmother called in

## 2018-09-14 MED ORDER — ACCU-CHEK FASTCLIX LANCET KIT: PACK | 0 refills | Status: DC

## 2018-09-14 MED ORDER — ACCU-CHEK FASTCLIX LANCETS MISC: 12 refills | Status: DC

## 2018-09-14 NOTE — Addendum Note (Signed)
Addended by: Lou Cal on: 09/14/2018 03:13 PM   Modules accepted: Orders

## 2018-09-15 ENCOUNTER — Telehealth: Payer: Self-pay

## 2018-09-15 ENCOUNTER — Ambulatory Visit: Payer: Medicaid Other | Admitting: Primary Care

## 2018-09-15 NOTE — Telephone Encounter (Signed)
Call received from Ms [facility]. .She explained that the patient had a death in his family and needed to cancel the appointment with PCP for today. His grandmother died and he was not at the center today.   She said that she is not sure who cancelled and rescheduled the appointment for him but he told her that he has an appointment is next Thursday - 09/23/2018 and he asked her to help with scheduling transportation Informed her that it is at  1010 and she said she will arrange DSS transportation for him.

## 2018-09-15 NOTE — Telephone Encounter (Signed)
Call placed to [facility] # [phone] to inquire if patient has transportation scheduled for appointment at [facility] on 09/23/2018 @ 1010. Message left with Marylene Land requesting a call back to this CM # [phone].

## 2018-09-16 ENCOUNTER — Ambulatory Visit: Payer: Medicaid Other | Admitting: Primary Care

## 2018-09-23 ENCOUNTER — Other Ambulatory Visit: Payer: Self-pay

## 2018-09-23 ENCOUNTER — Ambulatory Visit: Payer: Medicaid Other | Attending: Primary Care | Admitting: Primary Care

## 2018-09-23 ENCOUNTER — Telehealth: Payer: Self-pay

## 2018-09-23 DIAGNOSIS — Z79899 Other long term (current) drug therapy: Secondary | ICD-10-CM | POA: Diagnosis not present

## 2018-09-23 DIAGNOSIS — Z794 Long term (current) use of insulin: Secondary | ICD-10-CM | POA: Insufficient documentation

## 2018-09-23 DIAGNOSIS — I10 Essential (primary) hypertension: Secondary | ICD-10-CM | POA: Diagnosis not present

## 2018-09-23 DIAGNOSIS — F845 Asperger's syndrome: Secondary | ICD-10-CM | POA: Diagnosis not present

## 2018-09-23 DIAGNOSIS — F840 Autistic disorder: Secondary | ICD-10-CM

## 2018-09-23 DIAGNOSIS — F411 Generalized anxiety disorder: Secondary | ICD-10-CM | POA: Diagnosis not present

## 2018-09-23 DIAGNOSIS — E1100 Type 2 diabetes mellitus with hyperosmolarity without nonketotic hyperglycemic-hyperosmolar coma (NKHHC): Secondary | ICD-10-CM | POA: Insufficient documentation

## 2018-09-23 DIAGNOSIS — E0800 Diabetes mellitus due to underlying condition with hyperosmolarity without nonketotic hyperglycemic-hyperosmolar coma (NKHHC): Secondary | ICD-10-CM

## 2018-09-23 LAB — POCT GLYCOSYLATED HEMOGLOBIN (HGB A1C): Hemoglobin A1C: 5.6 % (ref 4.0–5.6)

## 2018-09-23 LAB — GLUCOSE, POCT (MANUAL RESULT ENTRY): POC Glucose: 86 mg/dl (ref 70–99)
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
## 2018-09-24 LAB — CBC WITH DIFFERENTIAL/PLATELET
Basophils Absolute: 0 10*3/uL (ref 0.0–0.2)
Basos: 1 %
EOS (ABSOLUTE): 0.1 10*3/uL (ref 0.0–0.4)
Eos: 2 %
Hematocrit: 47.2 % (ref 37.5–51.0)
Hemoglobin: 15.9 g/dL (ref 13.0–17.7)
Immature Grans (Abs): 0 10*3/uL (ref 0.0–0.1)
Immature Granulocytes: 0 %
Lymphocytes Absolute: 2.7 10*3/uL (ref 0.7–3.1)
Lymphs: 46 %
MCH: 29 pg (ref 26.6–33.0)
MCHC: 33.7 g/dL (ref 31.5–35.7)
MCV: 86 fL (ref 79–97)
Monocytes Absolute: 0.5 10*3/uL (ref 0.1–0.9)
Monocytes: 8 %
Neutrophils Absolute: 2.5 10*3/uL (ref 1.4–7.0)
Neutrophils: 43 %
Platelets: 334 10*3/uL (ref 150–450)
RBC: 5.49 x10E6/uL (ref 4.14–5.80)
RDW: 13.5 % (ref 11.6–15.4)
WBC: 5.7 10*3/uL (ref 3.4–10.8)

## 2018-09-24 LAB — CMP14+EGFR
ALT: 25 IU/L (ref 0–44)
AST: 28 IU/L (ref 0–40)
Albumin/Globulin Ratio: 1.7 (ref 1.2–2.2)
Albumin: 4.5 g/dL (ref 4.0–5.0)
Alkaline Phosphatase: 75 IU/L (ref 39–117)
BUN/Creatinine Ratio: 14 (ref 9–20)
BUN: 14 mg/dL (ref 6–20)
Bilirubin Total: 0.2 mg/dL (ref 0.0–1.2)
CO2: 23 mmol/L (ref 20–29)
Calcium: 9.6 mg/dL (ref 8.7–10.2)
Chloride: 102 mmol/L (ref 96–106)
Creatinine, Ser: 0.99 mg/dL (ref 0.76–1.27)
GFR calc Af Amer: 115 mL/min/{1.73_m2} (ref >59)
GFR calc non Af Amer: 100 mL/min/{1.73_m2} (ref >59)
Globulin, Total: 2.6 g/dL (ref 1.5–4.5)
Glucose: 71 mg/dL (ref 65–99)
Potassium: 4.5 mmol/L (ref 3.5–5.2)
Sodium: 140 mmol/L (ref 134–144)
Total Protein: 7.1 g/dL (ref 6.0–8.5)

## 2018-09-24 LAB — LIPID PANEL
Chol/HDL Ratio: 4 ratio (ref 0.0–5.0)
Cholesterol, Total: 172 mg/dL (ref 100–199)
HDL: 43 mg/dL (ref >39)
LDL Calculated: 101 mg/dL — ABNORMAL HIGH (ref 0–99)
Triglycerides: 142 mg/dL (ref 0–149)
VLDL Cholesterol Cal: 28 mg/dL (ref 5–40)

## 2018-09-24 LAB — HEMOGLOBIN A1C
Est. average glucose Bld gHb Est-mCnc: 120 mg/dL
Hgb A1c MFr Bld: 5.8 % — ABNORMAL HIGH (ref 4.8–5.6)

## 2018-09-26 ENCOUNTER — Encounter: Payer: Self-pay | Admitting: Primary Care

## 2018-09-27 NOTE — Progress Notes (Signed)
Are labs considered normal?

## 2019-03-19 IMAGING — DX DG CHEST 1V PORT
1 series · 1 of 1 positions shown · non-contrast
Comparison: None.

CLINICAL DATA: Fatigue and decreased appetite, found lying on the
floor.

EXAM:
PORTABLE CHEST 1 VIEW

[chest]
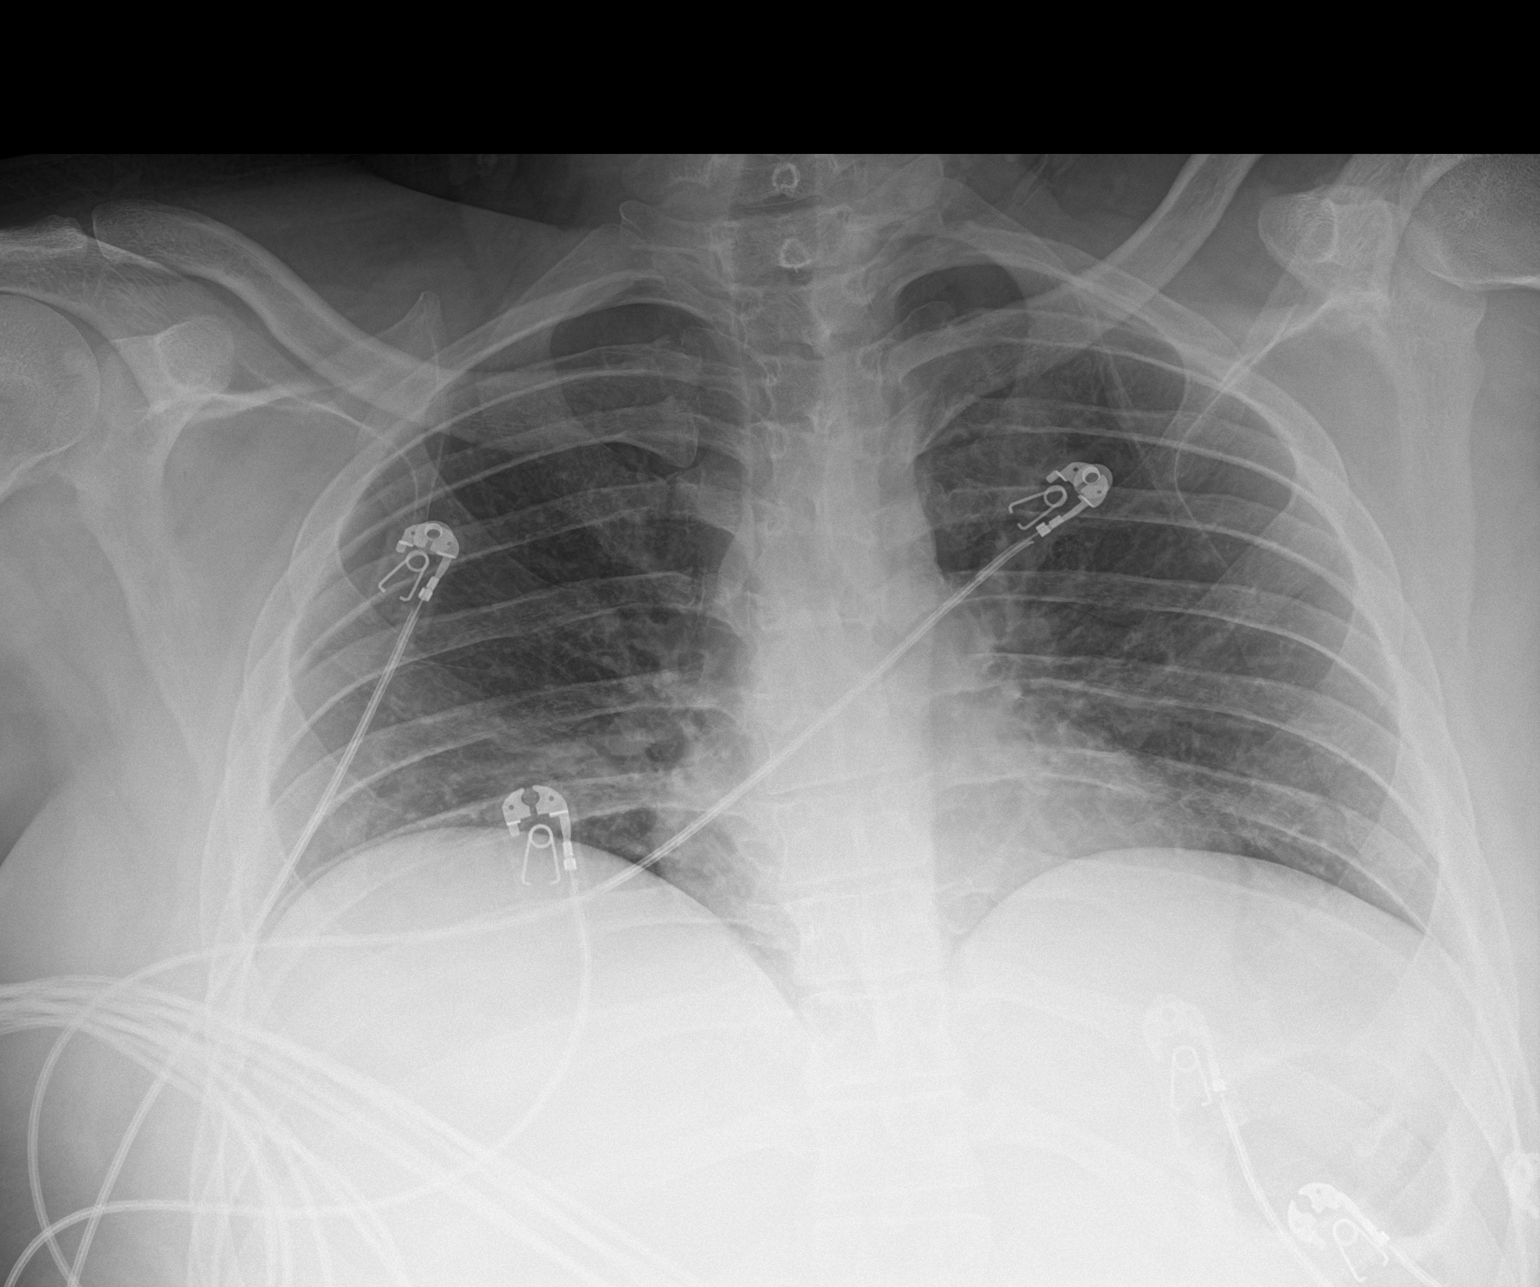

[1 of 1 positions shown; findings below may reference images not displayed]

FINDINGS: Trachea is midline. Heart size within normal limits. Lungs are low
in volume. No airspace consolidation or pleural fluid.
IMPRESSION: Low lung volumes.  No acute findings.

## 2019-03-19 IMAGING — CT CT CERVICAL SPINE W/O CM
4 of 8 series · 12 of 33 positions shown, 13 images · non-contrast
Comparison: None.

CLINICAL DATA: Fatigue and decreased appetite since last night.
Altered level of consciousness. Patient found down today.

EXAM:
CT HEAD WITHOUT CONTRAST
CT CERVICAL SPINE WITHOUT CONTRAST
TECHNIQUE: Multidetector CT imaging of the head and cervical spine was
performed following the standard protocol without intravenous
contrast. Multiplanar CT image reconstructions of the cervical spine
were also generated.

[Series 7: c_spine 2.0 st · axial · 0.36mm/px · z∈[-292,-178]mm · 3 of 115 slices shown, 4 images]
[im 29/115  soft-tissue]
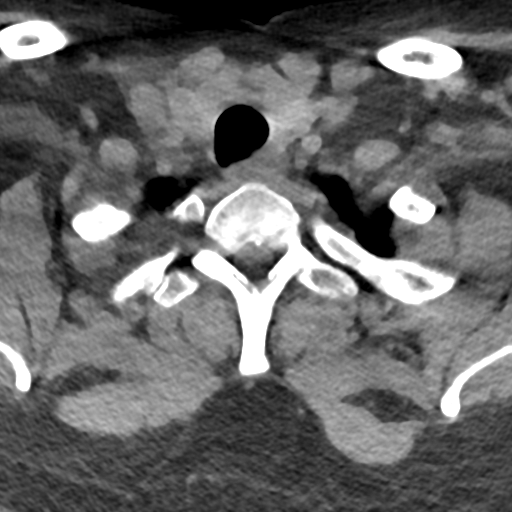
[im 29/115  bone]
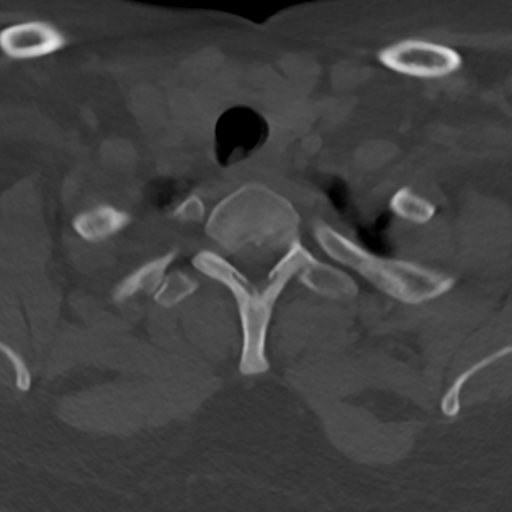
[im 58/115  bone]
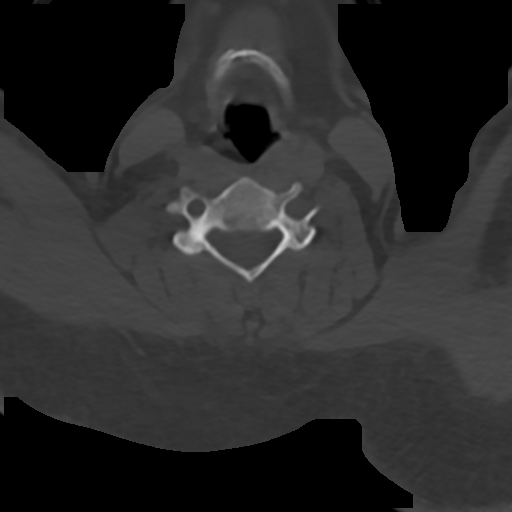
[im 86/115  bone]
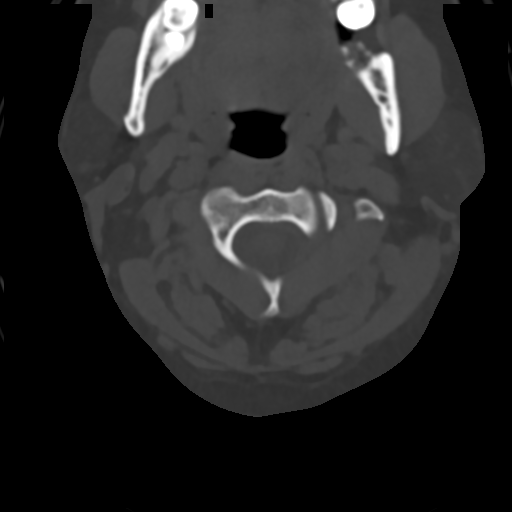

[Series 11: c_spine 2.0 sag bone · sagittal · 0.34mm/px · 5 of 61 slices shown]
[im 11/61  bone]
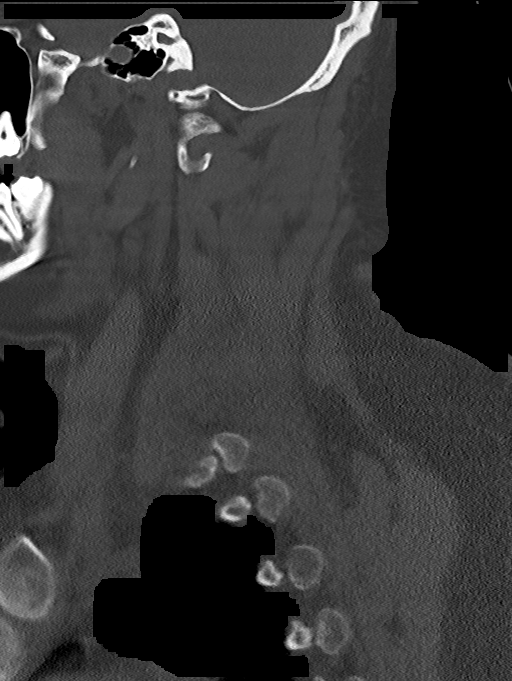
[im 21/61  bone]
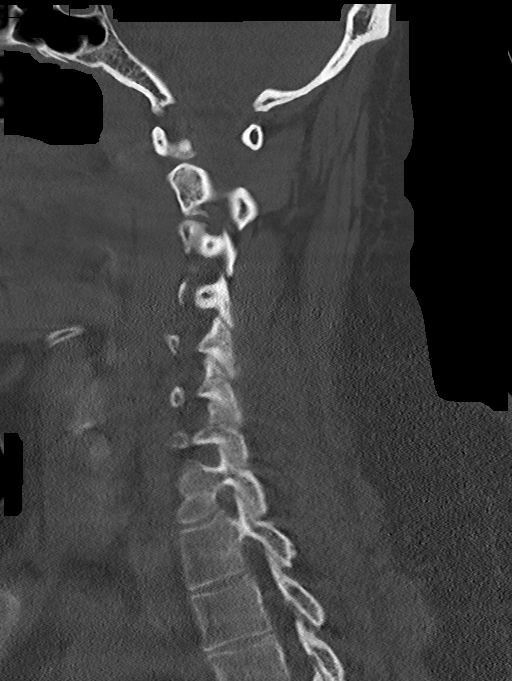
[im 31/61  bone]
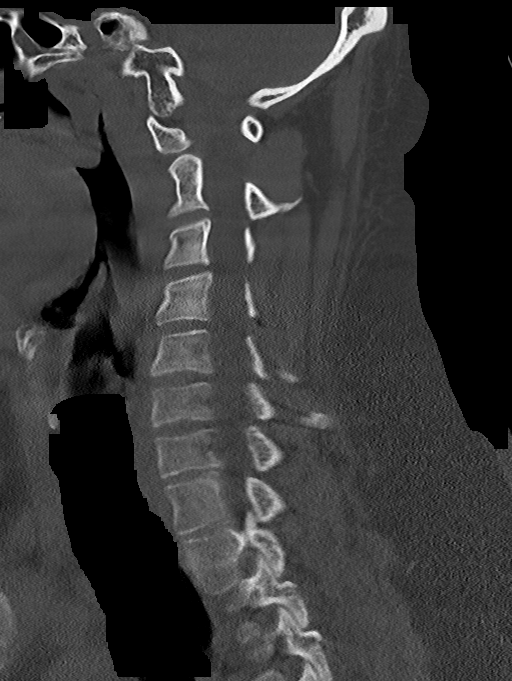
[im 41/61  bone]
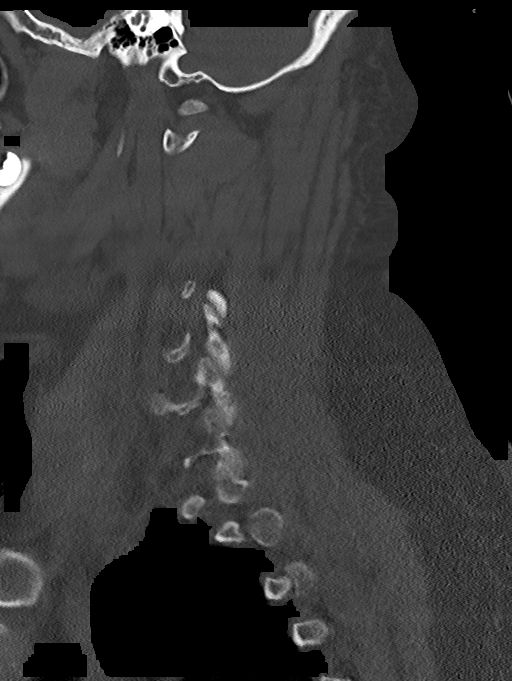
[im 51/61  bone]
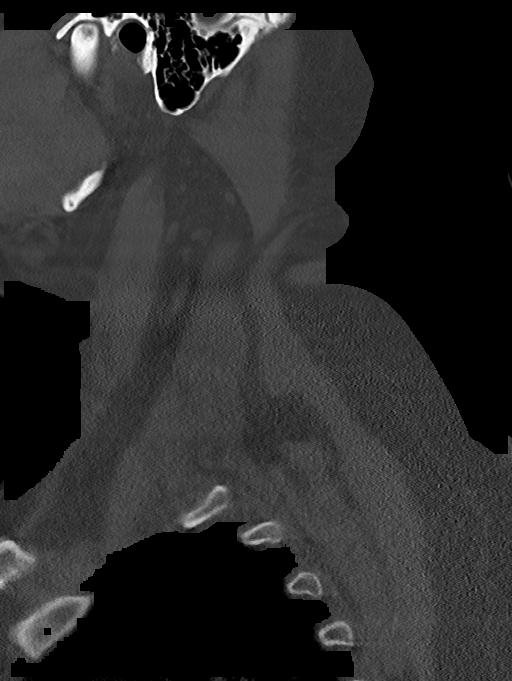

[Series 12: c_spine 2.0 cor bone · coronal · 0.34mm/px · 1 of 65 slices shown]
[im 33/65  bone]
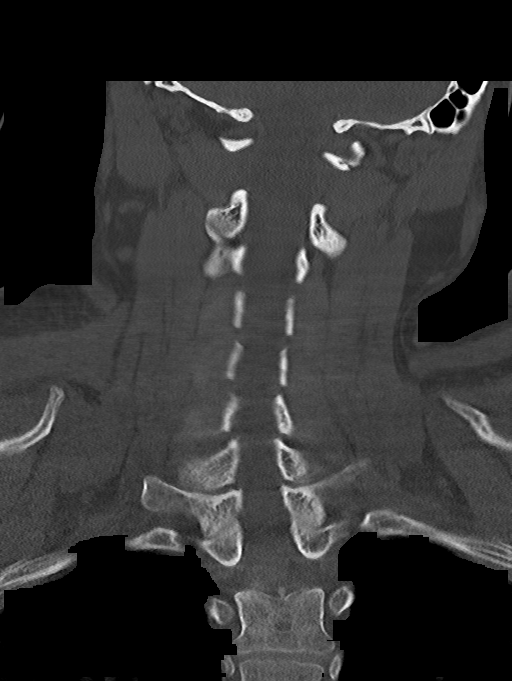

[Series 14: c_spine 2.0 orthogonals · axial · 0.21mm/px · z∈[-311,-194]mm · 3 of 110 slices shown]
[im 28/110  bone]
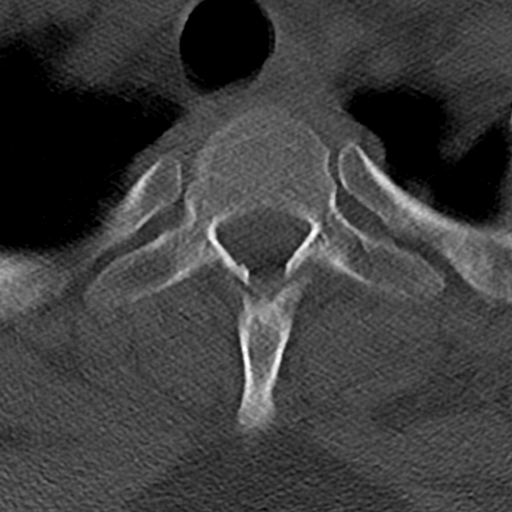
[im 55/110  bone]
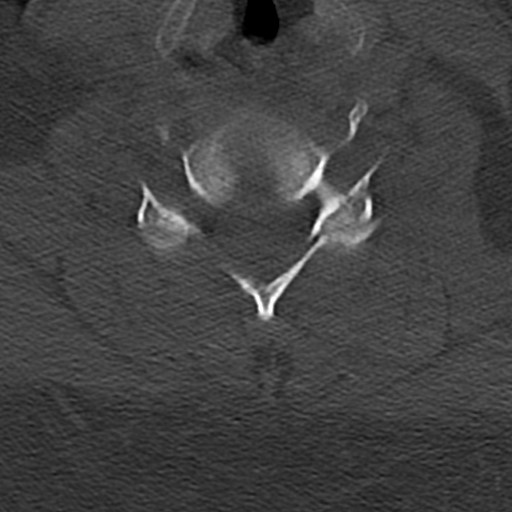
[im 82/110  bone]
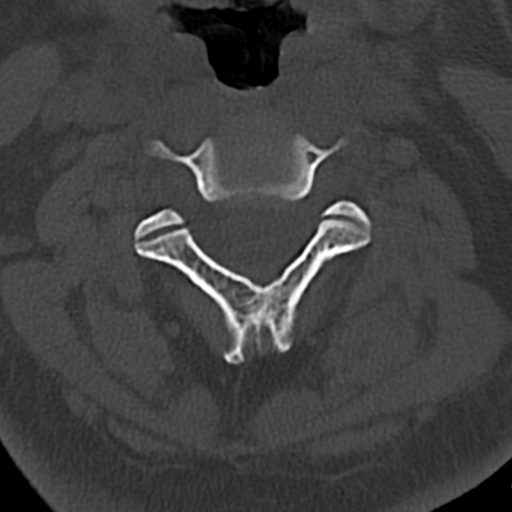

[12 of 33 positions shown; findings below may reference images not displayed]

FINDINGS: CT HEAD FINDINGS

Brain: Appears normal without hemorrhage, infarct, mass lesion, mass
effect, midline shift or abnormal extra-axial fluid collection. No
hydrocephalus or pneumocephalus.

Vascular: Negative.

Skull: Intact.

Sinuses/Orbits: Negative.

Other: None.

CT CERVICAL SPINE FINDINGS

Alignment: Normal.

Skull base and vertebrae: No acute fracture. No primary bone lesion
or focal pathologic process.

Soft tissues and spinal canal: No prevertebral fluid or swelling. No
visible canal hematoma.

Disc levels: Intervertebral disc space height is maintained. Mild
endplate spurring C5-6 noted.

Upper chest: Lung apices clear.

Other: None.
IMPRESSION: Negative head and cervical spine CT scans.

## 2019-03-20 IMAGING — DX DG CHEST 1V PORT
1 series · 1 of 1 positions shown · non-contrast
Comparison: 10/10/2016

CLINICAL DATA: Respiratory failure

EXAM:
PORTABLE CHEST 1 VIEW

[chest ap]
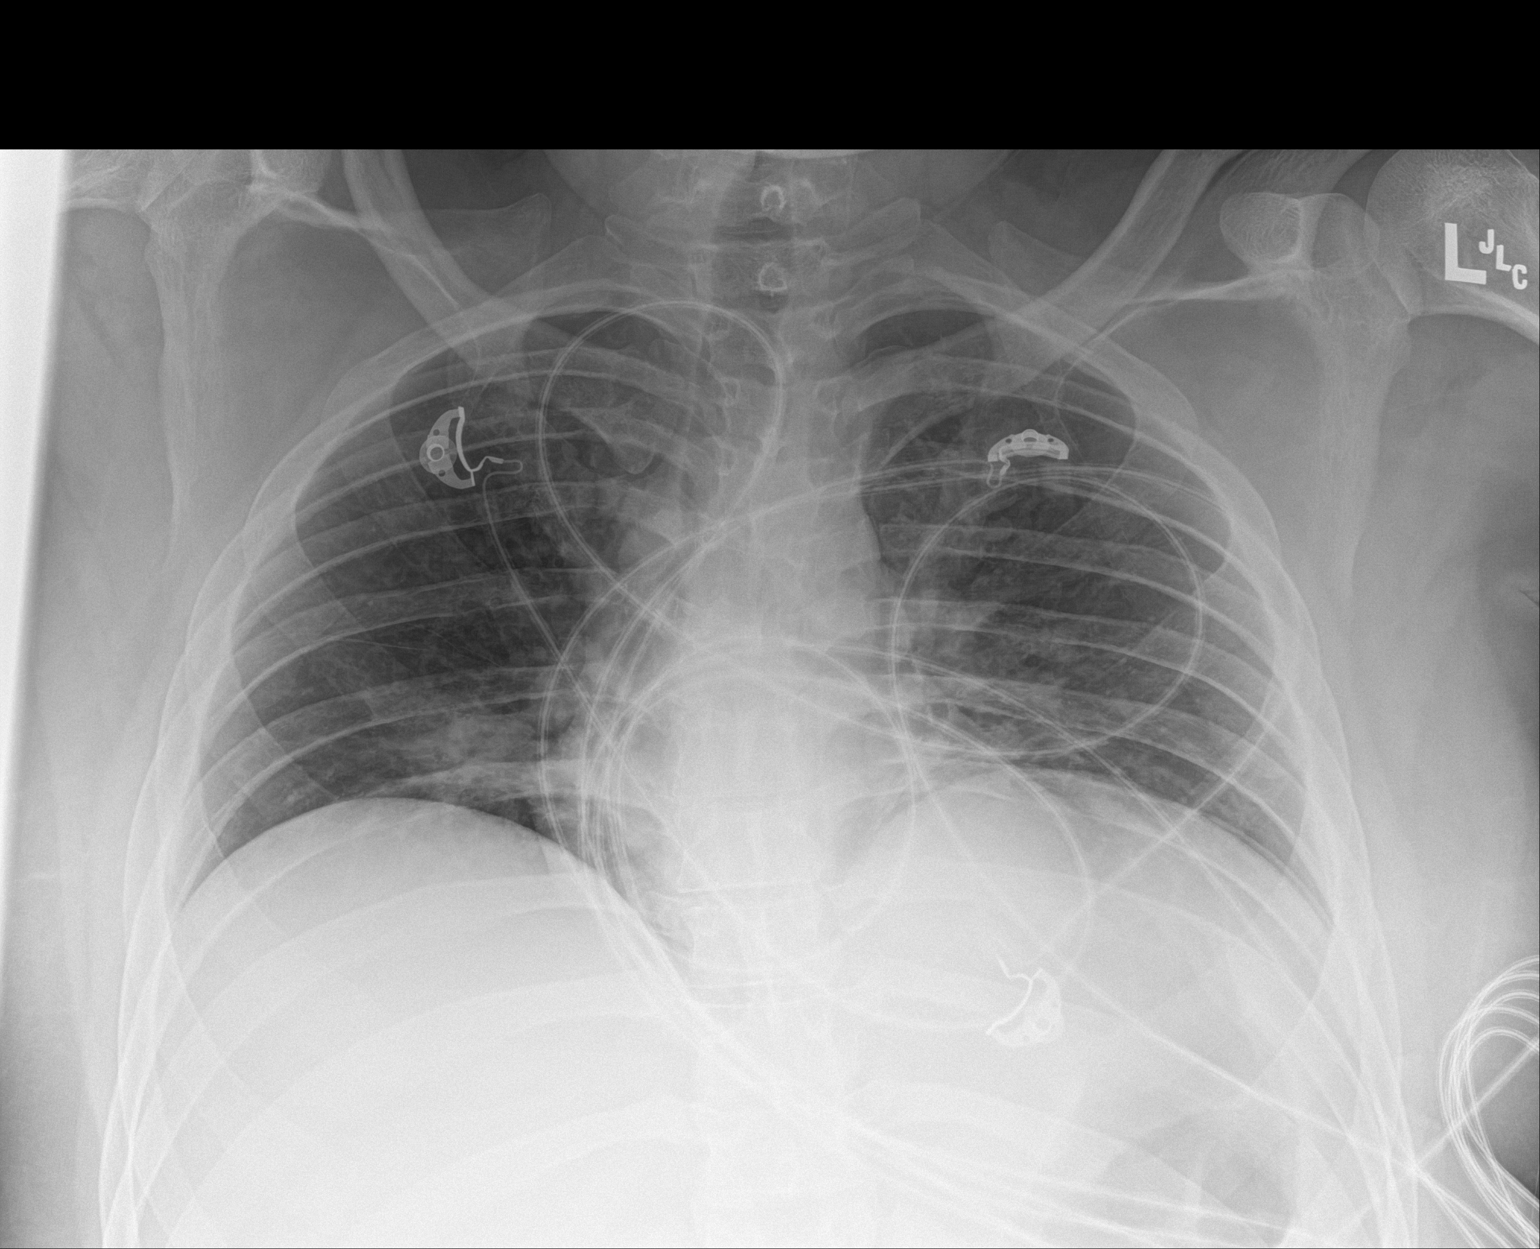

[1 of 1 positions shown; findings below may reference images not displayed]

FINDINGS: Low lung volumes with bibasilar atelectasis unchanged. Negative for
heart failure or effusion. Heart size normal.
IMPRESSION: Bibasilar atelectasis unchanged.

## 2019-04-01 ENCOUNTER — Telehealth (INDEPENDENT_AMBULATORY_CARE_PROVIDER_SITE_OTHER): Payer: Self-pay

## 2019-04-01 NOTE — Telephone Encounter (Signed)
Left voicemail asking patient to call [facility] to schedule appointment for medication management and diabetes. Request denied. Nat Christen, CMA

## 2019-04-01 NOTE — Telephone Encounter (Signed)
Patient called to make a medication refill for metFORMIN (GLUCOPHAGE XR) 500 MG 24 hr tablet     Patient uses [facility] on [address]   Please advice [phone]

## 2019-04-07 ENCOUNTER — Telehealth (INDEPENDENT_AMBULATORY_CARE_PROVIDER_SITE_OTHER): Payer: Self-pay

## 2019-04-07 NOTE — Telephone Encounter (Signed)
Patient called to make a medication refill for Accu-Chek meter states it no longer turns on.  Patient uses  [facility], [place]   Please advice [phone] Myrene Buddy (grandmother)

## 2019-04-08 NOTE — Telephone Encounter (Signed)
Patient and grandmother have both been informed that patient needs to present to appointment first before any prescriptions can be sent.

## 2019-04-14 ENCOUNTER — Ambulatory Visit (INDEPENDENT_AMBULATORY_CARE_PROVIDER_SITE_OTHER): Payer: Medicaid Other | Admitting: Primary Care

## 2019-04-14 ENCOUNTER — Other Ambulatory Visit: Payer: Self-pay

## 2019-04-14 ENCOUNTER — Encounter (INDEPENDENT_AMBULATORY_CARE_PROVIDER_SITE_OTHER): Payer: Self-pay | Admitting: Primary Care

## 2019-04-14 VITALS — BP 129/78 | HR 85 | Temp 97.1°F | Ht 70.0 in | Wt 216.6 lb

## 2019-04-14 DIAGNOSIS — I10 Essential (primary) hypertension: Secondary | ICD-10-CM | POA: Diagnosis not present

## 2019-04-14 DIAGNOSIS — Z6831 Body mass index (BMI) 31.0-31.9, adult: Secondary | ICD-10-CM

## 2019-04-14 DIAGNOSIS — Z794 Long term (current) use of insulin: Secondary | ICD-10-CM | POA: Diagnosis not present

## 2019-04-14 DIAGNOSIS — E119 Type 2 diabetes mellitus without complications: Secondary | ICD-10-CM

## 2019-04-14 DIAGNOSIS — E6609 Other obesity due to excess calories: Secondary | ICD-10-CM

## 2019-04-14 DIAGNOSIS — H6123 Impacted cerumen, bilateral: Secondary | ICD-10-CM

## 2019-04-14 LAB — POCT GLYCOSYLATED HEMOGLOBIN (HGB A1C): Hemoglobin A1C: 5.1 % (ref 4.0–5.6)

## 2019-04-14 LAB — GLUCOSE, POCT (MANUAL RESULT ENTRY): POC Glucose: 99 mg/dl (ref 70–99)

## 2019-04-14 NOTE — Patient Instructions (Signed)

## 2019-04-14 NOTE — Progress Notes (Signed)
Established Patient Office Visit  Subjective:  Patient ID: Jacob Price, male    DOB: 1985-05-18  Age: 33 y.o. MRN: [id number]  CC:  Chief Complaint  Patient presents with  . Diabetes  . Medication Refill    HPI Jacob Price presents for management of diabetes. A1C is 5.1 [name] continues to participate in  [place] - he has group therapy and taught how to maintain functions of daily living.  Raffaele is autistic but highly functional able. He is out of metformin will not refill. He will manage blood sugar with diet and exercise.  Past Medical History:  Diagnosis Date  . Asperger's syndrome   . Diabetes mellitus without complication ([place])   . Hypertension     No past surgical history on file.  No family history on file.  Social History   Socioeconomic History  . Marital status: Single    Spouse name: Not on file  . Number of children: Not on file  . Years of education: Not on file  . Highest education level: Not on file  Occupational History  . Not on file  Tobacco Use  . Smoking status: Never Smoker  . Smokeless tobacco: Never Used  Substance and Sexual Activity  . Alcohol use: No  . Drug use: No  . Sexual activity: Not Currently  Other Topics Concern  . Not on file  Social History Narrative   ** Merged History Encounter **       Social Determinants of Health   Financial Resource Strain:   . Difficulty of Paying Living Expenses: Not on file  Food Insecurity:   . Worried About Charity fundraiser in the Last Year: Not on file  . Ran Out of Food in the Last Year: Not on file  Transportation Needs:   . Lack of Transportation (Medical): Not on file  . Lack of Transportation (Non-Medical): Not on file  Physical Activity:   . Days of Exercise per Week: Not on file  . Minutes of Exercise per Session: Not on file  Stress:   . Feeling of Stress : Not on file  Social Connections:   . Frequency of Communication with Friends and Family: Not on  file  . Frequency of Social Gatherings with Friends and Family: Not on file  . Attends Religious Services: Not on file  . Active Member of Clubs or Organizations: Not on file  . Attends Archivist Meetings: Not on file  . Marital Status: Not on file  Intimate Partner Violence:   . Fear of Current or Ex-Partner: Not on file  . Emotionally Abused: Not on file  . Physically Abused: Not on file  . Sexually Abused: Not on file    Outpatient Medications Prior to Visit  Medication Sig Dispense Refill  . Accu-Chek FastClix Lancets MISC Use as directed three times daily 102 each 12  . benztropine (COGENTIN) 0.5 MG tablet Take 0.5 mg by mouth 2 (two) times daily.    . Blood Glucose Monitoring Suppl (ACCU-CHEK AVIVA PLUS) w/Device KIT See admin instructions.  1  . cholecalciferol (VITAMIN D) 400 units TABS tablet Take 400 Units by mouth 2 (two) times daily.    . ferrous sulfate 325 (65 FE) MG tablet Take 1 tablet (325 mg total) by mouth daily with breakfast. 30 tablet 1  . glucose blood (ACCU-CHEK AVIVA PLUS) test strip Use TID 100 each 11  . hydrochlorothiazide (HYDRODIURIL) 25 MG tablet Take 1 tablet (25 mg total) by  mouth daily. (Patient not taking: Reported on 04/14/2019) 90 tablet 3  . Insulin Pen Needle 29G X 12.7MM MISC Insulin administration 5  times daily 150 each 11  . Lancets Misc. (ACCU-CHEK FASTCLIX LANCET) KIT Use as directed three times daily 1 kit 0  . metFORMIN (GLUCOPHAGE XR) 500 MG 24 hr tablet Take 1 tablet (500 mg total) by mouth daily with breakfast. (Patient not taking: Reported on 04/14/2019) 90 tablet 3  . risperiDONE (RISPERDAL) 2 MG tablet Take 2 mg by mouth 3 (three) times daily.    . risperiDONE microspheres (RISPERDAL CONSTA) 25 MG injection Inject 25 mg into the muscle every 14 (fourteen) days.    . Cholecalciferol (VITAMIN D) 2000 units tablet Take 1 tablet (2,000 Units total) by mouth daily. 90 tablet 1   No facility-administered medications prior to  visit.    No Known Allergies  ROS Review of Systems  All other systems reviewed and are negative.     Objective:    Physical Exam  Constitutional: He is oriented to person, place, and time. He appears well-developed and well-nourished.  HENT:  Head: Normocephalic.  Bilateral cerumen impaction  Eyes: Pupils are equal, round, and reactive to light. EOM are normal.  Cardiovascular: Normal rate and regular rhythm.  Pulmonary/Chest: Effort normal and breath sounds normal.  Abdominal: Bowel sounds are normal.  Musculoskeletal:        General: Normal range of motion.     Cervical back: Normal range of motion and neck supple.  Neurological: He is oriented to person, place, and time.  Skin: Skin is warm and dry.  Psychiatric: He has a normal mood and affect.    BP 129/78 (BP Location: Left Arm, Patient Position: Sitting, Cuff Size: Normal)   Pulse 85   Temp (!) 97.1 F (36.2 C) (Temporal)   Ht '5\' 10"'  (1.778 m)   Wt 216 lb 9.6 oz (98.2 kg)   SpO2 95%   BMI 31.08 kg/m  Wt Readings from Last 3 Encounters:  04/14/19 216 lb 9.6 oz (98.2 kg)  11/12/17 261 lb (118.4 kg)  01/29/17 247 lb 9.6 oz (112.3 kg)     Health Maintenance Due  Topic Date Due  . OPHTHALMOLOGY EXAM  01/27/1995  . URINE MICROALBUMIN  01/29/2018    There are no preventive care reminders to display for this patient.  Lab Results  Component Value Date   TSH 1.070 11/03/2016   Lab Results  Component Value Date   WBC 5.7 09/23/2018   HGB 15.9 09/23/2018   HCT 47.2 09/23/2018   MCV 86 09/23/2018   PLT 334 09/23/2018   Lab Results  Component Value Date   NA 140 09/23/2018   K 4.5 09/23/2018   CO2 23 09/23/2018   GLUCOSE 71 09/23/2018   BUN 14 09/23/2018   CREATININE 0.99 09/23/2018   BILITOT 0.2 09/23/2018   ALKPHOS 75 09/23/2018   AST 28 09/23/2018   ALT 25 09/23/2018   PROT 7.1 09/23/2018   ALBUMIN 4.5 09/23/2018   CALCIUM 9.6 09/23/2018   ANIONGAP 8 01/12/2017   Lab Results  Component  Value Date   CHOL 172 09/23/2018   Lab Results  Component Value Date   HDL 43 09/23/2018   Lab Results  Component Value Date   LDLCALC 101 (H) 09/23/2018   Lab Results  Component Value Date   TRIG 142 09/23/2018   Lab Results  Component Value Date   CHOLHDL 4.0 09/23/2018   Lab Results  Component Value  Date   HGBA1C 5.1 04/14/2019      Assessment & Plan:  Zephyr was seen today for diabetes and medication refill.  Diagnoses and all orders for this visit:  Type 2 diabetes mellitus without complication, with long-term current use of insulin ([facility]) Well controlled discussed with him and grand mother medication reduction discontinue metformin. Continue to manage diabetes with diet and exercise. -     HgB A1c 5.1  -     Glucose (CBG) -     Ambulatory referral to Ophthalmology  Hypertension, unspecified type Controlled on HCTZ 90m daily Bp today 129/78 Bp goal is </= 130/80 . Continue to monitor sodium in diet , medication compliance, 150 minutes of moderate intensity exercise per week. Discussed medication compliance, adverse effects.  Class 1 obesity due to excess calories without serious comorbidity with body mass index (BMI) of 31.0 to 31.9 in adult Obesity is 30-39 indicating an excess in caloric intake or underlining conditions. This may lead to other co-morbidities uncontrolled diabetes, CVD , hypoxia , . Lifestyle modifications of diet and exercise may reduce obesity.   Cerumen debris on tympanic membrane of both ears Ear irrigation completed    Problem List Items Addressed This Visit    None    Visit Diagnoses    Type 2 diabetes mellitus without complication, with long-term current use of insulin (HCC)    -  Primary   Relevant Orders   HgB A1c (Completed)   Glucose (CBG) (Completed)   Ambulatory referral to Ophthalmology   Hypertension, unspecified type       Class 1 obesity due to excess calories without serious comorbidity with body mass index (BMI) of 31.0  to 31.9 in adult       Cerumen debris on tympanic membrane of both ears          No orders of the defined types were placed in this encounter.   Follow-up: Return if symptoms worsen or fail to improve.    MKerin Perna NP

## 2019-04-14 NOTE — Progress Notes (Signed)
Pt needs Rx for glucometer, current one is broken.

## 2019-04-18 ENCOUNTER — Encounter (INDEPENDENT_AMBULATORY_CARE_PROVIDER_SITE_OTHER): Payer: Self-pay | Admitting: Primary Care

## 2019-06-10 ENCOUNTER — Encounter: Payer: Self-pay | Admitting: Primary Care

## 2019-06-10 LAB — HM DIABETES EYE EXAM

## 2019-08-29 ENCOUNTER — Telehealth (INDEPENDENT_AMBULATORY_CARE_PROVIDER_SITE_OTHER): Payer: Self-pay | Admitting: Primary Care

## 2019-08-29 NOTE — Telephone Encounter (Signed)
Pt's grandmother call  Requesting  A glucometer that don't need stick the finger if the insurance don't cover they will try to pay for it  . Thanks

## 2019-08-30 NOTE — Telephone Encounter (Signed)
Patients grandmother is aware that insurance does not cover the glucometer she is requesting. States patients CBGs have been elevated. Scheduled an appointment for patient to be seen on 5/27.

## 2019-09-15 ENCOUNTER — Ambulatory Visit (INDEPENDENT_AMBULATORY_CARE_PROVIDER_SITE_OTHER): Payer: Medicaid Other | Admitting: Primary Care

## 2019-09-15 ENCOUNTER — Other Ambulatory Visit: Payer: Self-pay

## 2019-09-15 ENCOUNTER — Encounter (INDEPENDENT_AMBULATORY_CARE_PROVIDER_SITE_OTHER): Payer: Self-pay | Admitting: Primary Care

## 2019-09-15 VITALS — BP 138/90 | HR 70 | Temp 97.1°F | Ht 70.0 in | Wt 220.0 lb

## 2019-09-15 DIAGNOSIS — I10 Essential (primary) hypertension: Secondary | ICD-10-CM

## 2019-09-15 DIAGNOSIS — E119 Type 2 diabetes mellitus without complications: Secondary | ICD-10-CM | POA: Diagnosis not present

## 2019-09-15 DIAGNOSIS — Z794 Long term (current) use of insulin: Secondary | ICD-10-CM | POA: Diagnosis not present

## 2019-09-15 LAB — POCT CBG (FASTING - GLUCOSE)-MANUAL ENTRY: Glucose Fasting, POC: 80 mg/dL (ref 70–99)

## 2019-09-15 LAB — POCT GLYCOSYLATED HEMOGLOBIN (HGB A1C): Hemoglobin A1C: 5.3 % (ref 4.0–5.6)

## 2019-09-15 MED ORDER — HYDROCHLOROTHIAZIDE 12.5 MG PO CAPS: 12.5000 mg | ORAL_CAPSULE | Freq: Every day | ORAL | 1 refills | Status: DC

## 2019-09-15 NOTE — Progress Notes (Signed)
Established Patient Office Visit  Subjective:  Patient ID: Jacob [facility], male    DOB: February 11, 1986  Age: 34 y.o. MRN: [id number]  CC:  Chief Complaint  Patient presents with  . Diabetes    HPI Jacob Price presents for check up per grandmothers request. He denies any problems or concerns. A1C today was 5.3 . He does have elevated blood pressure . Denies shortness of breath, headaches, chest pain or lower extremity edema, sudden onset, vision changes, unilateral weakness, dizziness, paresthesias  Past Medical History:  Diagnosis Date  . Asperger's syndrome   . Diabetes mellitus without complication ([place])   . Hypertension     History reviewed. No pertinent surgical history.  History reviewed. No pertinent family history.  Social History   Socioeconomic History  . Marital status: Single    Spouse name: Not on file  . Number of children: Not on file  . Years of education: Not on file  . Highest education level: Not on file  Occupational History  . Not on file  Tobacco Use  . Smoking status: Never Smoker  . Smokeless tobacco: Never Used  Substance and Sexual Activity  . Alcohol use: No  . Drug use: No  . Sexual activity: Not Currently  Other Topics Concern  . Not on file  Social History Narrative   ** Merged History Encounter **       Social Determinants of Health   Financial Resource Strain:   . Difficulty of Paying Living Expenses:   Food Insecurity:   . Worried About Charity fundraiser in the Last Year:   . Arboriculturist in the Last Year:   Transportation Needs:   . Film/video editor (Medical):   Marland Kitchen Lack of Transportation (Non-Medical):   Physical Activity:   . Days of Exercise per Week:   . Minutes of Exercise per Session:   Stress:   . Feeling of Stress :   Social Connections:   . Frequency of Communication with Friends and Family:   . Frequency of Social Gatherings with Friends and Family:   . Attends Religious Services:    . Active Member of Clubs or Organizations:   . Attends Archivist Meetings:   Marland Kitchen Marital Status:   Intimate Partner Violence:   . Fear of Current or Ex-Partner:   . Emotionally Abused:   Marland Kitchen Physically Abused:   . Sexually Abused:     Outpatient Medications Prior to Visit  Medication Sig Dispense Refill  . benztropine (COGENTIN) 0.5 MG tablet Take 0.5 mg by mouth 2 (two) times daily.    . cholecalciferol (VITAMIN D) 400 units TABS tablet Take 400 Units by mouth 2 (two) times daily.    . risperiDONE (RISPERDAL) 2 MG tablet Take 2 mg by mouth 3 (three) times daily.    . Accu-Chek FastClix Lancets MISC Use as directed three times daily (Patient not taking: Reported on 09/15/2019) 102 each 12  . Blood Glucose Monitoring Suppl (ACCU-CHEK AVIVA PLUS) w/Device KIT See admin instructions.  1  . glucose blood (ACCU-CHEK AVIVA PLUS) test strip Use TID 100 each 11  . Insulin Pen Needle 29G X 12.7MM MISC Insulin administration 5  times daily (Patient not taking: Reported on 09/15/2019) 150 each 11  . Lancets Misc. (ACCU-CHEK FASTCLIX LANCET) KIT Use as directed three times daily (Patient not taking: Reported on 09/15/2019) 1 kit 0  . risperiDONE microspheres (RISPERDAL CONSTA) 25 MG injection Inject 25 mg into the muscle  every 14 (fourteen) days.    . ferrous sulfate 325 (65 FE) MG tablet Take 1 tablet (325 mg total) by mouth daily with breakfast. 30 tablet 1  . hydrochlorothiazide (HYDRODIURIL) 25 MG tablet Take 1 tablet (25 mg total) by mouth daily. (Patient not taking: Reported on 04/14/2019) 90 tablet 3  . metFORMIN (GLUCOPHAGE XR) 500 MG 24 hr tablet Take 1 tablet (500 mg total) by mouth daily with breakfast. (Patient not taking: Reported on 04/14/2019) 90 tablet 3   No facility-administered medications prior to visit.    No Known Allergies  ROS Review of Systems  All other systems reviewed and are negative.     Objective:    Physical Exam Vitals:   09/15/19 0957  BP: 138/90   Pulse: 70  Temp: (!) 97.1 F (36.2 C)  TempSrc: Temporal  SpO2: 99%  Weight: 220 lb (99.8 kg)  Height: '5\' 10"'  (1.778 m)   General: Vital signs reviewed.  Patient is well-developed and well-nourished male in no acute distress and cooperative with exam.  Head: Normocephalic and atraumatic. Eyes: EOMI, conjunctivae normal, no scleral icterus.  Neck: Supple, trachea midline, normal ROM, no JVD, masses, thyromegaly, or carotid bruit present.  Cardiovascular: RRR, S1 normal, S2 normal, no murmurs, gallops, or rubs. Pulmonary/Chest: Clear to auscultation bilaterally, no wheezes, rales, or rhonchi. Abdominal: Soft, non-tender, non-distended, BS +, no masses, organomegaly, or guarding present.  Musculoskeletal: No joint deformities, erythema, or stiffness, ROM full and nontender. Extremities: No lower extremity edema bilaterally,  pulses symmetric and intact bilaterally. No cyanosis or clubbing. Neurological: A&O x3, Strength is normal and symmetric bilaterally, cranial nerve II-XII are grossly intact, no focal motor deficit, sensory intact to light touch bilaterally.  Skin: Warm, dry and intact. No rashes or erythema. Psychiatric: Normal mood and affect. speech and behavior is normal. Cognition and memory are normal.  BP 138/90 (BP Location: Right Arm, Patient Position: Sitting, Cuff Size: Normal)   Pulse 70   Temp (!) 97.1 F (36.2 C) (Temporal)   Ht '5\' 10"'  (1.778 m)   Wt 220 lb (99.8 kg)   SpO2 99%   BMI 31.57 kg/m  Wt Readings from Last 3 Encounters:  09/15/19 220 lb (99.8 kg)  04/14/19 216 lb 9.6 oz (98.2 kg)  11/12/17 261 lb (118.4 kg)     Health Maintenance Due  Topic Date Due  . COVID-19 Vaccine (1) Never done  . URINE MICROALBUMIN  01/29/2018    There are no preventive care reminders to display for this patient.  Lab Results  Component Value Date   TSH 1.070 11/03/2016   Lab Results  Component Value Date   WBC 5.7 09/23/2018   HGB 15.9 09/23/2018   HCT 47.2  09/23/2018   MCV 86 09/23/2018   PLT 334 09/23/2018   Lab Results  Component Value Date   NA 140 09/23/2018   K 4.5 09/23/2018   CO2 23 09/23/2018   GLUCOSE 71 09/23/2018   BUN 14 09/23/2018   CREATININE 0.99 09/23/2018   BILITOT 0.2 09/23/2018   ALKPHOS 75 09/23/2018   AST 28 09/23/2018   ALT 25 09/23/2018   PROT 7.1 09/23/2018   ALBUMIN 4.5 09/23/2018   CALCIUM 9.6 09/23/2018   ANIONGAP 8 01/12/2017   Lab Results  Component Value Date   CHOL 172 09/23/2018   Lab Results  Component Value Date   HDL 43 09/23/2018   Lab Results  Component Value Date   LDLCALC 101 (H) 09/23/2018   Lab Results  Component Value Date   TRIG 142 09/23/2018   Lab Results  Component Value Date   CHOLHDL 4.0 09/23/2018   Lab Results  Component Value Date   HGBA1C 5.3 09/15/2019      Assessment & Plan:  Jacob Price was seen today for diabetes.  Diagnoses and all orders for this visit:  Type 2 diabetes mellitus without complication, with long-term current use of insulin ([place]) Patient is not a diabetic per ADA guidelines  -     HgB A1c -     Glucose (CBG), Fasting  Hypertension, unspecified type Counseled on blood pressure goal of less than 130/80, low-sodium, DASH diet, medication compliance, 150 minutes of moderate intensity exercise per week. Starteed low dose HCTZ 12.50m take every morning  Meds ordered this encounter  Medications  . hydrochlorothiazide (MICROZIDE) 12.5 MG capsule    Sig: Take 1 capsule (12.5 mg total) by mouth daily.    Dispense:  90 capsule    Refill:  1    Follow-up: Return in about 6 months (around 03/17/2020) for Bp check and follow up.    MKerin Perna NP

## 2019-09-15 NOTE — Patient Instructions (Signed)

## 2019-09-22 ENCOUNTER — Ambulatory Visit (INDEPENDENT_AMBULATORY_CARE_PROVIDER_SITE_OTHER): Payer: Medicaid Other | Admitting: Primary Care

## 2019-10-21 ENCOUNTER — Telehealth (INDEPENDENT_AMBULATORY_CARE_PROVIDER_SITE_OTHER): Payer: Self-pay

## 2019-10-21 NOTE — Telephone Encounter (Signed)
Deena from [facility] called to request an order for a home health aide to help manage medication and to help with meal preps. Deena feels like it will benefit the patients and will help him stay on track. °  °Please advice Deena [phone] (Office) [phone] (Personal) °

## 2019-10-26 ENCOUNTER — Other Ambulatory Visit (INDEPENDENT_AMBULATORY_CARE_PROVIDER_SITE_OTHER): Payer: Self-pay | Admitting: Primary Care

## 2019-10-26 DIAGNOSIS — F840 Autistic disorder: Secondary | ICD-10-CM

## 2019-10-26 NOTE — Telephone Encounter (Signed)
Deena from [employer] called to request an order for a home health aide to help manage medication and to help with meal preps. Deena feels like it will benefit the patients and will help him stay on track.   Please advice Gae Dry [phone] (Office) [phone] (Personal)

## 2019-10-27 ENCOUNTER — Telehealth: Payer: Self-pay

## 2019-10-27 NOTE — Telephone Encounter (Signed)
Call placed to [employer]. She explained that the patient has been living on his own and had been quite independent but is now having difficulty managing his medications.  She is requesting help with medication administration as well as housekeeping, meal preparation. This CM explained that home health services would be very short term and would not provide daily assistance with medication administration. Home health aides are not allowed to administer medications but could remind the patient to take them.  PCS is a possible option but no guarantee that he would be approved for the services as he does not have much of a need for assistance with bathing/personal care/ambulation.    Deena requested that the patient's mother, Darreld Mclean, be contacted for more information. Informed her that this CM or Jasmine, LCSW would contact his mother.  Bubble packing medications may also help.

## 2019-10-28 NOTE — Telephone Encounter (Signed)
Call placed to patient's mother, Abelardo Diesel [phone]. LCSW informed Ms. Caddell about options to assist with providing support to patient while residing independently in the home. Ms. Freddrick March is in agreeable with referral to [facility]. She is unsure about bubble packing medications and requested a follow up call next week to further discuss. No additional concerns noted.

## 2019-11-03 ENCOUNTER — Telehealth: Payer: Self-pay

## 2019-11-03 NOTE — Telephone Encounter (Signed)
Call placed to patient's mother regarding PCS.  Explained to her that [facility] care will be calling to do an assessment of patient's needs and she was agreeable to having herself as the contact person.   Regarding medication bubble packing to assist with compliance, she is deferring to PCP for advise.  PCS referral faxed to [facility].  

## 2019-11-03 NOTE — Telephone Encounter (Signed)
Call placed to patient's mother regarding PCS.  Explained to her that [facility] care will be calling to do an assessment of patient's needs and she was agreeable to having herself as the contact person.   Regarding medication bubble packing to assist with compliance, she is deferring to PCP for advise.  PCS referral faxed to [facility].

## 2019-11-08 NOTE — Telephone Encounter (Signed)
Call placed to [facility], spoke to Presquille who said that the patient's assessment has been scheduled for 11/16/2019

## 2019-12-20 ENCOUNTER — Encounter (HOSPITAL_COMMUNITY): Payer: Self-pay | Admitting: Emergency Medicine

## 2019-12-20 ENCOUNTER — Emergency Department (HOSPITAL_COMMUNITY)
Admission: EM | Admit: 2019-12-20 | Discharge: 2019-12-21 | Disposition: A | Discharge status: Home / Self Care | Payer: Medicaid Other | Attending: Emergency Medicine | Admitting: Emergency Medicine

## 2019-12-20 ENCOUNTER — Other Ambulatory Visit: Payer: Self-pay

## 2019-12-20 DIAGNOSIS — Z79899 Other long term (current) drug therapy: Secondary | ICD-10-CM | POA: Diagnosis not present

## 2019-12-20 DIAGNOSIS — R42 Dizziness and giddiness: Secondary | ICD-10-CM | POA: Insufficient documentation

## 2019-12-20 DIAGNOSIS — R55 Syncope and collapse: Secondary | ICD-10-CM | POA: Insufficient documentation

## 2019-12-20 DIAGNOSIS — I10 Essential (primary) hypertension: Secondary | ICD-10-CM | POA: Diagnosis not present

## 2019-12-20 DIAGNOSIS — Z794 Long term (current) use of insulin: Secondary | ICD-10-CM | POA: Diagnosis not present

## 2019-12-20 DIAGNOSIS — F840 Autistic disorder: Secondary | ICD-10-CM | POA: Diagnosis not present

## 2019-12-20 DIAGNOSIS — E1110 Type 2 diabetes mellitus with ketoacidosis without coma: Secondary | ICD-10-CM | POA: Insufficient documentation

## 2019-12-20 LAB — BASIC METABOLIC PANEL
Anion gap: 6 (ref 5–15)
BUN: 12 mg/dL (ref 6–20)
CO2: 28 mmol/L (ref 22–32)
Calcium: 8.8 mg/dL — ABNORMAL LOW (ref 8.9–10.3)
Chloride: 106 mmol/L (ref 98–111)
Creatinine, Ser: 1.01 mg/dL (ref 0.61–1.24)
GFR calc Af Amer: >60 mL/min (ref >60)
GFR calc non Af Amer: >60 mL/min (ref >60)
Glucose, Bld: 96 mg/dL (ref 70–99)
Potassium: 3.7 mmol/L (ref 3.5–5.1)
Sodium: 140 mmol/L (ref 135–145)

## 2019-12-20 LAB — CBC
HCT: 42.6 % (ref 39.0–52.0)
Hemoglobin: 14.1 g/dL (ref 13.0–17.0)
MCH: 28.5 pg (ref 26.0–34.0)
MCHC: 33.1 g/dL (ref 30.0–36.0)
MCV: 86.2 fL (ref 80.0–100.0)
Platelets: 275 10*3/uL (ref 150–400)
RBC: 4.94 MIL/uL (ref 4.22–5.81)
RDW: 12.4 % (ref 11.5–15.5)
WBC: 6.5 10*3/uL (ref 4.0–10.5)
nRBC: 0 % (ref 0.0–0.2)

## 2019-12-20 NOTE — ED Triage Notes (Signed)
Patient arrives to ED via EMS with complaints of a syncopal episode while walking outside today. Pt states he was walking about a mile to [employer] and got overheated. Pt got dizzy and about passed out. No injury to patient. EMS gave pt 1L fluid.

## 2019-12-20 NOTE — ED Notes (Signed)
[facility]  MOTHER CALL WHEN DISCHARGE  [phone]

## 2019-12-21 NOTE — ED Provider Notes (Signed)
[facility] Provider Note   CSN: [id number] Arrival date & time: 12/20/19  1559     History Chief Complaint  Patient presents with  . Near Syncope    Jacob Price is a 34 y.o. male.  The history is provided by the patient.  Near Syncope This is a new problem. The problem has been gradually improving. Pertinent negatives include no chest pain, no abdominal pain, no headaches and no shortness of breath. The symptoms are aggravated by standing. The symptoms are relieved by rest.   Patient w/history of diabetes, hypertension, autism spectrum disorder presents with syncopal episode.  Patient reports he was outside in the heat walking to the [employer].  He reports he felt lightheaded and had a syncopal event.  He reports it was very brief and the employees of the store called his family for him.  He denies any traumatic injury from the episode.  Denies history of seizures.  No chest pain or shortness of breath.  No new medication or medication adjustments recently.   Denies any alcohol or drug use Past Medical History:  Diagnosis Date  . Asperger's syndrome   . Diabetes mellitus without complication ([place])   . Hypertension     Patient Active Problem List   Diagnosis Date Noted  . Respiratory failure ([place])   . DKA (diabetic ketoacidoses) ([place]) 10/10/2016  . Schizoaffective disorder ([place]) 09/20/2014  . Hallucinations 09/20/2014  . Generalized anxiety disorder 08/01/2014  . Autism spectrum disorder 07/29/2014    History reviewed. No pertinent surgical history.     History reviewed. No pertinent family history.  Social History   Tobacco Use  . Smoking status: Never Smoker  . Smokeless tobacco: Never Used  Substance Use Topics  . Alcohol use: No  . Drug use: No    Home Medications Prior to Admission medications   Medication Sig Start Date End Date Taking? Authorizing Provider  Accu-Chek FastClix Lancets MISC Use as  directed three times daily Patient not taking: Reported on 09/15/2019 09/14/18   Kerin Perna, NP  benztropine (COGENTIN) 0.5 MG tablet Take 0.5 mg by mouth 2 (two) times daily.    [provider]  Blood Glucose Monitoring Suppl (ACCU-CHEK AVIVA PLUS) w/Device KIT See admin instructions. 10/16/16   [provider]  cholecalciferol (VITAMIN D) 400 units TABS tablet Take 400 Units by mouth 2 (two) times daily.    [provider]  glucose blood (ACCU-CHEK AVIVA PLUS) test strip Use TID 11/12/17   Clent Demark, PA-C  hydrochlorothiazide (MICROZIDE) 12.5 MG capsule Take 1 capsule (12.5 mg total) by mouth daily. 09/15/19   Kerin Perna, NP  Insulin Pen Needle 29G X 12.7MM MISC Insulin administration 5  times daily Patient not taking: Reported on 09/15/2019 11/12/17   Clent Demark, PA-C  Lancets Misc. (ACCU-CHEK FASTCLIX LANCET) KIT Use as directed three times daily Patient not taking: Reported on 09/15/2019 09/14/18   Kerin Perna, NP  risperiDONE (RISPERDAL) 2 MG tablet Take 2 mg by mouth 3 (three) times daily. 09/28/14   [provider]  risperiDONE microspheres (RISPERDAL CONSTA) 25 MG injection Inject 25 mg into the muscle every 14 (fourteen) days.    [provider]    Allergies    Patient has no known allergies.  Review of Systems   Review of Systems  Constitutional: Negative for fever.  Respiratory: Negative for shortness of breath.   Cardiovascular: Positive for near-syncope. Negative for chest pain.  Gastrointestinal: Negative for abdominal pain.  Neurological: Positive for syncope. Negative for weakness and headaches.  All other systems reviewed and are negative.   Physical Exam Updated Vital Signs BP 133/89 (BP Location: Left Arm)   Pulse 70   Temp 97.9 F (36.6 C) (Oral)   Resp 18   Ht 1.778 m ('5\' 10"' )   Wt 99.8 kg   SpO2 100%   BMI 31.57 kg/m   Physical Exam  CONSTITUTIONAL: Well developed/well  nourished HEAD: Normocephalic/atraumatic, no signs of trauma EYES: EOMI/PERRL ENMT: Mucous membranes moist NECK: supple no meningeal signs SPINE/BACK:entire spine nontender CV: S1/S2 noted, no murmurs/rubs/gallops noted LUNGS: Lungs are clear to auscultation bilaterally, no apparent distress ABDOMEN: soft, nontender, no rebound or guarding, bowel sounds noted throughout abdomen GU:no cva tenderness NEURO: Pt is awake/alert/appropriate, moves all extremitiesx4.  No facial droop.  No arm or leg drift EXTREMITIES: pulses normal/equal, full ROM, no visible deformities SKIN: warm, color normal PSYCH: no abnormalities of mood noted, alert and oriented to situation  ED Results / Procedures / Treatments   Labs (all labs ordered are listed, but only abnormal results are displayed) Labs Reviewed  BASIC METABOLIC PANEL - Abnormal; Notable for the following components:      Result Value   Calcium 8.8 (*)    All other components within normal limits  CBC    EKG EKG Interpretation  Date/Time:  Tuesday December 20 2019 16:13:52 EDT Ventricular Rate:  78 PR Interval:  156 QRS Duration: 80 QT Interval:  362 QTC Calculation: 412 R Axis:   35 Text Interpretation: Normal sinus rhythm Nonspecific ST and T wave abnormality Abnormal ECG When compared with ECG of 10/10/2016, Artifact is no longer present Confirmed by Delora Fuel ([id number]) on 12/20/2019 11:39:24 PM   Radiology No results found.  Procedures Procedures   Medications Ordered in ED Medications - No data to display  ED Course  I have reviewed the triage vital signs and the nursing notes.  Pertinent labs  results that were available during my care of the patient were reviewed by me and considered in my medical decision making (see chart for details).    MDM Rules/Calculators/A&P                         3:44 AM Patient likely had mild heat illness that contribute to a syncopal episode.  He appears to be back to baseline.  He is  in no acute distress.  Vitals are appropriate.  He is not hyperthermic.  No acute EKG changes. 4:06 AM Patient stable in the ER.  No change in his orthostatic blood pressure measurements. Patient is stable for discharge home.  Low suspicion for cardiac dysrhythmia. Discussed the case with his mother via phone who is his guardian.  She will pick patient up   This patient presents to the ED for concern of syncope, this involves an extensive number of treatment options, and is a complaint that carries with it a high risk of complications and morbidity.  The differential diagnosis includes vasovagal syncope, cardiac dysrhythmia, dehydration, heat stroke   Lab Tests:   I Ordered, reviewed, and interpreted labs, which included electrolytes, complete blood count  Additional history obtained:    Previous records obtained and reviewed   Reevaluation:  After the interventions stated above, I reevaluated the patient and found patient is improved   Final Clinical Impression(s) / ED Diagnoses Final diagnoses:  Syncope and collapse    Rx /  DC Orders ED Discharge Orders    None       Ripley Fraise, MD 12/21/19 [phone]

## 2019-12-21 NOTE — ED Notes (Signed)
Discharge instructions discussed with pt. Pt verbalized understanding. Pt stable and ambulatory. No signature pad available. Dr. Bebe Shaggy discussed d/c papers w/ mother

## 2020-03-19 ENCOUNTER — Ambulatory Visit (INDEPENDENT_AMBULATORY_CARE_PROVIDER_SITE_OTHER): Payer: Medicaid Other | Admitting: Primary Care

## 2020-03-19 ENCOUNTER — Other Ambulatory Visit: Payer: Self-pay

## 2020-03-19 ENCOUNTER — Encounter (INDEPENDENT_AMBULATORY_CARE_PROVIDER_SITE_OTHER): Payer: Self-pay | Admitting: Primary Care

## 2020-03-19 VITALS — BP 130/91 | HR 103 | Temp 97.3°F | Ht 70.0 in | Wt 246.6 lb

## 2020-03-19 DIAGNOSIS — F259 Schizoaffective disorder, unspecified: Secondary | ICD-10-CM | POA: Diagnosis not present

## 2020-03-19 DIAGNOSIS — Z23 Encounter for immunization: Secondary | ICD-10-CM | POA: Diagnosis not present

## 2020-03-19 DIAGNOSIS — E119 Type 2 diabetes mellitus without complications: Secondary | ICD-10-CM | POA: Diagnosis not present

## 2020-03-19 DIAGNOSIS — I10 Essential (primary) hypertension: Secondary | ICD-10-CM

## 2020-03-19 LAB — POCT GLYCOSYLATED HEMOGLOBIN (HGB A1C): Hemoglobin A1C: 5.6 % (ref 4.0–5.6)

## 2020-03-19 LAB — GLUCOSE, POCT (MANUAL RESULT ENTRY): POC Glucose: 138 mg/dl — AB (ref 70–99)

## 2020-03-19 MED ORDER — HYDROCHLOROTHIAZIDE 25 MG PO TABS: 25.0000 mg | ORAL_TABLET | Freq: Every day | ORAL | 3 refills | Status: DC

## 2020-03-19 NOTE — Patient Instructions (Addendum)
Influenza, Adult Influenza is also called "the flu." It is an infection in the lungs, nose, and throat (respiratory tract). It is caused by a virus. The flu causes symptoms that are similar to symptoms of a cold. It also causes a high fever and body aches. The flu spreads easily from person to person (is contagious). Getting a flu shot (influenza vaccination) every year is the best way to prevent the flu. What are the causes? This condition is caused by the influenza virus. You can get the virus by:  Breathing in droplets that are in the air from the cough or sneeze of a person who has the virus.  Touching something that has the virus on it (is contaminated) and then touching your mouth, nose, or eyes. What increases the risk? Certain things may make you more likely to get the flu. These include:  Not washing your hands often.  Having close contact with many people during cold and flu season.  Touching your mouth, eyes, or nose without first washing your hands.  Not getting a flu shot every year. You may have a higher risk for the flu, along with serious problems such as a lung infection (pneumonia), if you:  Are older than 65.  Are pregnant.  Have a weakened disease-fighting system (immune system) because of a disease or taking certain medicines.  Have a long-term (chronic) illness, such as: ? Heart, kidney, or lung disease. ? Diabetes. ? Asthma.  Have a liver disorder.  Are very overweight (morbidly obese).  Have anemia. This is a condition that affects your red blood cells. What are the signs or symptoms? Symptoms usually begin suddenly and last 4-14 days. They may include:  Fever and chills.  Headaches, body aches, or muscle aches.  Sore throat.  Cough.  Runny or stuffy (congested) nose.  Chest discomfort.  Not wanting to eat as much as normal (poor appetite).  Weakness or feeling tired (fatigue).  Dizziness.  Feeling sick to your stomach (nauseous) or  throwing up (vomiting). How is this treated? If the flu is found early, you can be treated with medicine that can help reduce how bad the illness is and how long it lasts (antiviral medicine). This may be given by mouth (orally) or through an IV tube. Taking care of yourself at home can help your symptoms get better. Your doctor may suggest:  Taking over-the-counter medicines.  Drinking plenty of fluids. The flu often goes away on its own. If you have very bad symptoms or other problems, you may be treated in a hospital. Follow these instructions at home:     Activity  Rest as needed. Get plenty of sleep.  Stay home from work or school as told by your doctor. ? Do not leave home until you do not have a fever for 24 hours without taking medicine. ? Leave home only to visit your doctor. Eating and drinking  Take an ORS (oral rehydration solution). This is a drink that is sold at pharmacies and stores.  Drink enough fluid to keep your pee (urine) pale yellow.  Drink clear fluids in small amounts as you are able. Clear fluids include: ? Water. ? Ice chips. ? Fruit juice that has water added (diluted fruit juice). ? Low-calorie sports drinks.  Eat bland, easy-to-digest foods in small amounts as you are able. These foods include: ? Bananas. ? Applesauce. ? Rice. ? Lean meats. ? Toast. ? Crackers.  Do not eat or drink: ? Fluids that have a lot   of sugar or caffeine. ? Alcohol. ? Spicy or fatty foods. General instructions  Take over-the-counter and prescription medicines only as told by your doctor.  Use a cool mist humidifier to add moisture to the air in your home. This can make it easier for you to breathe.  Cover your mouth and nose when you cough or sneeze.  Wash your hands with soap and water often, especially after you cough or sneeze. If you cannot use soap and water, use alcohol-based hand sanitizer.  Keep all follow-up visits as told by your doctor. This is  important. How is this prevented?   Get a flu shot every year. You may get the flu shot in late summer, fall, or winter. Ask your doctor when you should get your flu shot.  Avoid contact with people who are sick during fall and winter (cold and flu season). Contact a doctor if:  You get new symptoms.  You have: ? Chest pain. ? Watery poop (diarrhea). ? A fever.  Your cough gets worse.  You start to have more mucus.  You feel sick to your stomach.  You throw up. Get help right away if you:  Have shortness of breath.  Have trouble breathing.  Have skin or nails that turn a bluish color.  Have very bad pain or stiffness in your neck.  Get a sudden headache.  Get sudden pain in your face or ear.  Cannot eat or drink without throwing up. Summary  Influenza ("the flu") is an infection in the lungs, nose, and throat. It is caused by a virus.  Take over-the-counter and prescription medicines only as told by your doctor.  Getting a flu shot every year is the best way to avoid getting the flu. This information is not intended to replace advice given to you by your health care provider. Make sure you discuss any questions you have with your health care provider. Document Revised: 09/30/2017 Document Reviewed: 09/30/2017 Elsevier Patient Education  2020 [employer].  Hypertension, Adult Hypertension is another name for high blood pressure. High blood pressure forces your heart to work harder to pump blood. This can cause problems over time. There are two numbers in a blood pressure reading. There is a top number (systolic) over a bottom number (diastolic). It is best to have a blood pressure that is below 120/80. Healthy choices can help lower your blood pressure, or you may need medicine to help lower it. What are the causes? The cause of this condition is not known. Some conditions may be related to high blood pressure. What increases the risk?  Smoking.  Having type  2 diabetes mellitus, high cholesterol, or both.  Not getting enough exercise or physical activity.  Being overweight.  Having too much fat, sugar, calories, or salt (sodium) in your diet.  Drinking too much alcohol.  Having long-term (chronic) kidney disease.  Having a family history of high blood pressure.  Age. Risk increases with age.  Race. You may be at higher risk if you are African American.  Gender. Men are at higher risk than women before age 24. After age 35, women are at higher risk than men.  Having obstructive sleep apnea.  Stress. What are the signs or symptoms?  High blood pressure may not cause symptoms. Very high blood pressure (hypertensive crisis) may cause: ? Headache. ? Feelings of worry or nervousness (anxiety). ? Shortness of breath. ? Nosebleed. ? A feeling of being sick to your stomach (nausea). ? Throwing up (  vomiting). ? Changes in how you see. ? Very bad chest pain. ? Seizures. How is this treated?  This condition is treated by making healthy lifestyle changes, such as: ? Eating healthy foods. ? Exercising more. ? Drinking less alcohol.  Your health care provider may prescribe medicine if lifestyle changes are not enough to get your blood pressure under control, and if: ? Your top number is above 130. ? Your bottom number is above 80.  Your personal target blood pressure may vary. Follow these instructions at home: Eating and drinking   If told, follow the DASH eating plan. To follow this plan: ? Fill one half of your plate at each meal with fruits and vegetables. ? Fill one fourth of your plate at each meal with whole grains. Whole grains include whole-wheat pasta, brown rice, and whole-grain bread. ? Eat or drink low-fat dairy products, such as skim milk or low-fat yogurt. ? Fill one fourth of your plate at each meal with low-fat (lean) proteins. Low-fat proteins include fish, chicken without skin, eggs, beans, and tofu. ? Avoid  fatty meat, cured and processed meat, or chicken with skin. ? Avoid pre-made or processed food.  Eat less than 1,500 mg of salt each day.  Do not drink alcohol if: ? Your doctor tells you not to drink. ? You are pregnant, may be pregnant, or are planning to become pregnant.  If you drink alcohol: ? Limit how much you use to:  0-1 drink a day for women.  0-2 drinks a day for men. ? Be aware of how much alcohol is in your drink. In the [place] one drink equals one 12 oz bottle of beer (355 mL), one 5 oz glass of wine (148 mL), or one 1 oz glass of hard liquor (44 mL). Lifestyle   Work with your doctor to stay at a healthy weight or to lose weight. Ask your doctor what the best weight is for you.  Get at least 30 minutes of exercise most days of the week. This may include walking, swimming, or biking.  Get at least 30 minutes of exercise that strengthens your muscles (resistance exercise) at least 3 days a week. This may include lifting weights or doing Pilates.  Do not use any products that contain nicotine or tobacco, such as cigarettes, e-cigarettes, and chewing tobacco. If you need help quitting, ask your doctor.  Check your blood pressure at home as told by your doctor.  Keep all follow-up visits as told by your doctor. This is important. Medicines  Take over-the-counter and prescription medicines only as told by your doctor. Follow directions carefully.  Do not skip doses of blood pressure medicine. The medicine does not work as well if you skip doses. Skipping doses also puts you at risk for problems.  Ask your doctor about side effects or reactions to medicines that you should watch for. Contact a doctor if you:  Think you are having a reaction to the medicine you are taking.  Have headaches that keep coming back (recurring).  Feel dizzy.  Have swelling in your ankles.  Have trouble with your vision. Get help right away if you:  Get a very bad headache.  Start  to feel mixed up (confused).  Feel weak or numb.  Feel faint.  Have very bad pain in your: ? Chest. ? Belly (abdomen).  Throw up more than once.  Have trouble breathing. Summary  Hypertension is another name for high blood pressure.  High blood pressure  forces your heart to work harder to pump blood.  For most people, a normal blood pressure is less than 120/80.  Making healthy choices can help lower blood pressure. If your blood pressure does not get lower with healthy choices, you may need to take medicine. This information is not intended to replace advice given to you by your health care provider. Make sure you discuss any questions you have with your health care provider. Document Revised: 12/23/2017 Document Reviewed: 12/23/2017 Elsevier Patient Education  2020 [employer].

## 2020-03-19 NOTE — Progress Notes (Signed)
Established Patient Office Visit  Subjective:  Patient ID: Jacob Price, male    DOB: [birth year]  Age: 34 y.o. MRN: [id number]  CC:  Chief Complaint  Patient presents with  . Blood Pressure Check    HPI Jacob Price is a 34 year old obese male who presents for blood pressure follow-up currently on HCTZ 12.5 mg. Denies shortness of breath, headaches, chest pain or lower extremity edema.  He voices no other complaints or problems.  Will check A1c for pre diabetes previous A1c 6 months ago 5.3.  Denies polyuria, polydipsia and polyphagia. Presented in wet clothes he washed clothes and did not have time to allow than to dry. Concern - catching respiratory problems- cold/cough , pneumonia or bronchitis. Will contact Clinical nurse for more guidance with care.  Past Medical History:  Diagnosis Date  . Asperger's syndrome   . Diabetes mellitus without complication ([place])   . Hypertension     No past surgical history on file.  No family history on file.  Social History   Socioeconomic History  . Marital status: Single    Spouse name: Not on file  . Number of children: Not on file  . Years of education: Not on file  . Highest education level: Not on file  Occupational History  . Not on file  Tobacco Use  . Smoking status: Never Smoker  . Smokeless tobacco: Never Used  Substance and Sexual Activity  . Alcohol use: No  . Drug use: No  . Sexual activity: Not Currently  Other Topics Concern  . Not on file  Social History Narrative   ** Merged History Encounter **       Social Determinants of Health   Financial Resource Strain:   . Difficulty of Paying Living Expenses: Not on file  Food Insecurity:   . Worried About Charity fundraiser in the Last Year: Not on file  . Ran Out of Food in the Last Year: Not on file  Transportation Needs:   . Lack of Transportation (Medical): Not on file  . Lack of Transportation (Non-Medical): Not on file  Physical  Activity:   . Days of Exercise per Week: Not on file  . Minutes of Exercise per Session: Not on file  Stress:   . Feeling of Stress : Not on file  Social Connections:   . Frequency of Communication with Friends and Family: Not on file  . Frequency of Social Gatherings with Friends and Family: Not on file  . Attends Religious Services: Not on file  . Active Member of Clubs or Organizations: Not on file  . Attends Archivist Meetings: Not on file  . Marital Status: Not on file  Intimate Partner Violence:   . Fear of Current or Ex-Partner: Not on file  . Emotionally Abused: Not on file  . Physically Abused: Not on file  . Sexually Abused: Not on file    Outpatient Medications Prior to Visit  Medication Sig Dispense Refill  . benztropine (COGENTIN) 0.5 MG tablet Take 0.5 mg by mouth 2 (two) times daily.    . Blood Glucose Monitoring Suppl (ACCU-CHEK AVIVA PLUS) w/Device KIT See admin instructions.  1  . cholecalciferol (VITAMIN D) 400 units TABS tablet Take 400 Units by mouth 2 (two) times daily.    . risperiDONE (RISPERDAL) 2 MG tablet Take 2 mg by mouth 3 (three) times daily.    . risperiDONE microspheres (RISPERDAL CONSTA) 25 MG injection Inject 25 mg into  the muscle every 14 (fourteen) days.    . hydrochlorothiazide (MICROZIDE) 12.5 MG capsule Take 1 capsule (12.5 mg total) by mouth daily. 90 capsule 1  . Accu-Chek FastClix Lancets MISC Use as directed three times daily (Patient not taking: Reported on 09/15/2019) 102 each 12  . glucose blood (ACCU-CHEK AVIVA PLUS) test strip Use TID 100 each 11  . Insulin Pen Needle 29G X 12.7MM MISC Insulin administration 5  times daily (Patient not taking: Reported on 09/15/2019) 150 each 11  . Lancets Misc. (ACCU-CHEK FASTCLIX LANCET) KIT Use as directed three times daily (Patient not taking: Reported on 09/15/2019) 1 kit 0   No facility-administered medications prior to visit.    No Known Allergies  ROS Review of Systems  All other  systems reviewed and are negative.     Objective:    Physical Exam Vitals reviewed.  Constitutional:      Appearance: He is obese.  HENT:     Head: Normocephalic.     Nose: Nose normal.  Eyes:     Extraocular Movements: Extraocular movements intact.  Cardiovascular:     Rate and Rhythm: Normal rate and regular rhythm.  Pulmonary:     Effort: Pulmonary effort is normal.     Breath sounds: Normal breath sounds.  Abdominal:     General: Bowel sounds are normal. There is distension.     Palpations: Abdomen is soft.  Musculoskeletal:        General: Normal range of motion.     Cervical back: Normal range of motion and neck supple.  Neurological:     Mental Status: He is alert and oriented to person, place, and time.  Psychiatric:        Mood and Affect: Mood normal.        Behavior: Behavior normal.        Thought Content: Thought content normal.     BP (!) 130/91 (BP Location: Right Arm, Patient Position: Sitting, Cuff Size: Large)   Pulse (!) 103   Temp (!) 97.3 F (36.3 C) (Temporal)   Ht _0  (1.778 m)   Wt 246 lb 9.6 oz (111.9 kg)   SpO2 99%   BMI 35.38 kg/m  Wt Readings from Last 3 Encounters:  03/19/20 246 lb 9.6 oz (111.9 kg)  12/20/19 220 lb (99.8 kg)  09/15/19 220 lb (99.8 kg)     Health Maintenance Due  Topic Date Due  . COVID-19 Vaccine (1) Never done  . URINE MICROALBUMIN  01/29/2018  . INFLUENZA VACCINE  11/27/2019    There are no preventive care reminders to display for this patient.  Lab Results  Component Value Date   TSH 1.070 11/03/2016   Lab Results  Component Value Date   WBC 6.5 12/20/2019   HGB 14.1 12/20/2019   HCT 42.6 12/20/2019   MCV 86.2 12/20/2019   PLT 275 12/20/2019   Lab Results  Component Value Date   NA 140 12/20/2019   K 3.7 12/20/2019   CO2 28 12/20/2019   GLUCOSE 96 12/20/2019   BUN 12 12/20/2019   CREATININE 1.01 12/20/2019   BILITOT 0.2 09/23/2018   ALKPHOS 75 09/23/2018   AST 28 09/23/2018   ALT 25  09/23/2018   PROT 7.1 09/23/2018   ALBUMIN 4.5 09/23/2018   CALCIUM 8.8 (L) 12/20/2019   ANIONGAP 6 12/20/2019   Lab Results  Component Value Date   CHOL 172 09/23/2018   Lab Results  Component Value Date   HDL 43  09/23/2018   Lab Results  Component Value Date   LDLCALC 101 (H) 09/23/2018   Lab Results  Component Value Date   TRIG 142 09/23/2018   Lab Results  Component Value Date   CHOLHDL 4.0 09/23/2018   Lab Results  Component Value Date   HGBA1C 5.6 03/19/2020      Assessment & Plan:  Tajee was seen today for blood pressure check.  Diagnoses and all orders for this visit:  Type 2 diabetes mellitus without complication, without long-term current use of insulin (HCC) A1c 5.3 not classified per ADA guidelines as a diabetic.  Will recheck A1c on today's visit -     HgB A1c 5.6 gradually increasing -     Glucose (CBG) -     Microalbumin/Creatinine Ratio, Urine not indicated Monitor sweets, rice , potatoes and snacks  Hypertension, unspecified type Counseled on blood pressure goal of less than 130/80, low-sodium, DASH diet, medication compliance, 150 minutes of moderate intensity exercise per week.  Medication dosage changes from HCTZ 12.5 to HCTZ 25 mg daily. Discussed medication compliance, adverse effects.-     hydrochlorothiazide (HYDRODIURIL) 25 MG tablet; Take 1 tablet (25 mg total) by mouth daily.  Schizoaffective disorder, unspecified type ([place]) PHQ 15 AND  GAD 7 16 FOLLOWED BY A THERAPIST  Medication and management outside of practice.  Need for prophylactic vaccination and inoculation against influenza Completed   Meds ordered this encounter  Medications  . hydrochlorothiazide (HYDRODIURIL) 25 MG tablet    Sig: Take 1 tablet (25 mg total) by mouth daily.    Dispense:  90 tablet    Refill:  3    Follow-up: Return in about 3 months (around 06/19/2020) for Bp f/u in person.    Kerin Perna, NP

## 2020-03-20 LAB — MICROALBUMIN / CREATININE URINE RATIO
Creatinine, Urine: 290.9 mg/dL
Microalb/Creat Ratio: 5 mg/g creat (ref 0–29)
Microalbumin, Urine: 13.4 ug/mL

## 2020-03-27 ENCOUNTER — Encounter (INDEPENDENT_AMBULATORY_CARE_PROVIDER_SITE_OTHER): Payer: Self-pay

## 2020-06-19 ENCOUNTER — Ambulatory Visit (INDEPENDENT_AMBULATORY_CARE_PROVIDER_SITE_OTHER): Payer: Medicaid Other | Admitting: Primary Care

## 2020-08-02 DIAGNOSIS — Z0271 Encounter for disability determination: Secondary | ICD-10-CM

## 2020-10-01 ENCOUNTER — Telehealth (INDEPENDENT_AMBULATORY_CARE_PROVIDER_SITE_OTHER): Payer: Self-pay | Admitting: Primary Care

## 2020-10-01 NOTE — Telephone Encounter (Signed)
Lela from [facility] called and stated she was just made aware that the Pt was prescribed hydrochlorothiazide (HYDRODIURIL) 25 MG tablet Last year and he has not taken any of this medication/ she would like to know if pt should start taking this or if [name] needs to see him first / please advise

## 2020-10-03 NOTE — Telephone Encounter (Signed)
Please advise if patient should be taking HCTZ.

## 2020-10-05 NOTE — Telephone Encounter (Signed)
Lela returned call. She is aware that patient should be taking HCTZ. She will inform. Also scheduled OV for patient on 10/24/20 at 9:50 for BP/DM.

## 2020-10-05 NOTE — Telephone Encounter (Signed)
Left message asking Lela to return call to [facility] at [phone].

## 2020-10-24 ENCOUNTER — Encounter (INDEPENDENT_AMBULATORY_CARE_PROVIDER_SITE_OTHER): Payer: Self-pay | Admitting: Primary Care

## 2020-10-24 ENCOUNTER — Other Ambulatory Visit: Payer: Self-pay

## 2020-10-24 ENCOUNTER — Ambulatory Visit (INDEPENDENT_AMBULATORY_CARE_PROVIDER_SITE_OTHER): Payer: Medicaid Other | Admitting: Primary Care

## 2020-10-24 DIAGNOSIS — Z6839 Body mass index (BMI) 39.0-39.9, adult: Secondary | ICD-10-CM | POA: Diagnosis not present

## 2020-10-24 DIAGNOSIS — Z79899 Other long term (current) drug therapy: Secondary | ICD-10-CM

## 2020-10-24 DIAGNOSIS — I10 Essential (primary) hypertension: Secondary | ICD-10-CM | POA: Diagnosis not present

## 2020-10-24 DIAGNOSIS — E6609 Other obesity due to excess calories: Secondary | ICD-10-CM | POA: Diagnosis not present

## 2020-10-24 MED ORDER — HYDROCHLOROTHIAZIDE 25 MG PO TABS: 25.0000 mg | ORAL_TABLET | Freq: Every day | ORAL | 3 refills | Status: DC

## 2020-10-24 MED ORDER — CHOLECALCIFEROL 10 MCG (400 UNIT) PO TABS: 400.0000 [IU] | ORAL_TABLET | Freq: Every day | ORAL | 3 refills | Status: DC

## 2020-10-24 NOTE — Progress Notes (Signed)
Opp Jacob Price is a 35 y.o. male presents for hypertension evaluation, Denies shortness of breath, headaches, chest pain or lower extremity edema, sudden onset, vision changes, unilateral weakness, dizziness, paresthesias   Patient reports adherence with medications.  Dietary habits include: general healthy  Exercise habits include:sometimes  Family / Social history: grandma- maternal CVA   Past Medical History:  Diagnosis Date   Asperger's syndrome    Diabetes mellitus without complication ([place])    Hypertension    No past surgical history on file. No Known Allergies Current Outpatient Medications on File Prior to Visit  Medication Sig Dispense Refill   risperiDONE (RISPERDAL) 2 MG tablet Take 2 mg by mouth 3 (three) times daily.     risperiDONE microspheres (RISPERDAL CONSTA) 25 MG injection Inject 25 mg into the muscle every 14 (fourteen) days.     Accu-Chek FastClix Lancets MISC Use as directed three times daily 102 each 12   benztropine (COGENTIN) 0.5 MG tablet Take 0.5 mg by mouth 2 (two) times daily. (Patient not taking: Reported on 10/24/2020)     Blood Glucose Monitoring Suppl (ACCU-CHEK AVIVA PLUS) w/Device KIT See admin instructions.  1   glucose blood (ACCU-CHEK AVIVA PLUS) test strip Use TID 100 each 11   Insulin Pen Needle 29G X 12.7MM MISC Insulin administration 5  times daily 150 each 11   Lancets Misc. (ACCU-CHEK FASTCLIX LANCET) KIT Use as directed three times daily 1 kit 0   No current facility-administered medications on file prior to visit.   Social History   Socioeconomic History   Marital status: Single    Spouse name: Not on file   Number of children: Not on file   Years of education: Not on file   Highest education level: Not on file  Occupational History   Not on file  Tobacco Use   Smoking status: Never   Smokeless tobacco: Never  Substance and Sexual Activity   Alcohol use: No   Drug use: No   Sexual  activity: Not Currently  Other Topics Concern   Not on file  Social History Narrative   ** Merged History Encounter **       Social Determinants of Health   Financial Resource Strain: Not on file  Food Insecurity: Not on file  Transportation Needs: Not on file  Physical Activity: Not on file  Stress: Not on file  Social Connections: Not on file  Intimate Partner Violence: Not on file   No family history on file.   OBJECTIVE:  Vitals:   10/24/20 0943  BP: 126/89  Pulse: (!) 103  Resp: 20  Temp: (!) 97.3 F (36.3 C)  SpO2: 96%  Weight: 276 lb (125.2 kg)    Physical Exam Vitals reviewed.  Constitutional:      Appearance: He is obese.  HENT:     Head: Normocephalic.     Nose: Nose normal.  Eyes:     Extraocular Movements: Extraocular movements intact.     Pupils: Pupils are equal, round, and reactive to light.  Cardiovascular:     Rate and Rhythm: Normal rate and regular rhythm.  Pulmonary:     Effort: Pulmonary effort is normal.     Breath sounds: Normal breath sounds.  Abdominal:     General: Bowel sounds are normal. There is distension.     Palpations: Abdomen is soft.  Musculoskeletal:        General: Normal range of motion.     Cervical  back: Normal range of motion and neck supple.  Skin:    General: Skin is warm and dry.  Neurological:     Mental Status: He is alert and oriented to person, place, and time.  Psychiatric:        Mood and Affect: Mood normal.        Behavior: Behavior normal.    Review of Systems  All other systems reviewed and are negative.  Last 3 Office BP readings: BP Readings from Last 3 Encounters:  10/24/20 126/89  03/19/20 (!) 130/91  12/21/19 121/81    BMET    Component Value Date/Time   NA 140 12/20/2019 1645   NA 140 09/23/2018 1223   K 3.7 12/20/2019 1645   CL 106 12/20/2019 1645   CO2 28 12/20/2019 1645   GLUCOSE 96 12/20/2019 1645   BUN 12 12/20/2019 1645   BUN 14 09/23/2018 1223   CREATININE 1.01  12/20/2019 1645   CALCIUM 8.8 (L) 12/20/2019 1645   GFRNONAA >60 12/20/2019 1645   GFRAA >60 12/20/2019 1645    Renal function: CrCl cannot be calculated (Patient's most recent lab result is older than the maximum 21 days allowed.).  Clinical ASCVD: No  The ASCVD Risk score [name], et al., 2013) failed to calculate for the following reasons:   The 2013 ASCVD risk score is only valid for ages 73 to 39  ASCVD risk factors include- Jacob Price   ASSESSMENT & PLAN:  1. Hypocalcemia   2. Hypertension, unspecified type   3. Medication management   4. Class 2 obesity due to excess calories without serious comorbidity with body mass index (BMI) of 39.0 to 39.9 in adult     Meds ordered this encounter  Medications   hydrochlorothiazide (HYDRODIURIL) 25 MG tablet    Sig: Take 1 tablet (25 mg total) by mouth daily.    Dispense:  90 tablet    Refill:  3   cholecalciferol (VITAMIN D3) 10 MCG (400 UNIT) TABS tablet    Sig: Take 1 tablet (400 Units total) by mouth daily.    Dispense:  90 tablet    Refill:  3    Jacob Price was seen today for diabetes and hypertension.  Hypertension, unspecified type Diagnoses and all orders for this visit: -Counseled on lifestyle modifications for blood pressure control including reduced dietary sodium, increased exercise, weight reduction and adequate sleep. Also, educated patient about the risk for cardiovascular events, stroke and heart attack. Also counseled patient about the importance of medication adherence. If you participate in smoking, it is important to stop using tobacco as this will increase the risks associated with uncontrolled blood pressure.   -Hypertension longstanding diagnosed currently HCTZ $RemoveBefore'25mg'[id number]$  on current medications. Patient is adherent with current medications.   Goal BP:  For patients younger than 60: Goal BP < 130/80. For patients 60 and older: Goal BP < 140/90. For patients with diabetes: Goal BP < 130/80. Your most recent BP:  126/89  Minimize salt intake. Minimize alcohol intake  hydrochlorothiazide (HYDRODIURIL) 25 MG tablet; Take 1 tablet (25 mg total) by mouth daily. -     CMP14+EGFR  Hypocalcemia -     cholecalciferol (VITAMIN D3) 10 MCG (400 UNIT) TABS tablet; Take 1 tablet (400 Units total) by mouth daily. -     Vitamin D, 25-hydroxy     Medication management -     CMP14+EGFR  Class 2 obesity due to excess calories without serious comorbidity with body mass index (BMI) of 39.0  to 39.9 in adult Obesity is 30-39 indicating an excess in caloric intake or underlining conditions. This may lead to other co-morbidities. Lifestyle modifications of diet and exercise may reduce obesity.   -     Lipid Panel    This note has been created with Surveyor, quantity. Any transcriptional errors are unintentional.   Kerin Perna, NP 10/24/2020, 10:06 AM

## 2020-10-25 LAB — CMP14+EGFR
ALT: 63 IU/L — ABNORMAL HIGH (ref 0–44)
AST: 37 IU/L (ref 0–40)
Albumin/Globulin Ratio: 1.7 (ref 1.2–2.2)
Albumin: 4.8 g/dL (ref 4.0–5.0)
Alkaline Phosphatase: 91 IU/L (ref 44–121)
BUN/Creatinine Ratio: 15 (ref 9–20)
BUN: 14 mg/dL (ref 6–20)
Bilirubin Total: 0.4 mg/dL (ref 0.0–1.2)
CO2: 22 mmol/L (ref 20–29)
Calcium: 10 mg/dL (ref 8.7–10.2)
Chloride: 99 mmol/L (ref 96–106)
Creatinine, Ser: 0.92 mg/dL (ref 0.76–1.27)
Globulin, Total: 2.8 g/dL (ref 1.5–4.5)
Glucose: 138 mg/dL — ABNORMAL HIGH (ref 65–99)
Potassium: 3.9 mmol/L (ref 3.5–5.2)
Sodium: 139 mmol/L (ref 134–144)
Total Protein: 7.6 g/dL (ref 6.0–8.5)
eGFR: 111 mL/min/{1.73_m2} (ref >59)

## 2020-10-25 LAB — LIPID PANEL
Chol/HDL Ratio: 6.4 ratio — ABNORMAL HIGH (ref 0.0–5.0)
Cholesterol, Total: 243 mg/dL — ABNORMAL HIGH (ref 100–199)
HDL: 38 mg/dL — ABNORMAL LOW (ref >39)
LDL Chol Calc (NIH): 165 mg/dL — ABNORMAL HIGH (ref 0–99)
Triglycerides: 216 mg/dL — ABNORMAL HIGH (ref 0–149)
VLDL Cholesterol Cal: 40 mg/dL (ref 5–40)

## 2020-10-25 LAB — VITAMIN D 25 HYDROXY (VIT D DEFICIENCY, FRACTURES): Vit D, 25-Hydroxy: 19.1 ng/mL — ABNORMAL LOW (ref 30.0–100.0)

## 2020-11-01 ENCOUNTER — Other Ambulatory Visit (INDEPENDENT_AMBULATORY_CARE_PROVIDER_SITE_OTHER): Payer: Self-pay | Admitting: Primary Care

## 2020-11-01 MED ORDER — ERGOCALCIFEROL 1.25 MG (50000 UT) PO CAPS: 50000.0000 [IU] | ORAL_CAPSULE | ORAL | 0 refills | Status: DC

## 2020-11-01 MED ORDER — ROSUVASTATIN CALCIUM 40 MG PO TABS: 40.0000 mg | ORAL_TABLET | Freq: Every day | ORAL | 3 refills | Status: DC

## 2020-12-18 ENCOUNTER — Other Ambulatory Visit (INDEPENDENT_AMBULATORY_CARE_PROVIDER_SITE_OTHER): Payer: Self-pay | Admitting: Primary Care

## 2020-12-18 DIAGNOSIS — I10 Essential (primary) hypertension: Secondary | ICD-10-CM

## 2020-12-18 MED ORDER — HYDROCHLOROTHIAZIDE 25 MG PO TABS: 25.0000 mg | ORAL_TABLET | Freq: Every day | ORAL | 1 refills | Status: DC

## 2020-12-18 NOTE — Telephone Encounter (Signed)
Medication Refill - Medication: hydrochlorothiazide (HYDRODIURIL) 25 MG tablet  Has the patient contacted their pharmacy? Yes.   (Agent: If no, request that the patient contact the pharmacy for the refill.) (Agent: If yes, when and what did the pharmacy advise?)  Preferred Pharmacy (with phone number or street name):   Will call back with Pharmacy (pt does not know the name of it at this time)   Agent: Please be advised that RX refills may take up to 3 business days. We ask that you follow-up with your pharmacy.

## 2020-12-18 NOTE — Telephone Encounter (Signed)
Pt called to report that he wants his Rx sent  [facility] [address]   Phone: [phone]

## 2021-04-25 ENCOUNTER — Ambulatory Visit (INDEPENDENT_AMBULATORY_CARE_PROVIDER_SITE_OTHER): Payer: Medicaid Other | Admitting: Primary Care

## 2021-05-14 ENCOUNTER — Other Ambulatory Visit: Payer: Self-pay

## 2021-05-14 ENCOUNTER — Encounter (INDEPENDENT_AMBULATORY_CARE_PROVIDER_SITE_OTHER): Payer: Self-pay | Admitting: Primary Care

## 2021-05-14 ENCOUNTER — Ambulatory Visit (INDEPENDENT_AMBULATORY_CARE_PROVIDER_SITE_OTHER): Payer: Medicaid Other | Admitting: Primary Care

## 2021-05-14 VITALS — BP 131/90 | HR 93 | Temp 97.5°F | Ht 70.0 in | Wt 233.4 lb

## 2021-05-14 DIAGNOSIS — E119 Type 2 diabetes mellitus without complications: Secondary | ICD-10-CM | POA: Diagnosis not present

## 2021-05-14 DIAGNOSIS — E559 Vitamin D deficiency, unspecified: Secondary | ICD-10-CM | POA: Diagnosis not present

## 2021-05-14 DIAGNOSIS — I10 Essential (primary) hypertension: Secondary | ICD-10-CM

## 2021-05-14 DIAGNOSIS — Z23 Encounter for immunization: Secondary | ICD-10-CM

## 2021-05-14 LAB — POCT GLYCOSYLATED HEMOGLOBIN (HGB A1C)

## 2021-05-14 MED ORDER — GLIPIZIDE 10 MG PO TABS: 10.0000 mg | ORAL_TABLET | Freq: Two times a day (BID) | ORAL | 1 refills | Status: DC

## 2021-05-14 MED ORDER — BLOOD GLUCOSE METER KIT: PACK | 0 refills | Status: AC

## 2021-05-14 MED ORDER — JANUMET 50-1000 MG PO TABS: 1.0000 | ORAL_TABLET | Freq: Two times a day (BID) | ORAL | 0 refills | Status: DC

## 2021-05-14 NOTE — Patient Instructions (Signed)
Influenza, Adult °Influenza is also called "the flu." It is an infection in the lungs, nose, and throat (respiratory tract). It spreads easily from person to person (is contagious). The flu causes symptoms that are like a cold, along with high fever and body aches. °What are the causes? °This condition is caused by the influenza virus. You can get the virus by: °Breathing in droplets that are in the air after a person infected with the flu coughed or sneezed. °Touching something that has the virus on it and then touching your mouth, nose, or eyes. °What increases the risk? °Certain things may make you more likely to get the flu. These include: °Not washing your hands often. °Having close contact with many people during cold and flu season. °Touching your mouth, eyes, or nose without first washing your hands. °Not getting a flu shot every year. °You may have a higher risk for the flu, and serious problems, such as a lung infection (pneumonia), if you: °Are older than 65. °Are pregnant. °Have a weakened disease-fighting system (immune system) because of a disease or because you are taking certain medicines. °Have a long-term (chronic) condition, such as: °Heart, kidney, or lung disease. °Diabetes. °Asthma. °Have a liver disorder. °Are very overweight (morbidly obese). °Have anemia. °What are the signs or symptoms? °Symptoms usually begin suddenly and last 4-14 days. They may include: °Fever and chills. °Headaches, body aches, or muscle aches. °Sore throat. °Cough. °Runny or stuffy (congested) nose. °Feeling discomfort in your chest. °Not wanting to eat as much as normal. °Feeling weak or tired. °Feeling dizzy. °Feeling sick to your stomach or throwing up. °How is this treated? °If the flu is found early, you can be treated with antiviral medicine. This can help to reduce how bad the illness is and how long it lasts. This may be given by mouth or through an IV tube. °Taking care of yourself at home can help your  symptoms get better. Your doctor may want you to: °Take over-the-counter medicines. °Drink plenty of fluids. °The flu often goes away on its own. If you have very bad symptoms or other problems, you may be treated in a hospital. °Follow these instructions at home: °  °Activity °Rest as needed. Get plenty of sleep. °Stay home from work or school as told by your doctor. °Do not leave home until you do not have a fever for 24 hours without taking medicine. °Leave home only to go to your doctor. °Eating and drinking °Take an ORS (oral rehydration solution). This is a drink that is sold at pharmacies and stores. °Drink enough fluid to keep your pee pale yellow. °Drink clear fluids in small amounts as you are able. Clear fluids include: °Water. °Ice chips. °Fruit juice mixed with water. °Low-calorie sports drinks. °Eat bland foods that are easy to digest. Eat small amounts as you are able. These foods include: °Bananas. °Applesauce. °Rice. °Lean meats. °Toast. °Crackers. °Do not eat or drink: °Fluids that have a lot of sugar or caffeine. °Alcohol. °Spicy or fatty foods. °General instructions °Take over-the-counter and prescription medicines only as told by your doctor. °Use a cool mist humidifier to add moisture to the air in your home. This can make it easier for you to breathe. °When using a cool mist humidifier, clean it daily. Empty water and replace with clean water. °Cover your mouth and nose when you cough or sneeze. °Wash your hands with soap and water often and for at least 20 seconds. This is also important after   you cough or sneeze. If you cannot use soap and water, use alcohol-based hand sanitizer. °Keep all follow-up visits. °How is this prevented? ° °Get a flu shot every year. You may get the flu shot in late summer, fall, or winter. Ask your doctor when you should get your flu shot. °Avoid contact with people who are sick during fall and winter. This is cold and flu season. °Contact a doctor if: °You get  new symptoms. °You have: °Chest pain. °Watery poop (diarrhea). °A fever. °Your cough gets worse. °You start to have more mucus. °You feel sick to your stomach. °You throw up. °Get help right away if you: °Have shortness of breath. °Have trouble breathing. °Have skin or nails that turn a bluish color. °Have very bad pain or stiffness in your neck. °Get a sudden headache. °Get sudden pain in your face or ear. °Cannot eat or drink without throwing up. °These symptoms may represent a serious problem that is an emergency. Get medical help right away. Call your local emergency services (911 in the [place] °Do not wait to see if the symptoms will go away. °Do not drive yourself to the hospital. °Summary °Influenza is also called "the flu." It is an infection in the lungs, nose, and throat. It spreads easily from person to person. °Take over-the-counter and prescription medicines only as told by your doctor. °Getting a flu shot every year is the best way to not get the flu. °This information is not intended to replace advice given to you by your health care provider. Make sure you discuss any questions you have with your health care provider. °Document Revised: 12/02/2019 Document Reviewed: 12/02/2019 °Elsevier Patient Education © 2022 [employer] ° °

## 2021-05-14 NOTE — Progress Notes (Deleted)
Subjective:  Patient ID: Jacob Price, male    DOB: [birth year]  Age: 36 y.o. MRN: [id number]  CC: Hypertension (/) and Diabetes   HPI Jacob Price presents forFollow-up of diabetes. Patient does not check blood sugar at home  Compliant with meds - {yes no free text:20080} Checking CBGs? {yes no free text:20080}  Fasting avg -   Postprandial average -  Exercising regularly? - {yes no free text:20080} Watching carbohydrate intake? - {yes no free text:20080} Neuropathy ? - {yes no free text:20080} Hypoglycemic events - {yes no free text:20080}  - Recovers with :   Pertinent ROS:  Polyuria - {yes no free text:20080} Polydipsia - {yes no free text:20080} Vision problems - {yes no free text:20080}  Medications as noted below. Taking them regularly without complication/adverse reaction being reported today.   History Jacob Price has a past medical history of Asperger's syndrome, Diabetes mellitus without complication ([place]), and Hypertension.   He has no past surgical history on file.   His family history is not on file.He reports that he has never smoked. He has never used smokeless tobacco. He reports that he does not drink alcohol and does not use drugs.  Current Outpatient Medications on File Prior to Visit  Medication Sig Dispense Refill   Accu-Chek FastClix Lancets MISC Use as directed three times daily 102 each 12   Blood Glucose Monitoring Suppl (ACCU-CHEK AVIVA PLUS) w/Device KIT See admin instructions.  1   cholecalciferol (VITAMIN D3) 10 MCG (400 UNIT) TABS tablet Take 1 tablet (400 Units total) by mouth daily. 90 tablet 3   glucose blood (ACCU-CHEK AVIVA PLUS) test strip Use TID 100 each 11   hydrochlorothiazide (HYDRODIURIL) 25 MG tablet Take 1 tablet (25 mg total) by mouth daily. 90 tablet 1   Insulin Pen Needle 29G X 12.7MM MISC Insulin administration 5  times daily 150 each 11   Lancets Misc. (ACCU-CHEK FASTCLIX LANCET) KIT Use as directed three  times daily 1 kit 0   risperiDONE (RISPERDAL) 2 MG tablet Take 2 mg by mouth 3 (three) times daily.     risperiDONE microspheres (RISPERDAL CONSTA) 25 MG injection Inject 25 mg into the muscle every 14 (fourteen) days.     rosuvastatin (CRESTOR) 40 MG tablet Take 1 tablet (40 mg total) by mouth daily. 90 tablet 3   benztropine (COGENTIN) 0.5 MG tablet Take 0.5 mg by mouth 2 (two) times daily. (Patient not taking: Reported on 05/14/2021)     No current facility-administered medications on file prior to visit.    ROS Review of Systems  Objective:  BP 131/90 (BP Location: Right Arm, Patient Position: Sitting, Cuff Size: Normal)    Pulse 93    Temp (!) 97.5 F (36.4 C) (Temporal)    Ht '5\' 10"'  (1.778 m)    Wt 233 lb 6.4 oz (105.9 kg)    SpO2 96%    BMI 33.49 kg/m   BP Readings from Last 3 Encounters:  05/14/21 131/90  10/24/20 126/89  03/19/20 (!) 130/91    Wt Readings from Last 3 Encounters:  05/14/21 233 lb 6.4 oz (105.9 kg)  10/24/20 276 lb (125.2 kg)  03/19/20 246 lb 9.6 oz (111.9 kg)    Physical Exam  Lab Results  Component Value Date   HGBA1C 5.6 03/19/2020   HGBA1C 5.3 09/15/2019   HGBA1C 5.1 04/14/2019    Lab Results  Component Value Date   WBC 6.5 12/20/2019   HGB 14.1 12/20/2019   HCT 42.6 12/20/2019  PLT 275 12/20/2019   GLUCOSE 138 (H) 10/24/2020   CHOL 243 (H) 10/24/2020   TRIG 216 (H) 10/24/2020   HDL 38 (L) 10/24/2020   LDLCALC 165 (H) 10/24/2020   ALT 63 (H) 10/24/2020   AST 37 10/24/2020   NA 139 10/24/2020   K 3.9 10/24/2020   CL 99 10/24/2020   CREATININE 0.92 10/24/2020   BUN 14 10/24/2020   CO2 22 10/24/2020   TSH 1.070 11/03/2016   HGBA1C 5.6 03/19/2020     Assessment & Plan:   Jacob Price was seen today for hypertension and diabetes.  Diagnoses and all orders for this visit:  Type 2 diabetes mellitus without complication, without long-term current use of insulin ([facility]) -     HgB A1c  Need for pneumococcal vaccination -      Pneumococcal conjugate vaccine 20-valent (Prevnar 20)   I have discontinued Jacob Price's ergocalciferol. I am also having him maintain his risperiDONE microspheres, benztropine, risperiDONE, Accu-Chek Aviva Plus, Insulin Pen Needle, glucose blood, Accu-Chek FastClix Lancet, Accu-Chek FastClix Lancets, cholecalciferol, rosuvastatin, and hydrochlorothiazide.  No orders of the defined types were placed in this encounter.    Follow-up:   No follow-ups on file.  The above assessment and management plan was discussed with the patient. The patient verbalized understanding of and has agreed to the management plan. Patient is aware to call the clinic if symptoms fail to improve or worsen. Patient is aware when to return to the clinic for a follow-up visit. Patient educated on when it is appropriate to go to the emergency department.   Juluis Mire, NP-C

## 2021-05-15 LAB — CBC WITH DIFFERENTIAL/PLATELET
Basophils Absolute: 0 10*3/uL (ref 0.0–0.2)
Basos: 1 %
EOS (ABSOLUTE): 0 10*3/uL (ref 0.0–0.4)
Eos: 1 %
Hematocrit: 45.2 % (ref 37.5–51.0)
Hemoglobin: 16 g/dL (ref 13.0–17.7)
Immature Grans (Abs): 0 10*3/uL (ref 0.0–0.1)
Immature Granulocytes: 0 %
Lymphocytes Absolute: 1.7 10*3/uL (ref 0.7–3.1)
Lymphs: 41 %
MCH: 30.4 pg (ref 26.6–33.0)
MCHC: 35.4 g/dL (ref 31.5–35.7)
MCV: 86 fL (ref 79–97)
Monocytes Absolute: 0.4 10*3/uL (ref 0.1–0.9)
Monocytes: 9 %
Neutrophils Absolute: 2.1 10*3/uL (ref 1.4–7.0)
Neutrophils: 48 %
Platelets: 316 10*3/uL (ref 150–450)
RBC: 5.27 x10E6/uL (ref 4.14–5.80)
RDW: 12.8 % (ref 11.6–15.4)
WBC: 4.2 10*3/uL (ref 3.4–10.8)

## 2021-05-15 LAB — CMP14+EGFR
ALT: 15 IU/L (ref 0–44)
AST: 16 IU/L (ref 0–40)
Albumin/Globulin Ratio: 1.5 (ref 1.2–2.2)
Albumin: 4.5 g/dL (ref 4.0–5.0)
Alkaline Phosphatase: 203 IU/L — ABNORMAL HIGH (ref 44–121)
BUN/Creatinine Ratio: 20 (ref 9–20)
BUN: 15 mg/dL (ref 6–20)
Bilirubin Total: 0.2 mg/dL (ref 0.0–1.2)
CO2: 20 mmol/L (ref 20–29)
Calcium: 9.8 mg/dL (ref 8.7–10.2)
Chloride: 95 mmol/L — ABNORMAL LOW (ref 96–106)
Creatinine, Ser: 0.75 mg/dL — ABNORMAL LOW (ref 0.76–1.27)
Globulin, Total: 3.1 g/dL (ref 1.5–4.5)
Glucose: 599 mg/dL (ref 70–99)
Potassium: 4.6 mmol/L (ref 3.5–5.2)
Sodium: 133 mmol/L — ABNORMAL LOW (ref 134–144)
Total Protein: 7.6 g/dL (ref 6.0–8.5)
eGFR: 120 mL/min/{1.73_m2} (ref >59)

## 2021-05-15 LAB — LIPID PANEL
Chol/HDL Ratio: 7.8 ratio — ABNORMAL HIGH (ref 0.0–5.0)
Cholesterol, Total: 226 mg/dL — ABNORMAL HIGH (ref 100–199)
HDL: 29 mg/dL — ABNORMAL LOW (ref >39)
Triglycerides: 972 mg/dL (ref 0–149)

## 2021-05-15 LAB — MICROALBUMIN, URINE: Microalbumin, Urine: 3.5 ug/mL

## 2021-05-15 LAB — VITAMIN D 25 HYDROXY (VIT D DEFICIENCY, FRACTURES)

## 2021-05-16 ENCOUNTER — Telehealth (INDEPENDENT_AMBULATORY_CARE_PROVIDER_SITE_OTHER): Payer: Self-pay | Admitting: *Deleted

## 2021-05-16 NOTE — Telephone Encounter (Signed)
Prior authorization form placed on PCP desk.

## 2021-05-16 NOTE — Telephone Encounter (Signed)
Patient called he only was able to get 1 medication at pharmacy. Call to pharmacy to verify they got both Rx- Janumet Rx will need PA- Cover My Meds- key code #[id number]. Patient notified by pharmacy yesterday- but not sure he understood- call sent to office for PA follow up

## 2021-05-17 ENCOUNTER — Other Ambulatory Visit: Payer: Self-pay

## 2021-05-17 ENCOUNTER — Telehealth: Payer: Self-pay

## 2021-05-17 NOTE — Telephone Encounter (Signed)
So can I change his pharmacy for [facility] and delivery

## 2021-05-17 NOTE — Telephone Encounter (Signed)
PA for Janumet has been approved until 05/17/2022

## 2021-05-22 ENCOUNTER — Other Ambulatory Visit: Payer: Self-pay

## 2021-05-22 ENCOUNTER — Telehealth: Payer: Self-pay

## 2021-05-22 ENCOUNTER — Ambulatory Visit (INDEPENDENT_AMBULATORY_CARE_PROVIDER_SITE_OTHER): Payer: Medicaid Other | Admitting: Primary Care

## 2021-05-22 ENCOUNTER — Encounter (INDEPENDENT_AMBULATORY_CARE_PROVIDER_SITE_OTHER): Payer: Self-pay | Admitting: Primary Care

## 2021-05-22 VITALS — BP 130/86 | HR 78 | Temp 98.0°F | Ht 70.0 in | Wt 231.4 lb

## 2021-05-22 DIAGNOSIS — E782 Mixed hyperlipidemia: Secondary | ICD-10-CM | POA: Diagnosis not present

## 2021-05-22 DIAGNOSIS — E119 Type 2 diabetes mellitus without complications: Secondary | ICD-10-CM

## 2021-05-22 MED ORDER — ROSUVASTATIN CALCIUM 40 MG PO TABS: 40.0000 mg | ORAL_TABLET | Freq: Every day | ORAL | 1 refills | Status: DC

## 2021-05-22 MED ORDER — FENOFIBRATE 145 MG PO TABS: 145.0000 mg | ORAL_TABLET | Freq: Every day | ORAL | 1 refills | Status: DC

## 2021-05-22 NOTE — Telephone Encounter (Signed)
Spoke with patient when he was at his appointment today with [name], NP.  Explained about the services offered by the [facility] and that there is no charge for the program.  He was agreeable to placing the referral.  This CM emphasized the benefits that he can receive from the EMT services and he was very pleased.

## 2021-05-23 NOTE — Progress Notes (Signed)
[place], is a 36 y.o. male  UVO:[id number]  [id number]:[id number]  DOB - [birth year]  Chief Complaint  Patient presents with   Hypertension        Diabetes       Subjective:   Jacob Price is a 36 y.o. male here today for a follow up visit. He has not been seen for diabetes or HTN. He endorses polyuria, polydipsia and vision changes. Patient has No headache, No chest pain, No abdominal pain - No Nausea, No new weakness tingling or numbness, No Cough - SOB. RTN with A1C results he explained that he was not a diabetic because all he had to do was eat right and exercise.   No problems updated.  ALLERGIES: No Known Allergies  PAST MEDICAL HISTORY: Past Medical History:  Diagnosis Date   Asperger's syndrome    Diabetes mellitus without complication ([place])    Hypertension     MEDICATIONS AT HOME: Prior to Admission medications   Medication Sig Start Date End Date Taking? Authorizing Provider  Accu-Chek FastClix Lancets MISC Use as directed three times daily 09/14/18  Yes Kerin Perna, NP  blood glucose meter kit and supplies Dispense based on patient and insurance preference. Use up to four times daily as directed. (FOR ICD-10 E10.9, E11.9). 05/14/21  Yes Kerin Perna, NP  Blood Glucose Monitoring Suppl (ACCU-CHEK AVIVA PLUS) w/Device KIT See admin instructions. 10/16/16  Yes [provider]  cholecalciferol (VITAMIN D3) 10 MCG (400 UNIT) TABS tablet Take 1 tablet (400 Units total) by mouth daily. 10/24/20  Yes Kerin Perna, NP  glipiZIDE (GLUCOTROL) 10 MG tablet Take 1 tablet (10 mg total) by mouth 2 (two) times daily before a meal. 05/14/21  Yes Kerin Perna, NP  glucose blood (ACCU-CHEK AVIVA PLUS) test strip Use TID 11/12/17  Yes Clent Demark, PA-C  hydrochlorothiazide (HYDRODIURIL) 25 MG tablet Take 1 tablet (25 mg total) by mouth daily. 12/18/20  Yes Kerin Perna, NP  Insulin Pen Needle 29G X 12.7MM  MISC Insulin administration 5  times daily 11/12/17  Yes Clent Demark, PA-C  Lancets Misc. (ACCU-CHEK FASTCLIX LANCET) KIT Use as directed three times daily 09/14/18  Yes Kerin Perna, NP  risperiDONE (RISPERDAL) 2 MG tablet Take 2 mg by mouth 3 (three) times daily. 09/28/14  Yes [provider]  risperiDONE microspheres (RISPERDAL CONSTA) 25 MG injection Inject 25 mg into the muscle every 14 (fourteen) days.   Yes [provider]  sitaGLIPtin-metformin (JANUMET) 50-1000 MG tablet Take 1 tablet by mouth 2 (two) times daily with a meal. 05/14/21  Yes Kerin Perna, NP  benztropine (COGENTIN) 0.5 MG tablet Take 0.5 mg by mouth 2 (two) times daily. Patient not taking: Reported on 05/14/2021    [provider]  fenofibrate (TRICOR) 145 MG tablet Take 1 tablet (145 mg total) by mouth daily. 05/22/21   Kerin Perna, NP  rosuvastatin (CRESTOR) 40 MG tablet Take 1 tablet (40 mg total) by mouth daily. 05/22/21   Kerin Perna, NP    Objective:   Vitals:   05/14/21 0838  BP: 131/90  Pulse: 93  Temp: (!) 97.5 F (36.4 C)  TempSrc: Temporal  SpO2: 96%  Weight: 233 lb 6.4 oz (105.9 kg)  Height: '5\' 10"'  (1.778 m)  Comprehensive ROS Pertinent positive and negative noted in HPI   Exam General appearance : Awake, alert, not in any distress. Speech Clear. Not toxic looking HEENT: Atraumatic  and Normocephalic, pupils equally reactive to light and accomodation Neck: Supple, no JVD. No cervical lymphadenopathy.  Chest: Good air entry bilaterally, no added sounds  CVS: S1 S2 regular, no murmurs.  Abdomen: Bowel sounds present, Non tender and not distended with no gaurding, rigidity or rebound. Extremities: B/L Lower Ext shows no edema, both legs are warm to touch Neurology: Awake alert, and oriented X 3, CN II-XII intact, Non focal Skin: No Rash  Data Review Lab Results  Component Value Date   HGBA1C  05/14/2021     Comment:     a1c too high to  read.. code 106= A1c greater than 15   HGBA1C 5.6 03/19/2020   HGBA1C 5.3 09/15/2019    Assessment & Plan  Jacob Price was seen today for hypertension and diabetes.  Diagnoses and all orders for this visit:  Type 2 diabetes mellitus without complication, without long-term current use of insulin (HCC) -     HgB A1c > 15 unable to read Diabetes began now with ADA recommends the following therapeutic goals for glycemic control related to A1c measurements: Goal of therapy: Less than 6.5 hemoglobin A1c.  Reference clinical practice recommendations.Foods that are high in carbohydrates are the following rice, potatoes, breads, sugars, and pastas.  Reduction in the intake (eating) will assist in lowering your blood sugars. He will return with meter for teaching  -     Microalbumin, urine -     Lipid Panel -     CBC with Differential  Need for pneumococcal vaccination -     Pneumococcal conjugate vaccine 20-valent (Prevnar 20)  Need for immunization against influenza -     Flu Vaccine QUAD 71moIM (Fluarix, Fluzone & Alfiuria Quad PF)  Vitamin D deficiency Vitamin D is needed to make and keep bones strong. The patient will need to take a prescription strength vitamin D tablet once weekly until next appointment.  Vitamin D level will be rechecked at future visit.  I have sent the vitamin D tablet to the pharmacy and it should be ready for pick up.  Please remind patient of upcoming appointments and/or schedule for follow-up if needed in 6-8 weeks.  -     Vitamin D, 25-hydroxy  Hypertension, unspecified type .Counseled on blood pressure goal of less than 130/80, low-sodium, DASH diet, medication compliance, 150 minutes of moderate intensity exercise per week. Discussed medication compliance, adverse effects. ( [place]fries , can foods, ) -     CMP14+EGFR  Other orders -     sitaGLIPtin-metformin (JANUMET) 50-1000 MG tablet; Take 1 tablet by mouth 2 (two) times daily with a meal. -     glipiZIDE  (GLUCOTROL) 10 MG tablet; Take 1 tablet (10 mg total) by mouth 2 (two) times daily before a meal. -     blood glucose meter kit and supplies; Dispense based on patient and insurance preference. Use up to four times daily as directed. (FOR ICD-10 E10.9, E11.9).    Patient have been counseled extensively about nutrition and exercise. Other issues discussed during this visit include: low cholesterol diet, weight control and daily exercise, foot care, annual eye examinations at Ophthalmology, importance of adherence with medications and regular follow-up. We also discussed long term complications of uncontrolled diabetes and hypertension.   Return in about 6 weeks (around 06/25/2021) for CBG f/u bring meter .  The patient was given clear instructions to go to ER or return to medical center if symptoms don't improve, worsen or new problems develop. The patient verbalized  understanding. The patient was told to call to get lab results if they haven't heard anything in the next week.   This note has been created with Surveyor, quantity. Any transcriptional errors are unintentional.   Kerin Perna, NP 05/23/2021, 9:12 AM

## 2021-05-24 NOTE — Progress Notes (Signed)
[place], is a 36 y.o. male  GPQ:[id number]  [id number]  DOB - [birth year]  Chief Complaint  Patient presents with   diabetic teaching        Subjective:   Jacob Price is a 36 y.o. male here today for a follow up visit. Patient has No headache, No chest pain, No abdominal pain - No Nausea, No new weakness tingling or numbness, No Cough - SOB.  No problems updated.  ALLERGIES: No Known Allergies  PAST MEDICAL HISTORY: Past Medical History:  Diagnosis Date   Asperger's syndrome    Diabetes mellitus without complication ([place])    Hypertension     MEDICATIONS AT HOME: Prior to Admission medications   Medication Sig Start Date End Date Taking? Authorizing Provider  Accu-Chek FastClix Lancets MISC Use as directed three times daily 09/14/18  Yes Jacob Perna, NP  blood glucose meter kit and supplies Dispense based on patient and insurance preference. Use up to four times daily as directed. (FOR ICD-10 E10.9, E11.9). 05/14/21  Yes Jacob Perna, NP  Blood Glucose Monitoring Suppl (ACCU-CHEK AVIVA PLUS) w/Device KIT See admin instructions. 10/16/16  Yes [provider]  cholecalciferol (VITAMIN D3) 10 MCG (400 UNIT) TABS tablet Take 1 tablet (400 Units total) by mouth daily. 10/24/20  Yes Jacob Perna, NP  fenofibrate (TRICOR) 145 MG tablet Take 1 tablet (145 mg total) by mouth daily. 05/22/21  Yes Jacob Perna, NP  glipiZIDE (GLUCOTROL) 10 MG tablet Take 1 tablet (10 mg total) by mouth 2 (two) times daily before a meal. 05/14/21  Yes Jacob Perna, NP  glucose blood (ACCU-CHEK AVIVA PLUS) test strip Use TID 11/12/17  Yes Clent Demark, PA-C  hydrochlorothiazide (HYDRODIURIL) 25 MG tablet Take 1 tablet (25 mg total) by mouth daily. 12/18/20  Yes Jacob Perna, NP  Insulin Pen Needle 29G X 12.7MM MISC Insulin administration 5  times daily 11/12/17  Yes Clent Demark, PA-C  Lancets Misc.  (ACCU-CHEK FASTCLIX LANCET) KIT Use as directed three times daily 09/14/18  Yes Jacob Perna, NP  risperiDONE (RISPERDAL) 2 MG tablet Take 2 mg by mouth 3 (three) times daily. 09/28/14  Yes [provider]  risperiDONE microspheres (RISPERDAL CONSTA) 25 MG injection Inject 25 mg into the muscle every 14 (fourteen) days.   Yes [provider]  rosuvastatin (CRESTOR) 40 MG tablet Take 1 tablet (40 mg total) by mouth daily. 05/22/21  Yes Jacob Perna, NP  sitaGLIPtin-metformin (JANUMET) 50-1000 MG tablet Take 1 tablet by mouth 2 (two) times daily with a meal. 05/14/21  Yes Jacob Perna, NP  benztropine (COGENTIN) 0.5 MG tablet Take 0.5 mg by mouth 2 (two) times daily. Patient not taking: Reported on 05/14/2021    [provider]    Objective:   Vitals:   05/22/21 1432  BP: 130/86  Pulse: 78  Temp: 98 F (36.7 C)  TempSrc: Oral  SpO2: 96%  Weight: 231 lb 6.4 oz (105 kg)  Height: $Remove'5\' 10"'RqdmSIu$  (1.778 m)   Exam General appearance : Awake, alert, not in any distress. Speech Clear. Not toxic looking HEENT: Atraumatic and Normocephalic, pupils equally reactive to light and accomodation Neck: Supple, no JVD. No cervical lymphadenopathy.  Chest: Good air entry bilaterally, no added sounds  CVS: S1 S2 regular, no murmurs.  Abdomen: Bowel sounds present, Non tender and not distended with no gaurding, rigidity or rebound. Extremities: B/L Lower Ext shows no edema, both legs  are warm to touch Neurology: Awake alert, and oriented X 3, C Skin: No Rash  Data Review Lab Results  Component Value Date   HGBA1C  05/14/2021     Comment:     a1c too high to read.. code 106= A1c greater than 15   HGBA1C 5.6 03/19/2020   HGBA1C 5.3 09/15/2019    Assessment & Plan   1. Mixed hyperlipidemia Discussed risk of him having a stroke GM/Mother both suffered from CVA and his TG > 900 and Cholesterol > 200. After explaining causes and effect patient verbalized  understanding.  - rosuvastatin (CRESTOR) 40 MG tablet; Take 1 tablet (40 mg total) by mouth daily.  Dispense: 90 tablet; Refill: 1 - fenofibrate (TRICOR) 145 MG tablet; Take 1 tablet (145 mg total) by mouth daily.  Dispense: 90 tablet; Refill: 1  2. Type 2 diabetes mellitus without complication, without long-term current use of insulin ([place]) Entire visit was education and treatment . Taught how to check CBG and rtn demonstration. Clinical pharmacist and case manager also on visit via web.  Patient have been counseled extensively about nutrition and exercise. Other issues discussed during this visit include: low cholesterol diet, weight control and daily exercise, foot care, annual eye examinations at Ophthalmology, importance of adherence with medications and regular follow-up. We also discussed long term complications of uncontrolled diabetes and hypertension.   Return in about 3 weeks (around 06/12/2021) for [facility] . > 80 of his visit was teaching on DM and TG extended time needed secondary to intellectual ability  The patient was given clear instructions to go to ER or return to medical center if symptoms don't improve, worsen or new problems develop. The patient verbalized understanding. The patient was told to call to get lab results if they haven't heard anything in the next week.   This note has been created with Surveyor, quantity. Any transcriptional errors are unintentional.   Jacob Perna, NP 05/24/2021, 11:20 AM

## 2021-05-27 ENCOUNTER — Telehealth: Payer: Self-pay

## 2021-05-27 NOTE — Telephone Encounter (Signed)
Opened in error

## 2021-05-27 NOTE — Telephone Encounter (Signed)
Referral for [employer] [facility] sent to Sharyn Creamer, [name], Maralyn Sago [facility].

## 2021-06-04 ENCOUNTER — Telehealth: Payer: Self-pay

## 2021-06-04 ENCOUNTER — Other Ambulatory Visit (HOSPITAL_COMMUNITY): Payer: Self-pay

## 2021-06-04 NOTE — Telephone Encounter (Signed)
[name], I received the following update from [name], EMT/ [employer]. She saw him for the first time this morning and she has a few questions:   Does he see a mental health provider for his behavioral or does [name] handle all his medical issues?   He had a bottle of buspirone dated back in 10/21- dont look like he has been taking it though. It wasn't on the list so I told him to put it aside.     He has a benztropine listed but reported not taking by mother, does he need to be on this?    Also his crestor, fenofibrate and janument is at the pharmacy awaiting p/u. His vit d is being moved from [facility] to the [facility].   His diet consists of raman noodles and hot dogs, so we talked about that briefly this morning. Did not want to overwhelm him quite yet. But the faculty staff at the living facility said they are trying to work with him doing one on one cooking classes for him and group lessons too.   Was there anything else in specific that [name] wanted to me keep an eye on for him?   Today his CBG was 187 after he had eaten already. Not taken meds yet this morning.   Just wanted to pass along quick update from our initial visit.   I did mention a pill box for him, but told him to think about it, again I didn't want to overwhelm him too much today.

## 2021-06-04 NOTE — Progress Notes (Signed)
Paramedicine Encounter    Patient ID: Jacob Price, male    DOB: [birth year], 36 y.o.   MRN: [id number]  First visit with the pt in the home.  He lives in a mental health apt building.  He has lived here since 2020.  He does have family close by.  The facility manages his transportation.  His grandma will take him to get groceries.  Pharmacy is Insurance underwriter at [facility]. No issues affording meds. A facility member takes him to get his meds.  He reports good compliance with meds.  He does get disability.  He denies any issues reading or issues with vision.    He reports no sob.  No dizziness. Appetite is decent.  He cooks for himself.  Meds reviewed- Does not have the benztropine- does he still need to be on this? Does not have the vit d-[facility]-needs to be sent to [facility] if possible  Does not have the fenofibrate yet- Does not have the crestor.  Does not have the janumet.   -contacted [facility] and the crestor, janumet and feno is at the pharmacy.  [facility] is [facility].  She also confirmed he is not taking the buspirone rx.  He has a buspirone bottle   He denies hearing voices or seeing hallucinations.  He has not taken his HCTZ yet.   He eats a lot of hotdogs. He said he wasn't much of a cooker.   He likes to watch [employer] with staff there, they have been working with him one on one and in group lessons better eating habits.  Staff reports they do take him to get groceries as well and try to persuade him to choose better foods.  They report his diet consists of hotdogs and raman noodles.  The clients there have to report to staff when they need to be txp somewhere, so I will hit on that next time too.  I did not want to overwhelm him on our first visit.  Also mentioned a pill box and for him to think on that and will discuss again at next visit.   BP (!) 136/94    Pulse 80    Resp 18    SpO2  98%  CBG PTA-108 CBG EMS-187  Patient Care Team: Kerin Perna, NP as PCP - General (Internal Medicine) Patient, No Pcp Per (Inactive) (General Practice)  Patient Active Problem List   Diagnosis Date Noted   Respiratory failure ([place])    DKA (diabetic ketoacidoses) 10/10/2016   Schizoaffective disorder ([place]) 09/20/2014   Hallucinations 09/20/2014   Generalized anxiety disorder 08/01/2014   Autism spectrum disorder 07/29/2014    Current Outpatient Medications:    Accu-Chek FastClix Lancets MISC, Use as directed three times daily, Disp: 102 each, Rfl: 12   blood glucose meter kit and supplies, Dispense based on patient and insurance preference. Use up to four times daily as directed. (FOR ICD-10 E10.9, E11.9)., Disp: 1 each, Rfl: 0   Blood Glucose Monitoring Suppl (ACCU-CHEK AVIVA PLUS) w/Device KIT, See admin instructions., Disp: , Rfl: 1   glipiZIDE (GLUCOTROL) 10 MG tablet, Take 1 tablet (10 mg total) by mouth 2 (two) times daily before a meal., Disp: 180 tablet, Rfl: 1   glucose blood (ACCU-CHEK AVIVA PLUS) test strip, Use TID, Disp: 100 each, Rfl: 11   hydrochlorothiazide (HYDRODIURIL) 25 MG tablet, Take 1 tablet (25 mg total) by mouth daily.,  Disp: 90 tablet, Rfl: 1   Insulin Pen Needle 29G X 12.7MM MISC, Insulin administration 5  times daily, Disp: 150 each, Rfl: 11   Lancets Misc. (ACCU-CHEK FASTCLIX LANCET) KIT, Use as directed three times daily, Disp: 1 kit, Rfl: 0   risperiDONE (RISPERDAL) 2 MG tablet, Take 2 mg by mouth 3 (three) times daily., Disp: , Rfl:    risperiDONE microspheres (RISPERDAL CONSTA) 25 MG injection, Inject 25 mg into the muscle every 14 (fourteen) days., Disp: , Rfl:    benztropine (COGENTIN) 0.5 MG tablet, Take 0.5 mg by mouth 2 (two) times daily. (Patient not taking: Reported on 05/14/2021), Disp: , Rfl:    cholecalciferol (VITAMIN D3) 10 MCG (400 UNIT) TABS tablet, Take 1 tablet (400 Units total) by mouth daily. (Patient not taking: Reported on  06/04/2021), Disp: 90 tablet, Rfl: 3   fenofibrate (TRICOR) 145 MG tablet, Take 1 tablet (145 mg total) by mouth daily. (Patient not taking: Reported on 06/04/2021), Disp: 90 tablet, Rfl: 1   rosuvastatin (CRESTOR) 40 MG tablet, Take 1 tablet (40 mg total) by mouth daily. (Patient not taking: Reported on 06/04/2021), Disp: 90 tablet, Rfl: 1   sitaGLIPtin-metformin (JANUMET) 50-1000 MG tablet, Take 1 tablet by mouth 2 (two) times daily with a meal. (Patient not taking: Reported on 06/04/2021), Disp: 180 tablet, Rfl: 0 No Known Allergies    Social History   Socioeconomic History   Marital status: Single    Spouse name: Not on file   Number of children: Not on file   Years of education: Not on file   Highest education level: Not on file  Occupational History   Not on file  Tobacco Use   Smoking status: Never   Smokeless tobacco: Never  Substance and Sexual Activity   Alcohol use: No   Drug use: No   Sexual activity: Not Currently  Other Topics Concern   Not on file  Social History Narrative   ** Merged History Encounter **       Social Determinants of Health   Financial Resource Strain: Not on file  Food Insecurity: Not on file  Transportation Needs: Not on file  Physical Activity: Not on file  Stress: Not on file  Social Connections: Not on file  Intimate Partner Violence: Not on file    Physical Exam      Future Appointments  Date Time Provider [place]  06/12/2021 10:00 AM Tresa Endo, RPH-CPP [facility] None       Marylouise Stacks, [place] Paramedic  06/04/21

## 2021-06-12 ENCOUNTER — Ambulatory Visit: Payer: Medicaid Other | Attending: Primary Care | Admitting: Pharmacist

## 2021-06-12 ENCOUNTER — Encounter: Payer: Self-pay | Admitting: Pharmacist

## 2021-06-12 ENCOUNTER — Telehealth (HOSPITAL_COMMUNITY): Payer: Self-pay

## 2021-06-12 DIAGNOSIS — Z79899 Other long term (current) drug therapy: Secondary | ICD-10-CM | POA: Diagnosis not present

## 2021-06-12 DIAGNOSIS — F259 Schizoaffective disorder, unspecified: Secondary | ICD-10-CM | POA: Diagnosis not present

## 2021-06-12 DIAGNOSIS — F411 Generalized anxiety disorder: Secondary | ICD-10-CM | POA: Insufficient documentation

## 2021-06-12 DIAGNOSIS — F840 Autistic disorder: Secondary | ICD-10-CM | POA: Insufficient documentation

## 2021-06-12 DIAGNOSIS — E119 Type 2 diabetes mellitus without complications: Secondary | ICD-10-CM | POA: Insufficient documentation

## 2021-06-12 DIAGNOSIS — Z7984 Long term (current) use of oral hypoglycemic drugs: Secondary | ICD-10-CM | POA: Diagnosis not present

## 2021-06-12 DIAGNOSIS — Z794 Long term (current) use of insulin: Secondary | ICD-10-CM | POA: Insufficient documentation

## 2021-06-12 DIAGNOSIS — Z713 Dietary counseling and surveillance: Secondary | ICD-10-CM | POA: Diagnosis not present

## 2021-06-12 DIAGNOSIS — E781 Pure hyperglyceridemia: Secondary | ICD-10-CM | POA: Insufficient documentation

## 2021-06-12 NOTE — Progress Notes (Signed)
S:     No chief complaint on file.   Jacob Price is a 36 y.o. male who presents for diabetes evaluation, education, and management. PMH is significant for T2DM w/ hx of DKA, GAD, schizoaffective disorder, and autism. Patient was referred and last seen by Primary Care Provider, [name], on 05/22/2020. I also assisted the patient at that visit via [employer] Teams.   Today, he arrives in good spirits and presents without assistance. He coordinates his transportation and is able to take his medications with help from [place].   Patient reports Diabetes was diagnosed in 2018. At that time, he presented to [facility] ED after being found down by his mother. Found to be in DKA and was dx with T2DM. Since then, he has been on insulin + oral agents at times and on oral medications only at other times. A1c has ranged from 5.1 to 5.9 since 2019, with his most recent result being >15 on 05/14/2021. I reviewed his notes and it appears that he was not taking medication prior to that visit. Janumet and glipizide were restarted. He was seen again by his PCP at the end of January and was reeducated concerning DM, medication adherence, and home CBG monitoring.  Family/Social History:  Fhx: no pertinent positives listed on his profile Tobacco: never smoker  Alcohol:   Current diabetes medications include: Janumet 50-1000 mg BID, glipizide 10 mg BID Current hypertension medications include: HCTZ 25 mg daily  Current hyperlipidemia medications include: rosuvastatin 40 mg daily, fenofibrate 145 mg daily   Patient states that he is taking glipizide but is not taking the Janumet, rosuvastatin or fenofibrate. He has not picked these up from [place] yet. He is taking his HCTZ.   Patient denies hypoglycemic events.  Patient denies polyuria. Patient denies neuropathy (nerve pain). Patient reports improvement visual changes. Patient denies self foot exams.   Patient reported dietary habits: Eats  3 meals/day Breakfast: scrambled eggs  Lunch: talapia, Hamburger Helper Dinner: talapia with mixed vegetables, Hamburger Helper  Snacks: cheese crackers Drinks: water, diet Coke   Patient-reported exercise habits: none. Does plan to get on the treadmill in his apartment complex.    O:  Physical Exam  ROS  Brings his Accu Chek Guide Me from home. Home levels are as follows:  2/15: 112 2/15: 131  2/14: 127 2/13: 136 2/12: 179  2/11: 109, 102, 120  2/10: 137, 106, 118  2/9: 115 2/8: 115 2/6: 99, 102, 122, 121  2/5: 110 2/4: 112, 130 1/31: 85  1/30: 90, 118, 136  1/28: 104, 183   Lab Results  Component Value Date   HGBA1C  05/14/2021     Comment:     a1c too high to read.. code 106= A1c greater than 15   There were no vitals filed for this visit.  Lipid Panel     Component Value Date/Time   CHOL 226 (H) 05/14/2021 0925   TRIG 972 (HH) 05/14/2021 0925   HDL 29 (L) 05/14/2021 0925   CHOLHDL 7.8 (H) 05/14/2021 0925   LDLCALC Comment (A) 05/14/2021 0925    Clinical Atherosclerotic Cardiovascular Disease (ASCVD): No  The ASCVD Risk score (Arnett DK, et al., 2019) failed to calculate for the following reasons:   The 2019 ASCVD risk score is only valid for ages 100 to 37    A/P: Diabetes longstanding currently uncontrolled based on A1c, however, recent home CBGs reveal drastic improvement. Commended patient on this! He is also feeling  better from a symptom standpoint as well. Patient is able to verbalize appropriate hypoglycemia management plan. Medication adherence appears suboptimal. Because of his home sugar levels, we may need to decrease or stop glipizide once he resumes Janumet. He voices understanding of this and will call before his scheduled follow-up with me if he develops hypoglycemia at home.  -Continued current regimen. Encouraged patient to pick-up his Janumet.  -Consider dose decrease or discontinuation of glipizide on follow-up.  -Extensively discussed  pathophysiology of diabetes, recommended lifestyle interventions, dietary effects on blood sugar control.  -Counseled on s/sx of and management of hypoglycemia.  -Next A1c anticipated 07/2021.   ASCVD risk - primary prevention in patient with diabetes. Last LDL could not be calculated d/t high TG level. Encouraged pt to start his statin and fenofibrate. He has a hx of pancreatitis and his hypertriglyceridemia increases this risk. For now, I think it is appropriate for him to take rosuvastatin and fenofibrate. 10-year ASCVD risk score cannot be calculated d/t his age. -Continued current regimen. Encouraged patient to start taking his medications as prescribed.    Written patient instructions provided. Patient verbalized understanding of treatment plan. Total time in face to face counseling 30 minutes.   Follow up pharmacist in 2-3 weeks.   Butch Penny, PharmD, [place]y [place], CPP Clinical Pharmacist [facility] [phone]

## 2021-06-12 NOTE — Telephone Encounter (Signed)
Attempted to call pt regarding visit this week, his phone goes straight to VM to both lines listed.    Marylouise Stacks, EMT-Paramedic  06/12/21

## 2021-06-17 ENCOUNTER — Telehealth (HOSPITAL_COMMUNITY): Payer: Self-pay

## 2021-06-18 ENCOUNTER — Telehealth (INDEPENDENT_AMBULATORY_CARE_PROVIDER_SITE_OTHER): Payer: Self-pay | Admitting: Primary Care

## 2021-06-18 ENCOUNTER — Other Ambulatory Visit (INDEPENDENT_AMBULATORY_CARE_PROVIDER_SITE_OTHER): Payer: Self-pay | Admitting: Primary Care

## 2021-06-18 DIAGNOSIS — I10 Essential (primary) hypertension: Secondary | ICD-10-CM

## 2021-06-18 DIAGNOSIS — E0800 Diabetes mellitus due to underlying condition with hyperosmolarity without nonketotic hyperglycemic-hyperosmolar coma (NKHHC): Secondary | ICD-10-CM

## 2021-06-18 DIAGNOSIS — E119 Type 2 diabetes mellitus without complications: Secondary | ICD-10-CM

## 2021-06-18 NOTE — Telephone Encounter (Signed)
Attempted to contact pt this week for a home visit, no answer to home phone line and unable to LVM and his cell phone goes straight to VM.   Will try again tomor.     [name], EMT-Paramedic  06/18/21

## 2021-06-18 NOTE — Telephone Encounter (Signed)
Please also send refills for accu check strips and lancets.

## 2021-06-19 ENCOUNTER — Other Ambulatory Visit (HOSPITAL_COMMUNITY): Payer: Self-pay

## 2021-06-19 NOTE — Progress Notes (Signed)
Paramedicine Encounter    Patient ID: Jacob Price, male    DOB: Sep 03, 1985, 36 y.o.   MRN: [id number]  Pt reports doing ok, he denies sob, no dizziness, no c/p.  He has yet to go to pharmacy to p/u the fenofibrate, rosuvastatin and the janumet.  He is going to call [facility] and make sure they are ready and get a ride to go get them.   CBG's running between 95-130's. The highest I seen was 307-261 and that was only once, next highest was 183, but majority was below 150.   He says he is trying to cut back on the high sodium foods he is eating. I advised him when I see him next week he needs to have all his meds.  I gave him a list of what he had missing per Epic list.   Will f/u next week.   BP 138/78    Pulse 90    Resp 18    SpO2 98%    Patient Care Team: Kerin Perna, NP as PCP - General (Internal Medicine) Patient, No Pcp Per (Inactive) (General Practice)  Patient Active Problem List   Diagnosis Date Noted   Respiratory failure ([place])    DKA (diabetic ketoacidoses) 10/10/2016   Schizoaffective disorder ([place]) 09/20/2014   Hallucinations 09/20/2014   Generalized anxiety disorder 08/01/2014   Autism spectrum disorder 07/29/2014    Current Outpatient Medications:    Accu-Chek FastClix Lancets MISC, Use as directed three times daily, Disp: 102 each, Rfl: 12   blood glucose meter kit and supplies, Dispense based on patient and insurance preference. Use up to four times daily as directed. (FOR ICD-10 E10.9, E11.9)., Disp: 1 each, Rfl: 0   Blood Glucose Monitoring Suppl (ACCU-CHEK AVIVA PLUS) w/Device KIT, See admin instructions., Disp: , Rfl: 1   cholecalciferol (VITAMIN D3) 10 MCG (400 UNIT) TABS tablet, Take 1 tablet (400 Units total) by mouth daily., Disp: 90 tablet, Rfl: 3   fenofibrate (TRICOR) 145 MG tablet, Take 1 tablet (145 mg total) by mouth daily., Disp: 90 tablet, Rfl: 1   glipiZIDE (GLUCOTROL) 10 MG tablet, Take 1 tablet (10 mg total) by mouth 2  (two) times daily before a meal., Disp: 180 tablet, Rfl: 1   glucose blood (ACCU-CHEK AVIVA PLUS) test strip, Use TID, Disp: 100 each, Rfl: 11   hydrochlorothiazide (HYDRODIURIL) 25 MG tablet, Take 1 tablet (25 mg total) by mouth daily., Disp: 90 tablet, Rfl: 1   Insulin Pen Needle 29G X 12.7MM MISC, Insulin administration 5  times daily, Disp: 150 each, Rfl: 11   Lancets Misc. (ACCU-CHEK FASTCLIX LANCET) KIT, Use as directed three times daily, Disp: 1 kit, Rfl: 0   risperiDONE (RISPERDAL) 2 MG tablet, Take 2 mg by mouth 3 (three) times daily., Disp: , Rfl:    risperiDONE microspheres (RISPERDAL CONSTA) 25 MG injection, Inject 25 mg into the muscle every 14 (fourteen) days., Disp: , Rfl:    benztropine (COGENTIN) 0.5 MG tablet, Take 0.5 mg by mouth 2 (two) times daily. (Patient not taking: Reported on 05/14/2021), Disp: , Rfl:    rosuvastatin (CRESTOR) 40 MG tablet, Take 1 tablet (40 mg total) by mouth daily. (Patient not taking: Reported on 06/04/2021), Disp: 90 tablet, Rfl: 1   sitaGLIPtin-metformin (JANUMET) 50-1000 MG tablet, Take 1 tablet by mouth 2 (two) times daily with a meal. (Patient not taking: Reported on 06/04/2021), Disp: 180 tablet, Rfl: 0 No Known Allergies    Social History   Socioeconomic History  Marital status: Single    Spouse name: Not on file   Number of children: Not on file   Years of education: Not on file   Highest education level: Not on file  Occupational History   Not on file  Tobacco Use   Smoking status: Never   Smokeless tobacco: Never  Substance and Sexual Activity   Alcohol use: No   Drug use: No   Sexual activity: Not Currently  Other Topics Concern   Not on file  Social History Narrative   ** Merged History Encounter **       Social Determinants of Health   Financial Resource Strain: Not on file  Food Insecurity: Not on file  Transportation Needs: Not on file  Physical Activity: Not on file  Stress: Not on file  Social Connections: Not on  file  Intimate Partner Violence: Not on file    Physical Exam      Future Appointments  Date Time Provider [place]  06/26/2021  3:00 PM Tresa Endo, RPH-CPP [facility] None       Marylouise Stacks, [place] Paramedic  06/19/21

## 2021-06-21 ENCOUNTER — Other Ambulatory Visit: Payer: Self-pay

## 2021-06-26 ENCOUNTER — Other Ambulatory Visit (INDEPENDENT_AMBULATORY_CARE_PROVIDER_SITE_OTHER): Payer: Self-pay | Admitting: Primary Care

## 2021-06-26 ENCOUNTER — Ambulatory Visit: Payer: Medicaid Other | Attending: Primary Care | Admitting: Pharmacist

## 2021-06-26 ENCOUNTER — Encounter: Payer: Self-pay | Admitting: Pharmacist

## 2021-06-26 DIAGNOSIS — Z7984 Long term (current) use of oral hypoglycemic drugs: Secondary | ICD-10-CM | POA: Insufficient documentation

## 2021-06-26 DIAGNOSIS — F259 Schizoaffective disorder, unspecified: Secondary | ICD-10-CM | POA: Insufficient documentation

## 2021-06-26 DIAGNOSIS — E119 Type 2 diabetes mellitus without complications: Secondary | ICD-10-CM | POA: Diagnosis present

## 2021-06-26 DIAGNOSIS — E0800 Diabetes mellitus due to underlying condition with hyperosmolarity without nonketotic hyperglycemic-hyperosmolar coma (NKHHC): Secondary | ICD-10-CM

## 2021-06-26 DIAGNOSIS — Z79899 Other long term (current) drug therapy: Secondary | ICD-10-CM | POA: Diagnosis not present

## 2021-06-26 DIAGNOSIS — F840 Autistic disorder: Secondary | ICD-10-CM | POA: Diagnosis not present

## 2021-06-26 DIAGNOSIS — Z794 Long term (current) use of insulin: Secondary | ICD-10-CM

## 2021-06-26 MED ORDER — ACCU-CHEK AVIVA PLUS VI STRP: ORAL_STRIP | 11 refills | Status: DC

## 2021-06-26 MED ORDER — ACCU-CHEK FASTCLIX LANCET KIT: PACK | 0 refills | Status: AC

## 2021-06-26 NOTE — Telephone Encounter (Signed)
Sent to PCP ?

## 2021-06-26 NOTE — Progress Notes (Signed)
? ? ?S:    ? ?No chief complaint on file. ? ?Jacob Price is a 36 y.o. male who presents for diabetes evaluation, education, and management. PMH is significant for T2DM w/ hx of DKA, GAD, schizoaffective disorder, and autism. Patient was referred and last seen by Primary Care Provider, [name], on 05/22/2020. I saw him on 06/12/2021 and pt showed improvement.  ? ?Today, he arrives in good spirits and presents without assistance. He coordinates his transportation and is able to take his medications with help from [place]. He picked up his pill packs from [place] and he is compliant with his Janumet and glipizide.  ? ?Family/Social History:  ?Fhx: no pertinent positives listed on his profile ?Tobacco: never smoker  ?Alcohol: none reported  ? ?Current diabetes medications include: Janumet 50-1000 mg BID, glipizide 10 mg BID ? ?Patient states that he is taking all oral medications. I can see his Janumet, glipizide, fenofibrate, HCTZ, and rosuvastatin. His risperidone and benztropine are not in the pill pack.  ? ?Patient denies hypoglycemic events. ? ?Patient denies polyuria. ?Patient denies neuropathy (nerve pain). ?Patient reports improvement visual changes. ?Patient denies self foot exams.  ? ?Patient reported dietary habits: Eats 3 meals/day ?Breakfast: scrambled eggs  ?Lunch: Georg[employer] ?Dinner: talapia with mixed vegetables, [employer] Helper  ?Snacks: cheese crackers ?Drinks: water, diet Coke  ? ?Patient-reported exercise habits: none. Does plan to get on the treadmill in his apartment complex.  ? ? ?O:  ?Physical Exam ? ?ROS ? ?Brings his Accu Chek Guide Me from home. Home levels are as follows:  ?2/19: 95 ?2/24: 139  ?2/24: 89 ?2/27: 130 ?2/27: 87 ?2/28: 125 ?3/1: 125 ?3/1: 122 ?3/1: 90 ? ?Averages:  ?7day: 113 ?14day: 111 ?30day: 116 ? ?90day: 134 ? ?Lab Results  ?Component Value Date  ? HGBA1C  05/14/2021  ?   Comment:  ?   a1c too high to read.. code 106= A1c greater than 15   ? ?There were no vitals filed for this visit. ? ?Lipid Panel  ?   ?Component Value Date/Time  ? CHOL 226 (H) 05/14/2021 0925  ? TRIG 972 (HH) 05/14/2021 0925  ? HDL 29 (L) 05/14/2021 0925  ? CHOLHDL 7.8 (H) 05/14/2021 0925  ? LDLCALC Comment (A) 05/14/2021 0925  ? ? ?Clinical Atherosclerotic Cardiovascular Disease (ASCVD): No  ?The ASCVD Risk score (Arnett DK, et al., 2019) failed to calculate for the following reasons: ?  The 2019 ASCVD risk score is only valid for ages 4 to 61  ? ? ?A/P: ?Diabetes longstanding currently uncontrolled based on A1c, however, recent home CBGs reveal drastic improvement. Averages are at goal. Commended patient on this! He is also feeling better from a symptom standpoint as well. Patient is able to verbalize appropriate hypoglycemia management plan. Medication adherence appears appropriate. Because of his home sugar levels, we may need to decrease or stop glipizide once he resumes Janumet. He voices understanding to decrease glipizide to 5 mg (1/2 tablet) BID if hypoglycemia occurs.  ?-Continued current regimen. Encouraged patient to pick-up his Janumet.  ?-Consider dose decrease or discontinuation of glipizide on follow-up.  ?-Extensively discussed pathophysiology of diabetes, recommended lifestyle interventions, dietary effects on blood sugar control.  ?-Counseled on s/sx of and management of hypoglycemia.  ?-Next A1c anticipated 07/2021.  ? ?Written patient instructions provided. Patient verbalized understanding of treatment plan. Total time in face to face counseling 30 minutes.   ?Follow up pharmacist in 3-4 weeks.  ? ?Butch Penny, PharmD, BCACP,  CPP ?Clinical Pharmacist ?[facility] ?[phone] ? ? ?

## 2021-06-26 NOTE — Telephone Encounter (Signed)
Sent to pharmacy 

## 2021-06-26 NOTE — Telephone Encounter (Signed)
Patient called back needing Rx sent ASAP in for test strips and lancets please to  ?[address] [facility] - [place], [place] - [address] [place] Phone:  [phone]  ?Fax:  [phone]  ?  ? ?

## 2021-06-27 ENCOUNTER — Telehealth (INDEPENDENT_AMBULATORY_CARE_PROVIDER_SITE_OTHER): Payer: Self-pay | Admitting: Primary Care

## 2021-06-27 ENCOUNTER — Other Ambulatory Visit (HOSPITAL_COMMUNITY): Payer: Self-pay

## 2021-06-27 NOTE — Telephone Encounter (Signed)
Sharyn Lull with [facility] calling to get a couple of things changed- if OK. ?Pt uses accu check guide test strips. It was sent over as glucose blood (ACCU-CHEK AVIVA PLUS) test strip.  Ok to change? ? ?2.  Rx for lancets sent over as a  ""(ACCU-CHEK FASTCLIX LANCET) Kit. ?Pt doesn't need a kit.  OK to change to lancets only? ?Please reply to ?[place] [phone] ?

## 2021-06-27 NOTE — Progress Notes (Signed)
Paramedicine Encounter ? ? ? Patient ID: Jacob Price, male    DOB: 05-01-1985, 36 y.o.   MRN: [id number] ? ? ?BP 130/80   Pulse 84   Resp 16   SpO2 98%  ? ?Pt reports he is doing good. He denies increased sob, no dizziness, no c/p.  ?He went to get his meds from pharmacy-he had bubble packs but all of his meds are not included-so he is taking some out of bottles and plus bubble packs--he is taking risperdone, HCTZ and glipizide from bottles.  ?Will see if pharmacy is able to place all meds in bubble pack for him for next time.  ? ?CBG's ranging between 80-130's  ?A couple of spikes of sugars but no more than 200 when I checked his glucometer.  ?He said he is doing better with food choices.  ?Will f/u next week.  ? ?Patient Care Team: ?Kerin Perna, NP as PCP - General (Internal Medicine) ?Patient, No Pcp Per (Inactive) (General Practice) ? ?Patient Active Problem List  ? Diagnosis Date Noted  ? Respiratory failure ([place])   ? DKA (diabetic ketoacidoses) 10/10/2016  ? Schizoaffective disorder ([place]) 09/20/2014  ? Hallucinations 09/20/2014  ? Generalized anxiety disorder 08/01/2014  ? Autism spectrum disorder 07/29/2014  ? ? ?Current Outpatient Medications:  ?  Accu-Chek FastClix Lancets MISC, Use as directed three times daily, Disp: 102 each, Rfl: 12 ?  blood glucose meter kit and supplies, Dispense based on patient and insurance preference. Use up to four times daily as directed. (FOR ICD-10 E10.9, E11.9)., Disp: 1 each, Rfl: 0 ?  Blood Glucose Monitoring Suppl (ACCU-CHEK AVIVA PLUS) w/Device KIT, See admin instructions., Disp: , Rfl: 1 ?  fenofibrate (TRICOR) 145 MG tablet, Take 1 tablet (145 mg total) by mouth daily., Disp: 90 tablet, Rfl: 1 ?  glipiZIDE (GLUCOTROL) 10 MG tablet, Take 1 tablet (10 mg total) by mouth 2 (two) times daily before a meal., Disp: 180 tablet, Rfl: 1 ?  glucose blood (ACCU-CHEK AVIVA PLUS) test strip, Use TID, Disp: 100 each, Rfl: 11 ?  hydrochlorothiazide (HYDRODIURIL)  25 MG tablet, TAKE 1 TABLET BY MOUTH DAILY, Disp: 90 tablet, Rfl: 1 ?  Insulin Pen Needle 29G X 12.7MM MISC, Insulin administration 5  times daily, Disp: 150 each, Rfl: 11 ?  Lancets Misc. (ACCU-CHEK FASTCLIX LANCET) KIT, Use as directed three times daily, Disp: 1 kit, Rfl: 0 ?  risperiDONE (RISPERDAL) 2 MG tablet, Take 2 mg by mouth 3 (three) times daily., Disp: , Rfl:  ?  risperiDONE microspheres (RISPERDAL CONSTA) 25 MG injection, Inject 25 mg into the muscle every 14 (fourteen) days., Disp: , Rfl:  ?  rosuvastatin (CRESTOR) 40 MG tablet, Take 1 tablet (40 mg total) by mouth daily., Disp: 90 tablet, Rfl: 1 ?  sitaGLIPtin-metformin (JANUMET) 50-1000 MG tablet, Take 1 tablet by mouth 2 (two) times daily with a meal., Disp: 180 tablet, Rfl: 0 ?  benztropine (COGENTIN) 0.5 MG tablet, Take 0.5 mg by mouth 2 (two) times daily. (Patient not taking: Reported on 05/14/2021), Disp: , Rfl:  ?  cholecalciferol (VITAMIN D3) 10 MCG (400 UNIT) TABS tablet, Take 1 tablet (400 Units total) by mouth daily., Disp: 90 tablet, Rfl: 3 ?No Known Allergies ? ? ? ?Social History  ? ?Socioeconomic History  ? Marital status: Single  ?  Spouse name: Not on file  ? Number of children: Not on file  ? Years of education: Not on file  ? Highest education level: Not on file  ?  Occupational History  ? Not on file  ?Tobacco Use  ? Smoking status: Never  ? Smokeless tobacco: Never  ?Substance and Sexual Activity  ? Alcohol use: No  ? Drug use: No  ? Sexual activity: Not Currently  ?Other Topics Concern  ? Not on file  ?Social History Narrative  ? ** Merged History Encounter **  ?    ? ?Social Determinants of Health  ? ?Financial Resource Strain: Not on file  ?Food Insecurity: Not on file  ?Transportation Needs: Not on file  ?Physical Activity: Not on file  ?Stress: Not on file  ?Social Connections: Not on file  ?Intimate Partner Violence: Not on file  ? ? ?Physical Exam ? ? ? ? ? ?Future Appointments  ?Date Time Provider [place]  ?07/25/2021   2:30 PM Tresa Endo, RPH-CPP [facility] None  ? ? ? ? ? ?Marylouise Stacks, [place] ?[phone] ?Community Health Paramedic  ?06/27/21 ?

## 2021-06-27 NOTE — Telephone Encounter (Signed)
Please resend the correct supplies.  ?

## 2021-07-11 ENCOUNTER — Other Ambulatory Visit (HOSPITAL_COMMUNITY): Payer: Self-pay

## 2021-07-11 NOTE — Progress Notes (Signed)
Paramedicine Encounter ? ? ? Patient ID: Jacob Price, male    DOB: Aug 27, 1985, 36 y.o.   MRN: [id number] ? ? ?Pt reports he is doing ok.  ?Per his glucometer his cbg's are ranging pretty good-hihg 90s-130s, a few higher ones than that but not over 200.  ? ?I think he is self dosing with the glipizide-he said when his cbg's are lower (normal) he doesn't take the glipizide.  ? ?He has the glipizide, hctz and rhe risperidone in separate bottles. Will see if pharmacy can add them all together for next time.  ? ?The risperidone bottle was LF 10/21 and has a ton left in there, will check with pharmacy to see if this is accurate and also the glipizide was filled on 1/23 and has a lot left in it.  ?The hctz 8/22 and has a lot left as well. -will ask about this one as well-I dont see HTN in his med hx, so will ask what this is being used for.  ? ?He takes his bubble pack medicine when he eats.  ?Sent Pharm Daisy Blossom message to help me confirm dates and to better determine his compliance.  ? ?CBG PTA-108 ?BP 110/78   Pulse 100   Resp 18   SpO2 98%  ? ? ?Patient Care Team: ?Kerin Perna, NP as PCP - General (Internal Medicine) ?Patient, No Pcp Per (Inactive) (General Practice) ? ?Patient Active Problem List  ? Diagnosis Date Noted  ? Respiratory failure ([place])   ? DKA (diabetic ketoacidoses) 10/10/2016  ? Schizoaffective disorder ([place]) 09/20/2014  ? Hallucinations 09/20/2014  ? Generalized anxiety disorder 08/01/2014  ? Autism spectrum disorder 07/29/2014  ? ? ?Current Outpatient Medications:  ?  Accu-Chek FastClix Lancets MISC, Use as directed three times daily, Disp: 102 each, Rfl: 12 ?  blood glucose meter kit and supplies, Dispense based on patient and insurance preference. Use up to four times daily as directed. (FOR ICD-10 E10.9, E11.9)., Disp: 1 each, Rfl: 0 ?  Blood Glucose Monitoring Suppl (ACCU-CHEK AVIVA PLUS) w/Device KIT, See admin instructions., Disp: , Rfl: 1 ?  fenofibrate (TRICOR) 145 MG  tablet, Take 1 tablet (145 mg total) by mouth daily., Disp: 90 tablet, Rfl: 1 ?  glipiZIDE (GLUCOTROL) 10 MG tablet, Take 1 tablet (10 mg total) by mouth 2 (two) times daily before a meal., Disp: 180 tablet, Rfl: 1 ?  glucose blood (ACCU-CHEK AVIVA PLUS) test strip, Use TID, Disp: 100 each, Rfl: 11 ?  hydrochlorothiazide (HYDRODIURIL) 25 MG tablet, TAKE 1 TABLET BY MOUTH DAILY, Disp: 90 tablet, Rfl: 1 ?  Insulin Pen Needle 29G X 12.7MM MISC, Insulin administration 5  times daily, Disp: 150 each, Rfl: 11 ?  Lancets Misc. (ACCU-CHEK FASTCLIX LANCET) KIT, Use as directed three times daily, Disp: 1 kit, Rfl: 0 ?  risperiDONE (RISPERDAL) 2 MG tablet, Take 2 mg by mouth 3 (three) times daily., Disp: , Rfl:  ?  risperiDONE microspheres (RISPERDAL CONSTA) 25 MG injection, Inject 25 mg into the muscle every 14 (fourteen) days., Disp: , Rfl:  ?  rosuvastatin (CRESTOR) 40 MG tablet, Take 1 tablet (40 mg total) by mouth daily., Disp: 90 tablet, Rfl: 1 ?  sitaGLIPtin-metformin (JANUMET) 50-1000 MG tablet, Take 1 tablet by mouth 2 (two) times daily with a meal., Disp: 180 tablet, Rfl: 0 ?  benztropine (COGENTIN) 0.5 MG tablet, Take 0.5 mg by mouth 2 (two) times daily. (Patient not taking: Reported on 05/14/2021), Disp: , Rfl:  ?  cholecalciferol (VITAMIN  D3) 10 MCG (400 UNIT) TABS tablet, Take 1 tablet (400 Units total) by mouth daily. (Patient not taking: Reported on 06/27/2021), Disp: 90 tablet, Rfl: 3 ?No Known Allergies ? ? ? ?Social History  ? ?Socioeconomic History  ? Marital status: Single  ?  Spouse name: Not on file  ? Number of children: Not on file  ? Years of education: Not on file  ? Highest education level: Not on file  ?Occupational History  ? Not on file  ?Tobacco Use  ? Smoking status: Never  ? Smokeless tobacco: Never  ?Substance and Sexual Activity  ? Alcohol use: No  ? Drug use: No  ? Sexual activity: Not Currently  ?Other Topics Concern  ? Not on file  ?Social History Narrative  ? ** Merged History Encounter **   ?    ? ?Social Determinants of Health  ? ?Financial Resource Strain: Not on file  ?Food Insecurity: Not on file  ?Transportation Needs: Not on file  ?Physical Activity: Not on file  ?Stress: Not on file  ?Social Connections: Not on file  ?Intimate Partner Violence: Not on file  ? ? ?Physical Exam ? ? ? ? ? ?Future Appointments  ?Date Time Provider [place]  ?07/25/2021  2:30 PM Tresa Endo, RPH-CPP [facility] None  ? ? ? ? ? ?Marylouise Stacks, [place] ?[phone] ?Community Health Paramedic  ?07/11/21 ?

## 2021-07-25 ENCOUNTER — Ambulatory Visit: Payer: Medicaid Other | Attending: Primary Care | Admitting: Pharmacist

## 2021-07-25 DIAGNOSIS — F840 Autistic disorder: Secondary | ICD-10-CM | POA: Insufficient documentation

## 2021-07-25 DIAGNOSIS — Z7984 Long term (current) use of oral hypoglycemic drugs: Secondary | ICD-10-CM | POA: Diagnosis not present

## 2021-07-25 DIAGNOSIS — E119 Type 2 diabetes mellitus without complications: Secondary | ICD-10-CM | POA: Diagnosis present

## 2021-07-25 LAB — POCT GLYCOSYLATED HEMOGLOBIN (HGB A1C): HbA1c, POC (controlled diabetic range): 8.2 % — AB (ref 0.0–7.0)

## 2021-07-25 MED ORDER — EMPAGLIFLOZIN 10 MG PO TABS: 10.0000 mg | ORAL_TABLET | Freq: Every day | ORAL | 2 refills | Status: DC

## 2021-07-25 NOTE — Progress Notes (Signed)
? ? ?  S:    ? ?No chief complaint on file. ? ?Jacob Price is a 36 y.o. male who presents for diabetes evaluation, education, and management. PMH is significant for T2DM w/ hx of DKA, GAD, schizoaffective disorder, and autism. Patient was referred and last seen by Primary Care Provider, [name], on 05/22/2020. I saw him on 06/26/2021 and pt's home CBGs were at goal.  ? ?Today, he arrives in good spirits and presents without assistance. He coordinates his transportation and is able to take his medications with help from [place]. He picked up his pill packs from [place] and he is compliant with his Janumet. Of note, he admits today that he has not been taking glipizide recently.   ? ?Family/Social History:  ?Fhx: no pertinent positives listed on his profile ?Tobacco: never smoker  ?Alcohol: none reported  ? ?Current diabetes medications include: Janumet 50-1000 mg BID, glipizide 10 mg BID (not taking) ? ?Patient denies hypoglycemic events. ? ?Patient denies polyuria. ?Patient denies neuropathy (nerve pain). ?Patient reports improvement visual changes. ?Patient denies self foot exams.  ? ?Patient reported dietary habits: Eats 3 meals/day ?Breakfast: scrambled eggs  ?Lunch: Georg[employer] ?Dinner: talapia with mixed vegetables, [employer]  ?Snacks: cheese crackers ?Drinks: water, diet Coke  ? ?Patient-reported exercise habits: none. Does plan to get on the treadmill in his apartment complex.  ? ? ?O:  ?Physical Exam ? ?ROS ? ?No meter with him today but assures me that his blood sugars at home are better. Denies any readings >200 or <70.  ? ?Lab Results  ?Component Value Date  ? HGBA1C 8.2 (A) 07/25/2021  ? ?There were no vitals filed for this visit. ? ?Lipid Panel  ?   ?Component Value Date/Time  ? CHOL 226 (H) 05/14/2021 0925  ? TRIG 972 (HH) 05/14/2021 0925  ? HDL 29 (L) 05/14/2021 0925  ? CHOLHDL 7.8 (H) 05/14/2021 0925  ? LDLCALC Comment (A) 05/14/2021 0925  ? ? ?Clinical  Atherosclerotic Cardiovascular Disease (ASCVD): No  ?The ASCVD Risk score (Arnett DK, et al., 2019) failed to calculate for the following reasons: ?  The 2019 ASCVD risk score is only valid for ages 63 to 60  ? ? ?A/P: ?Diabetes longstanding. A1c shows drastic improvement since January with his current A1c today 8.2% (down from >15). Commended patient on this! He continue to feel better from a symptom standpoint as well. Patient is able to verbalize appropriate hypoglycemia management plan. Medication adherence appears appropriate. Will remove glipizide and start Jardiance in its place.  ?-Continued Janumet 50-1000 mg BID.  ?-Start Jardiance 10 mg daily.  ?-Stop glipizide.  ?-Extensively discussed pathophysiology of diabetes, recommended lifestyle interventions, dietary effects on blood sugar control.  ?-Counseled on s/sx of and management of hypoglycemia.  ?-Next A1c anticipated 07/2021.  ? ?Written patient instructions provided. Patient verbalized understanding of treatment plan. Total time in face to face counseling 30 minutes.   ? ?Follow up with PCP in 3-4 weeks.  ? ?Butch Penny, PharmD, BCACP, CPP ?Clinical Pharmacist ?[facility] ?[phone] ? ? ?

## 2021-07-27 ENCOUNTER — Encounter: Payer: Self-pay | Admitting: Pharmacist

## 2021-07-29 ENCOUNTER — Other Ambulatory Visit (HOSPITAL_COMMUNITY): Payer: Self-pay

## 2021-07-29 NOTE — Progress Notes (Signed)
Paramedicine Encounter ? ? ? Patient ID: Jacob Price, male    DOB: December 06, 1985, 36 y.o.   MRN: [id number] ? ?Pt reports he is doing good. His last office visit his A1C dropped from over 15 to 8.2. I praised him for that, he has done a great job at bringing that down.  ?Jardiance was added at last visit. He has not p/u yet. He said once he is done with the bubble pack of meds then the jardiance will be added to the bubble pack.  ?He is going to [facility] for a f/u and for the risperidone injection every 2 wks.  ?His CBG's on his meter running between 80-130.  ? ? ?Pt denies any sob, no dizziness, no c/p.  ?He goes back to see PCP in a couple wks.  ? ?BP 118/90   Pulse 72   Resp 18   SpO2 98%  ? ? ?Patient Care Team: ?Kerin Perna, NP as PCP - General (Internal Medicine) ?Patient, No Pcp Per (Inactive) (General Practice) ? ?Patient Active Problem List  ? Diagnosis Date Noted  ? Respiratory failure ([place])   ? DKA (diabetic ketoacidoses) 10/10/2016  ? Schizoaffective disorder ([place]) 09/20/2014  ? Hallucinations 09/20/2014  ? Generalized anxiety disorder 08/01/2014  ? Autism spectrum disorder 07/29/2014  ? ? ?Current Outpatient Medications:  ?  Accu-Chek FastClix Lancets MISC, Use as directed three times daily, Disp: 102 each, Rfl: 12 ?  blood glucose meter kit and supplies, Dispense based on patient and insurance preference. Use up to four times daily as directed. (FOR ICD-10 E10.9, E11.9)., Disp: 1 each, Rfl: 0 ?  Blood Glucose Monitoring Suppl (ACCU-CHEK AVIVA PLUS) w/Device KIT, See admin instructions., Disp: , Rfl: 1 ?  fenofibrate (TRICOR) 145 MG tablet, Take 1 tablet (145 mg total) by mouth daily., Disp: 90 tablet, Rfl: 1 ?  glucose blood (ACCU-CHEK AVIVA PLUS) test strip, Use TID, Disp: 100 each, Rfl: 11 ?  hydrochlorothiazide (HYDRODIURIL) 25 MG tablet, TAKE 1 TABLET BY MOUTH DAILY, Disp: 90 tablet, Rfl: 1 ?  Insulin Pen Needle 29G X 12.7MM MISC, Insulin administration 5  times daily, Disp:  150 each, Rfl: 11 ?  Lancets Misc. (ACCU-CHEK FASTCLIX LANCET) KIT, Use as directed three times daily, Disp: 1 kit, Rfl: 0 ?  risperiDONE (RISPERDAL) 2 MG tablet, Take 2 mg by mouth 3 (three) times daily., Disp: , Rfl:  ?  risperiDONE microspheres (RISPERDAL CONSTA) 25 MG injection, Inject 25 mg into the muscle every 14 (fourteen) days., Disp: , Rfl:  ?  rosuvastatin (CRESTOR) 40 MG tablet, Take 1 tablet (40 mg total) by mouth daily., Disp: 90 tablet, Rfl: 1 ?  sitaGLIPtin-metformin (JANUMET) 50-1000 MG tablet, Take 1 tablet by mouth 2 (two) times daily with a meal., Disp: 180 tablet, Rfl: 0 ?  benztropine (COGENTIN) 0.5 MG tablet, Take 0.5 mg by mouth 2 (two) times daily. (Patient not taking: Reported on 05/14/2021), Disp: , Rfl:  ?  cholecalciferol (VITAMIN D3) 10 MCG (400 UNIT) TABS tablet, Take 1 tablet (400 Units total) by mouth daily. (Patient not taking: Reported on 06/27/2021), Disp: 90 tablet, Rfl: 3 ?  empagliflozin (JARDIANCE) 10 MG TABS tablet, Take 1 tablet (10 mg total) by mouth daily before breakfast. (Patient not taking: Reported on 07/29/2021), Disp: 30 tablet, Rfl: 2 ?No Known Allergies ? ? ? ?Social History  ? ?Socioeconomic History  ? Marital status: Single  ?  Spouse name: Not on file  ? Number of children: Not on file  ?  Years of education: Not on file  ? Highest education level: Not on file  ?Occupational History  ? Not on file  ?Tobacco Use  ? Smoking status: Never  ? Smokeless tobacco: Never  ?Substance and Sexual Activity  ? Alcohol use: No  ? Drug use: No  ? Sexual activity: Not Currently  ?Other Topics Concern  ? Not on file  ?Social History Narrative  ? ** Merged History Encounter **  ?    ? ?Social Determinants of Health  ? ?Financial Resource Strain: Not on file  ?Food Insecurity: Not on file  ?Transportation Needs: Not on file  ?Physical Activity: Not on file  ?Stress: Not on file  ?Social Connections: Not on file  ?Intimate Partner Violence: Not on file  ? ? ?Physical  Exam ? ? ? ? ? ?Future Appointments  ?Date Time Provider [place]  ?08/19/2021  3:50 PM Kerin Perna, NP [facility] None  ? ? ? ? ? ?Marylouise Stacks, [place] ?[phone] ?Community Health Paramedic  ?07/29/21 ?

## 2021-08-12 ENCOUNTER — Other Ambulatory Visit (INDEPENDENT_AMBULATORY_CARE_PROVIDER_SITE_OTHER): Payer: Self-pay | Admitting: Primary Care

## 2021-08-12 DIAGNOSIS — E0800 Diabetes mellitus due to underlying condition with hyperosmolarity without nonketotic hyperglycemic-hyperosmolar coma (NKHHC): Secondary | ICD-10-CM

## 2021-08-12 NOTE — Telephone Encounter (Signed)
Requested Prescriptions  ?Pending Prescriptions Disp Refills  ?? Accu-Chek Softclix Lancets lancets [Pharmacy Med Name: Accu-Chek Softclix Lancets MISC] 100 each 5  ?  Sig: USE AS DIRECTED THREE TIMES DAILY  ?  ? Endocrinology: Diabetes - Testing Supplies Passed - 08/12/2021  9:23 AM  ?  ?  Passed - Valid encounter within last 12 months  ?  Recent Outpatient Visits   ?      ? 2 weeks ago Type 2 diabetes mellitus without complication, without long-term current use of insulin (HCC)  ? [facility] Lois Huxley, Cornelius Moras, RPH-CPP  ? 1 month ago Type 2 diabetes mellitus without complication, without long-term current use of insulin (HCC)  ? [facility] Lois Huxley, Cornelius Moras, RPH-CPP  ? 2 months ago Type 2 diabetes mellitus without complication, without long-term current use of insulin (HCC)  ? [facility] Lois Huxley, Cornelius Moras, RPH-CPP  ? 2 months ago Mixed hyperlipidemia  ? [facility] Grayce Sessions, NP  ? 3 months ago Type 2 diabetes mellitus without complication, without long-term current use of insulin (HCC)  ? [facility] Grayce Sessions, NP  ?  ?  ?Future Appointments   ?        ? In 1 week Grayce Sessions, NP [facility]  ?  ? ?  ?  ?  ? ?

## 2021-08-15 ENCOUNTER — Other Ambulatory Visit: Payer: Self-pay

## 2021-08-19 ENCOUNTER — Encounter (INDEPENDENT_AMBULATORY_CARE_PROVIDER_SITE_OTHER): Payer: Medicaid Other | Admitting: Primary Care

## 2021-08-21 ENCOUNTER — Other Ambulatory Visit (HOSPITAL_COMMUNITY): Payer: Self-pay

## 2021-08-21 NOTE — Progress Notes (Signed)
Paramedicine Encounter ? ? ? Patient ID: Jacob Price, male    DOB: 1985-11-17, 36 y.o.   MRN: [id number] ? ?Pt reports he is doing well. He denies increased sob, no dizziness, no c/p. His A1C has improved greatly. Congratulated him on that.  ?His CBG's are running good. His glipizide was stopped at last visit.  ?He has been doing much better taking meds. His HCTZ he sometimes forgets to take it-I asked him to ask pharmacy to place in pill packs so it will be easier for him to be compliant.  ?Living facility helps him with txp to appointments. They have cooking classes in house and workers there to help him make better choices in grocery items and food prep.  ?I feel he is eligible for d/c from paramedicine.  ?He is agreeable to plan. If anything should come up in the future he has my phone #.  ? ? ?CBG's85-110 ?BP 138/82   Pulse 87   Resp 18   SpO2 98%  ? ? ?Patient is now discharged from [facility].  Patient has/has not met the following goals: ? ?Yes :Patient expresses basic understanding of medications and what they are for ? ? [id number] is aware of who to call if they have medical concerns or if they need to schedule or change appts ? ?Yes :Patient has a PCP and has seen within the past year or has upcoming appt ?Yes :Patient has reliable access to getting their medications ?Yes :Patient has shown they are able to reorder medications reliably ?No :Patient has had admission in past 30 days- if yes how many? ?No :Patient has had admission in past 90 days- if yes how many? ? ?Discharge Comments: ? ? ? ? ? ?Patient Care Team: ?Kerin Perna, NP as PCP - General (Internal Medicine) ?Patient, No Pcp Per (Inactive) (General Practice) ? ?Patient Active Problem List  ? Diagnosis Date Noted  ? Respiratory failure ([place])   ? DKA (diabetic ketoacidoses) 10/10/2016  ? Schizoaffective disorder ([place]) 09/20/2014  ? Hallucinations 09/20/2014  ? Generalized anxiety disorder 08/01/2014  ?  Autism spectrum disorder 07/29/2014  ? ? ?Current Outpatient Medications:  ?  Accu-Chek Softclix Lancets lancets, USE AS DIRECTED THREE TIMES DAILY, Disp: 100 each, Rfl: 5 ?  blood glucose meter kit and supplies, Dispense based on patient and insurance preference. Use up to four times daily as directed. (FOR ICD-10 E10.9, E11.9)., Disp: 1 each, Rfl: 0 ?  Blood Glucose Monitoring Suppl (ACCU-CHEK AVIVA PLUS) w/Device KIT, See admin instructions., Disp: , Rfl: 1 ?  empagliflozin (JARDIANCE) 10 MG TABS tablet, Take 1 tablet (10 mg total) by mouth daily before breakfast., Disp: 30 tablet, Rfl: 2 ?  fenofibrate (TRICOR) 145 MG tablet, Take 1 tablet (145 mg total) by mouth daily., Disp: 90 tablet, Rfl: 1 ?  glucose blood (ACCU-CHEK AVIVA PLUS) test strip, Use TID, Disp: 100 each, Rfl: 11 ?  hydrochlorothiazide (HYDRODIURIL) 25 MG tablet, TAKE 1 TABLET BY MOUTH DAILY, Disp: 90 tablet, Rfl: 1 ?  Insulin Pen Needle 29G X 12.7MM MISC, Insulin administration 5  times daily, Disp: 150 each, Rfl: 11 ?  Lancets Misc. (ACCU-CHEK FASTCLIX LANCET) KIT, Use as directed three times daily, Disp: 1 kit, Rfl: 0 ?  risperiDONE (RISPERDAL) 2 MG tablet, Take 2 mg by mouth 3 (three) times daily., Disp: , Rfl:  ?  risperiDONE microspheres (RISPERDAL CONSTA) 25 MG injection, Inject 25 mg into the muscle every 14 (fourteen) days., Disp: , Rfl:  ?  rosuvastatin (CRESTOR) 40 MG tablet, Take 1 tablet (40 mg total) by mouth daily., Disp: 90 tablet, Rfl: 1 ?  sitaGLIPtin-metformin (JANUMET) 50-1000 MG tablet, Take 1 tablet by mouth 2 (two) times daily with a meal., Disp: 180 tablet, Rfl: 0 ?  benztropine (COGENTIN) 0.5 MG tablet, Take 0.5 mg by mouth 2 (two) times daily. (Patient not taking: Reported on 05/14/2021), Disp: , Rfl:  ?  cholecalciferol (VITAMIN D3) 10 MCG (400 UNIT) TABS tablet, Take 1 tablet (400 Units total) by mouth daily. (Patient not taking: Reported on 06/27/2021), Disp: 90 tablet, Rfl: 3 ?No Known Allergies ? ? ? ?Social History   ? ?Socioeconomic History  ? Marital status: Single  ?  Spouse name: Not on file  ? Number of children: Not on file  ? Years of education: Not on file  ? Highest education level: Not on file  ?Occupational History  ? Not on file  ?Tobacco Use  ? Smoking status: Never  ? Smokeless tobacco: Never  ?Substance and Sexual Activity  ? Alcohol use: No  ? Drug use: No  ? Sexual activity: Not Currently  ?Other Topics Concern  ? Not on file  ?Social History Narrative  ? ** Merged History Encounter **  ?    ? ?Social Determinants of Health  ? ?Financial Resource Strain: Not on file  ?Food Insecurity: Not on file  ?Transportation Needs: Not on file  ?Physical Activity: Not on file  ?Stress: Not on file  ?Social Connections: Not on file  ?Intimate Partner Violence: Not on file  ? ? ?Physical Exam ? ? ? ? ? ?Future Appointments  ?Date Time Provider [place]  ?08/29/2021  2:50 PM Kerin Perna, NP [facility] None  ? ? ? ? ? ?Marylouise Stacks, [place] ?[phone] ?Community Health Paramedic  ?08/21/21 ?

## 2021-08-22 ENCOUNTER — Telehealth: Payer: Self-pay

## 2021-08-22 ENCOUNTER — Other Ambulatory Visit (INDEPENDENT_AMBULATORY_CARE_PROVIDER_SITE_OTHER): Payer: Self-pay | Admitting: Primary Care

## 2021-08-22 DIAGNOSIS — I10 Essential (primary) hypertension: Secondary | ICD-10-CM

## 2021-08-22 NOTE — Telephone Encounter (Signed)
Message received from [name], EMT: ? ?He is ready for d/c from paramedicine. He is managing things much better. His A1C has improved greatly!!  ?The facility that he is at helps him with appointments, no issues getting/affording meds.  ?The employees there at facility have classes to show residents how to cook better, better recipes etc. And he participates in those.  ? ?I think he is ready for d/c and is doing great independently.   ?

## 2021-08-29 ENCOUNTER — Ambulatory Visit (INDEPENDENT_AMBULATORY_CARE_PROVIDER_SITE_OTHER): Payer: Medicaid Other | Admitting: Primary Care

## 2021-08-29 ENCOUNTER — Encounter (INDEPENDENT_AMBULATORY_CARE_PROVIDER_SITE_OTHER): Payer: Self-pay | Admitting: Primary Care

## 2021-08-29 VITALS — BP 111/79 | HR 114 | Temp 98.0°F | Ht 70.0 in | Wt 228.4 lb

## 2021-08-29 DIAGNOSIS — E119 Type 2 diabetes mellitus without complications: Secondary | ICD-10-CM | POA: Diagnosis not present

## 2021-08-29 DIAGNOSIS — I10 Essential (primary) hypertension: Secondary | ICD-10-CM | POA: Diagnosis not present

## 2021-08-29 DIAGNOSIS — Z794 Long term (current) use of insulin: Secondary | ICD-10-CM

## 2021-09-04 NOTE — Progress Notes (Signed)
? ?Subjective:  ?Patient ID: Jacob Price, male    DOB: December 25, 1985  Age: 36 y.o. MRN: [id number] ? ?CC: Follow-up (diabetes) ? ? ?HPI ?Jacob Price presents forFollow-up of diabetes. Patient does not check blood sugar at home ? ?Compliant with meds - Yes ?Checking CBGs? No ? Fasting avg -  ? Postprandial average -  ?Exercising regularly? - Yes ?Watching carbohydrate intake? - Yes ?Neuropathy ? - No ?Hypoglycemic events - No ? - Recovers with :  ? ?Pertinent ROS:  ?Polyuria - No ?Polydipsia - No ?Vision problems - No ? ?Medications as noted below. Taking them regularly without complication/adverse reaction being reported today.  ? ?History ?Jacob Price has a past medical history of Asperger's syndrome, Diabetes mellitus without complication ([place]), and Hypertension.  ? ?Jacob Price has no past surgical history on file.  ? ?His family history is not on file.Jacob Price reports that Jacob Price has never smoked. Jacob Price has never used smokeless tobacco. Jacob Price reports that Jacob Price does not drink alcohol and does not use drugs. ? ?Current Outpatient Medications on File Prior to Visit  ?Medication Sig Dispense Refill  ? Accu-Chek Softclix Lancets lancets USE AS DIRECTED THREE TIMES DAILY 100 each 5  ? benztropine (COGENTIN) 0.5 MG tablet Take 0.5 mg by mouth 2 (two) times daily.    ? blood glucose meter kit and supplies Dispense based on patient and insurance preference. Use up to four times daily as directed. (FOR ICD-10 E10.9, E11.9). 1 each 0  ? Blood Glucose Monitoring Suppl (ACCU-CHEK AVIVA PLUS) w/Device KIT See admin instructions.  1  ? cholecalciferol (VITAMIN D3) 10 MCG (400 UNIT) TABS tablet Take 1 tablet (400 Units total) by mouth daily. 90 tablet 3  ? empagliflozin (JARDIANCE) 10 MG TABS tablet Take 1 tablet (10 mg total) by mouth daily before breakfast. 30 tablet 2  ? fenofibrate (TRICOR) 145 MG tablet Take 1 tablet (145 mg total) by mouth daily. 90 tablet 1  ? glucose blood (ACCU-CHEK AVIVA PLUS) test strip Use TID 100 each 11  ?  hydrochlorothiazide (HYDRODIURIL) 25 MG tablet TAKE 1 TABLET BY MOUTH DAILY 90 tablet 1  ? Insulin Pen Needle 29G X 12.7MM MISC Insulin administration 5  times daily 150 each 11  ? Lancets Misc. (ACCU-CHEK FASTCLIX LANCET) KIT Use as directed three times daily 1 kit 0  ? risperiDONE (RISPERDAL) 2 MG tablet Take 2 mg by mouth 3 (three) times daily.    ? risperiDONE microspheres (RISPERDAL CONSTA) 25 MG injection Inject 25 mg into the muscle every 14 (fourteen) days.    ? rosuvastatin (CRESTOR) 40 MG tablet Take 1 tablet (40 mg total) by mouth daily. 90 tablet 1  ? sitaGLIPtin-metformin (JANUMET) 50-1000 MG tablet Take 1 tablet by mouth 2 (two) times daily with a meal. 180 tablet 0  ? ?No current facility-administered medications on file prior to visit.  ? ? ?ROS ?Comprehensive ROS Pertinent positive and negative noted in HPI   ? ?Objective:  ?BP 111/79 (BP Location: Right Arm, Patient Position: Sitting, Cuff Size: Normal)   Pulse (!) 114   Temp 98 ?F (36.7 ?C) (Oral)   Ht _0  (1.778 m)   Wt 228 lb 6.4 oz (103.6 kg)   SpO2 96%   BMI 32.77 kg/m?  ? ?BP Readings from Last 3 Encounters:  ?08/29/21 111/79  ?08/21/21 138/82  ?07/29/21 118/90  ? ? ?Wt Readings from Last 3 Encounters:  ?08/29/21 228 lb 6.4 oz (103.6 kg)  ?05/22/21 231 lb 6.4 oz (105 kg)  ?  05/14/21 233 lb 6.4 oz (105.9 kg)  ? ? ?Physical Exam ?Constitutional:   ?   Appearance: Normal appearance. Jacob Price is obese.  ?HENT:  ?   Head: Normocephalic.  ?   Right Ear: Tympanic membrane and external ear normal.  ?   Left Ear: Tympanic membrane and external ear normal.  ?   Nose: Nose normal.  ?Pulmonary:  ?   Effort: Pulmonary effort is normal.  ?   Breath sounds: Normal breath sounds.  ?Abdominal:  ?   General: Bowel sounds are normal. There is distension.  ?   Palpations: Abdomen is soft.  ?Musculoskeletal:     ?   General: Normal range of motion.  ?   Cervical back: Normal range of motion and neck supple.  ?Skin: ?   General: Skin is warm and dry.   ?Neurological:  ?   Mental Status: Jacob Price is alert.  ?Psychiatric:     ?   Mood and Affect: Mood normal.     ?   Behavior: Behavior normal.  ? ?Lab Results  ?Component Value Date  ? HGBA1C 8.2 (A) 07/25/2021  ? HGBA1C  05/14/2021  ?   Comment:  ?   a1c too high to read.. code 106= A1c greater than 15  ? HGBA1C 5.6 03/19/2020  ? ? ?Lab Results  ?Component Value Date  ? WBC 4.2 05/14/2021  ? HGB 16.0 05/14/2021  ? HCT 45.2 05/14/2021  ? PLT 316 05/14/2021  ? GLUCOSE 599 (HH) 05/14/2021  ? CHOL 226 (H) 05/14/2021  ? TRIG 972 (HH) 05/14/2021  ? HDL 29 (L) 05/14/2021  ? [place] Comment (A) 05/14/2021  ? ALT 15 05/14/2021  ? AST 16 05/14/2021  ? NA 133 (L) 05/14/2021  ? K 4.6 05/14/2021  ? CL 95 (L) 05/14/2021  ? CREATININE 0.75 (L) 05/14/2021  ? BUN 15 05/14/2021  ? CO2 20 05/14/2021  ? TSH 1.070 11/03/2016  ? HGBA1C 8.2 (A) 07/25/2021  ? ? ? ?Assessment & Plan:  ?Lillie was seen today for follow-up. ? ?Diagnoses and all orders for this visit: ? ?Type 2 diabetes mellitus without complication, with long-term current use of insulin ([place]) ?3 months ago A1C was uncontrolled > 15. Today with patient out reach program they have taught him how to cook , how to choose healthy choices and taking his medication. Total complete change Jacob Price is happy proud Jacob Price is able to take care of his self . PCP extremely proud and pleased Jacob Price knows Jacob Price is a diabetic and understands more . No changes   ? ?Hypertension, unspecified type ?Blood pressure is at goal goal of less than 130/80, low-sodium, DASH diet, medication compliance, 150 minutes of moderate intensity exercise per week. ?Discussed medication compliance, adverse effects.  ?  ?There are no diagnoses linked to this encounter. ?I am having Wayne Lucken maintain his risperiDONE microspheres, benztropine, risperiDONE, Accu-Chek Aviva Plus, Insulin Pen Needle, cholecalciferol, Janumet, blood glucose meter kit and supplies, rosuvastatin, fenofibrate, Accu-Chek FastClix Lancet, Accu-Chek Aviva  Plus, empagliflozin, Accu-Chek Softclix Lancets, and hydrochlorothiazide. ? ?No orders of the defined types were placed in this encounter. ? ? ? ?Follow-up:  ? ?Return in about 9 weeks (around 10/31/2021) for Diabetes /fasting labs. ? ?The above assessment and management plan was discussed with the patient. The patient verbalized understanding of and has agreed to the management plan. Patient is aware to call the clinic if symptoms fail to improve or worsen. Patient is aware when to return to the clinic for  a follow-up visit. Patient educated on when it is appropriate to go to the emergency department.  ? ?Juluis Mire, NP-C ? ?  ?

## 2021-09-09 ENCOUNTER — Other Ambulatory Visit (INDEPENDENT_AMBULATORY_CARE_PROVIDER_SITE_OTHER): Payer: Self-pay | Admitting: Primary Care

## 2021-09-10 NOTE — Telephone Encounter (Signed)
Routed to PCP 

## 2021-09-11 ENCOUNTER — Other Ambulatory Visit (INDEPENDENT_AMBULATORY_CARE_PROVIDER_SITE_OTHER): Payer: Self-pay | Admitting: Primary Care

## 2021-09-11 MED ORDER — EMPAGLIFLOZIN 10 MG PO TABS: 10.0000 mg | ORAL_TABLET | Freq: Every day | ORAL | 1 refills | Status: DC

## 2021-11-01 ENCOUNTER — Ambulatory Visit (INDEPENDENT_AMBULATORY_CARE_PROVIDER_SITE_OTHER): Payer: Medicaid Other | Admitting: Primary Care

## 2021-11-01 ENCOUNTER — Encounter (INDEPENDENT_AMBULATORY_CARE_PROVIDER_SITE_OTHER): Payer: Self-pay | Admitting: Primary Care

## 2021-11-01 VITALS — BP 125/86 | HR 93 | Temp 98.2°F | Ht 70.0 in | Wt 226.2 lb

## 2021-11-01 DIAGNOSIS — I10 Essential (primary) hypertension: Secondary | ICD-10-CM

## 2021-11-01 DIAGNOSIS — M21539 Acquired clawfoot, unspecified foot: Secondary | ICD-10-CM

## 2021-11-01 DIAGNOSIS — Z794 Long term (current) use of insulin: Secondary | ICD-10-CM

## 2021-11-01 DIAGNOSIS — E119 Type 2 diabetes mellitus without complications: Secondary | ICD-10-CM

## 2021-11-01 LAB — POCT GLYCOSYLATED HEMOGLOBIN (HGB A1C): Hemoglobin A1C: 5.3 % (ref 4.0–5.6)

## 2021-11-01 NOTE — Progress Notes (Unsigned)
Subjective:  Patient ID: Jacob Price, male    DOB: 08-30-1985  Age: 37 y.o. MRN: [id number]  CC: Diabetes   HPI Jacob Price presents for Follow-up of diabetes. Patient does check blood sugar at home  Compliant with meds - Yes Checking CBGs? Yes  Fasting avg - 90-110  Postprandial average -  Exercising regularly? - Yes Watching carbohydrate intake? - Yes Neuropathy ? - No Hypoglycemic events - No  - Recovers with :   Pertinent ROS:  Polyuria - No Polydipsia - No Vision problems - No  Medications as noted below. Taking them regularly without complication/adverse reaction being reported today.   History Jacob Price has a past medical history of Asperger's syndrome, Diabetes mellitus without complication ([place]), and Hypertension.   He has no past surgical history on file.   His family history is not on file.He reports that he has never smoked. He has never used smokeless tobacco. He reports that he does not drink alcohol and does not use drugs.  Current Outpatient Medications on File Prior to Visit  Medication Sig Dispense Refill   Accu-Chek Softclix Lancets lancets USE AS DIRECTED THREE TIMES DAILY 100 each 5   benztropine (COGENTIN) 0.5 MG tablet Take 0.5 mg by mouth 2 (two) times daily.     blood glucose meter kit and supplies Dispense based on patient and insurance preference. Use up to four times daily as directed. (FOR ICD-10 E10.9, E11.9). 1 each 0   Blood Glucose Monitoring Suppl (ACCU-CHEK AVIVA PLUS) w/Device KIT See admin instructions.  1   cholecalciferol (VITAMIN D3) 10 MCG (400 UNIT) TABS tablet Take 1 tablet (400 Units total) by mouth daily. 90 tablet 3   empagliflozin (JARDIANCE) 10 MG TABS tablet Take 1 tablet (10 mg total) by mouth daily before breakfast. 90 tablet 1   fenofibrate (TRICOR) 145 MG tablet Take 1 tablet (145 mg total) by mouth daily. 90 tablet 1   glucose blood (ACCU-CHEK AVIVA PLUS) test strip Use TID 100 each 11    hydrochlorothiazide (HYDRODIURIL) 25 MG tablet TAKE 1 TABLET BY MOUTH DAILY 90 tablet 1   Insulin Pen Needle 29G X 12.7MM MISC Insulin administration 5  times daily 150 each 11   Lancets Misc. (ACCU-CHEK FASTCLIX LANCET) KIT Use as directed three times daily 1 kit 0   risperiDONE (RISPERDAL) 2 MG tablet Take 2 mg by mouth 3 (three) times daily.     risperiDONE microspheres (RISPERDAL CONSTA) 25 MG injection Inject 25 mg into the muscle every 14 (fourteen) days.     rosuvastatin (CRESTOR) 40 MG tablet Take 1 tablet (40 mg total) by mouth daily. 90 tablet 1   sitaGLIPtin-metformin (JANUMET) 50-1000 MG tablet TAKE 1 TABLET BY MOUTH 2 (TWO) TIMES DAILY WITH A MEAL. 180 tablet 1   No current facility-administered medications on file prior to visit.    ROS Comprehensive ROS Pertinent positive and negative noted in HPI    Objective:  BP 125/86   Pulse 93   Temp 98.2 F (36.8 C) (Oral)   Ht '5\' 10"'  (1.778 m)   Wt 226 lb 3.2 oz (102.6 kg)   SpO2 96%   BMI 32.46 kg/m   BP Readings from Last 3 Encounters:  11/01/21 125/86  08/29/21 111/79  08/21/21 138/82    Wt Readings from Last 3 Encounters:  11/01/21 226 lb 3.2 oz (102.6 kg)  08/29/21 228 lb 6.4 oz (103.6 kg)  05/22/21 231 lb 6.4 oz (105 kg)    Physical Exam  Vitals reviewed.  Constitutional:      Appearance: Normal appearance. He is obese.  HENT:     Head: Normocephalic.     Right Ear: Tympanic membrane and external ear normal.     Left Ear: Tympanic membrane and external ear normal.     Nose: Nose normal.  Eyes:     Extraocular Movements: Extraocular movements intact.     Pupils: Pupils are equal, round, and reactive to light.  Cardiovascular:     Rate and Rhythm: Normal rate and regular rhythm.  Pulmonary:     Effort: Pulmonary effort is normal.     Breath sounds: Normal breath sounds.  Abdominal:     General: Bowel sounds are normal. There is distension.     Palpations: Abdomen is soft.  Musculoskeletal:         General: Normal range of motion.     Cervical back: Normal range of motion.  Skin:    General: Skin is warm and dry.  Neurological:     Mental Status: He is alert and oriented to person, place, and time.  Psychiatric:        Mood and Affect: Mood normal.        Behavior: Behavior normal.   Lab Results  Component Value Date   HGBA1C 5.3 11/01/2021   HGBA1C 8.2 (A) 07/25/2021   HGBA1C  05/14/2021     Comment:     a1c too high to read.. code 106= A1c greater than 15    Lab Results  Component Value Date   WBC 4.2 05/14/2021   HGB 16.0 05/14/2021   HCT 45.2 05/14/2021   PLT 316 05/14/2021   GLUCOSE 599 (HH) 05/14/2021   CHOL 226 (H) 05/14/2021   TRIG 972 (HH) 05/14/2021   HDL 29 (L) 05/14/2021   LDLCALC Comment (A) 05/14/2021   ALT 15 05/14/2021   AST 16 05/14/2021   NA 133 (L) 05/14/2021   K 4.6 05/14/2021   CL 95 (L) 05/14/2021   CREATININE 0.75 (L) 05/14/2021   BUN 15 05/14/2021   CO2 20 05/14/2021   TSH 1.070 11/03/2016   HGBA1C 5.3 11/01/2021     Assessment & Plan:   Jacob Price was seen today for diabetes.  Diagnoses and all orders for this visit:  Type 2 diabetes mellitus without complication, with long-term current use of insulin (HCC) -     HgB A1c 5.3 significantly improved with pharmacy sending out medication. No changes to avoid rebound . Discussed to let PCP no immediately if any s/s of hypoglycemia   Hypertension, unspecified type Blood pressure is at goal of less than 130/80, low-sodium, DASH diet, medication compliance, 150 minutes of moderate intensity exercise per week. Discussed medication compliance, adverse effects.   Acquired clawfoot, unspecified laterality    Ambulatory referral to Podiatry   I am having Jacob Price maintain his risperiDONE microspheres, benztropine, risperiDONE, Accu-Chek Aviva Plus, Insulin Pen Needle, cholecalciferol, blood glucose meter kit and supplies, rosuvastatin, fenofibrate, Accu-Chek FastClix Lancet,  Accu-Chek Aviva Plus, Accu-Chek Softclix Lancets, hydrochlorothiazide, Janumet, and empagliflozin.  No orders of the defined types were placed in this encounter.    Follow-up:   Return in about 3 months (around 02/01/2022) for DM changed medication.  The above assessment and management plan was discussed with the patient. The patient verbalized understanding of and has agreed to the management plan. Patient is aware to call the clinic if symptoms fail to improve or worsen. Patient is aware when to return to the  clinic for a follow-up visit. Patient educated on when it is appropriate to go to the emergency department.   Juluis Mire, NP-C

## 2021-11-06 ENCOUNTER — Other Ambulatory Visit (INDEPENDENT_AMBULATORY_CARE_PROVIDER_SITE_OTHER): Payer: Self-pay | Admitting: Primary Care

## 2021-12-04 ENCOUNTER — Other Ambulatory Visit (INDEPENDENT_AMBULATORY_CARE_PROVIDER_SITE_OTHER): Payer: Self-pay | Admitting: Primary Care

## 2021-12-04 DIAGNOSIS — E782 Mixed hyperlipidemia: Secondary | ICD-10-CM

## 2021-12-04 NOTE — Telephone Encounter (Signed)
Requested Prescriptions  Pending Prescriptions Disp Refills  . fenofibrate (TRICOR) 145 MG tablet [Pharmacy Med Name: Fenofibrate 145MG TABS] 90 tablet 1    Sig: TAKE 1 TABLET BY MOUTH DAILY     Cardiovascular:  Antilipid - Fibric Acid Derivatives Failed - 12/04/2021  3:41 PM      Failed - Cr in normal range and within 360 days    Creatinine, Ser  Date Value Ref Range Status  05/14/2021 0.75 (L) 0.76 - 1.27 mg/dL Final         Failed - Lipid Panel in normal range within the last 12 months    Cholesterol, Total  Date Value Ref Range Status  05/14/2021 226 (H) 100 - 199 mg/dL Final   LDL Chol Calc (NIH)  Date Value Ref Range Status  05/14/2021 Comment (A) 0 - 99 mg/dL Final    Comment:    Triglyceride result indicated is too high for an accurate LDL cholesterol estimation.    HDL  Date Value Ref Range Status  05/14/2021 29 (L) >39 mg/dL Final   Triglycerides  Date Value Ref Range Status  05/14/2021 972 (HH) 0 - 149 mg/dL Final    Comment:    Results confirmed on dilution.          Passed - ALT in normal range and within 360 days    ALT  Date Value Ref Range Status  05/14/2021 15 0 - 44 IU/L Final    Comment:    The specimen was lipemic.  The lipemia was cleared by ultracentrifugation before testing.  However HDL, direct LDL, cholesterol and triglyceride (if ordered) were performed prior to ultracentrifugation.          Passed - AST in normal range and within 360 days    AST  Date Value Ref Range Status  05/14/2021 16 0 - 40 IU/L Final    Comment:    The specimen was lipemic.  The lipemia was cleared by ultracentrifugation before testing.  However HDL, direct LDL, cholesterol and triglyceride (if ordered) were performed prior to ultracentrifugation.          Passed - HGB in normal range and within 360 days    Hemoglobin  Date Value Ref Range Status  05/14/2021 16.0 13.0 - 17.7 g/dL Final         Passed - HCT in normal range and within 360 days     Hematocrit  Date Value Ref Range Status  05/14/2021 45.2 37.5 - 51.0 % Final         Passed - PLT in normal range and within 360 days    Platelets  Date Value Ref Range Status  05/14/2021 316 150 - 450 x10E3/uL Final         Passed - WBC in normal range and within 360 days    WBC  Date Value Ref Range Status  05/14/2021 4.2 3.4 - 10.8 x10E3/uL Final  12/20/2019 6.5 4.0 - 10.5 K/uL Final         Passed - eGFR is 30 or above and within 360 days    GFR calc Af Amer  Date Value Ref Range Status  12/20/2019 >60 >60 mL/min Final   GFR calc non Af Amer  Date Value Ref Range Status  12/20/2019 >60 >60 mL/min Final   eGFR  Date Value Ref Range Status  05/14/2021 120 >59 mL/min/1.73 Final         Passed - Valid encounter within last 12 months  Recent Outpatient Visits          1 month ago Type 2 diabetes mellitus without complication, with long-term current use of insulin ([place])   [place] [facility] Juluis Mire P, NP   3 months ago Type 2 diabetes mellitus without complication, with long-term current use of insulin ([place])   [place] Juluis Mire P, NP   4 months ago Type 2 diabetes mellitus without complication, without long-term current use of insulin [facility])   [place], Annie Main L, RPH-CPP   5 months ago Type 2 diabetes mellitus without complication, without long-term current use of insulin [facility])   [place], Annie Main L, RPH-CPP   5 months ago Type 2 diabetes mellitus without complication, without long-term current use of insulin [facility])   [place], RPH-CPP      Future Appointments            In 2 months Oletta Lamas, [place] Cage, NP [place]           . hydrOXYzine (ATARAX) 25 MG tablet [Pharmacy Med Name: hydrOXYzine HCl 25MG TABS*] 90 tablet     Sig: TAKE 1  TABLET BY MOUTH DAILY     Ear, Nose, and Throat:  Antihistamines 2 Failed - 12/04/2021  3:41 PM      Failed - Cr in normal range and within 360 days    Creatinine, Ser  Date Value Ref Range Status  05/14/2021 0.75 (L) 0.76 - 1.27 mg/dL Final         Passed - Valid encounter within last 12 months    Recent Outpatient Visits          1 month ago Type 2 diabetes mellitus without complication, with long-term current use of insulin (HCC)   [place] [facility] Juluis Mire P, NP   3 months ago Type 2 diabetes mellitus without complication, with long-term current use of insulin ([place])   [place] [facility] Juluis Mire P, NP   4 months ago Type 2 diabetes mellitus without complication, without long-term current use of insulin ([place])   [place], Annie Main L, RPH-CPP   5 months ago Type 2 diabetes mellitus without complication, without long-term current use of insulin [facility])   [place], Annie Main L, RPH-CPP   5 months ago Type 2 diabetes mellitus without complication, without long-term current use of insulin [facility])   [place], RPH-CPP      Future Appointments            In 2 months Oletta Lamas, [place] Cage, NP [place]           . rosuvastatin (CRESTOR) 40 MG tablet [Pharmacy Med Name: Rosuvastatin Calcium 40MG TABS] 90 tablet 1    Sig: TAKE 1 TABLET BY MOUTH DAILY     Cardiovascular:  Antilipid - Statins 2 Failed - 12/04/2021  3:41 PM      Failed - Cr in normal range and within 360 days    Creatinine, Ser  Date Value Ref Range Status  05/14/2021 0.75 (L) 0.76 - 1.27 mg/dL Final         Failed - Lipid Panel in normal range within the last 12 months  Cholesterol, Total  Date Value Ref Range Status  05/14/2021 226 (H) 100 - 199 mg/dL Final   LDL Chol Calc (NIH)  Date Value Ref Range  Status  05/14/2021 Comment (A) 0 - 99 mg/dL Final    Comment:    Triglyceride result indicated is too high for an accurate LDL cholesterol estimation.    HDL  Date Value Ref Range Status  05/14/2021 29 (L) >39 mg/dL Final   Triglycerides  Date Value Ref Range Status  05/14/2021 972 (HH) 0 - 149 mg/dL Final    Comment:    Results confirmed on dilution.          Passed - Patient is not pregnant      Passed - Valid encounter within last 12 months    Recent Outpatient Visits          1 month ago Type 2 diabetes mellitus without complication, with long-term current use of insulin ([place])   [place] [facility] Juluis Mire P, NP   3 months ago Type 2 diabetes mellitus without complication, with long-term current use of insulin ([place])   [place] [facility] Juluis Mire P, NP   4 months ago Type 2 diabetes mellitus without complication, without long-term current use of insulin ([place])   [place], Annie Main L, RPH-CPP   5 months ago Type 2 diabetes mellitus without complication, without long-term current use of insulin [facility])   [place], Annie Main L, RPH-CPP   5 months ago Type 2 diabetes mellitus without complication, without long-term current use of insulin [facility])   [place], RPH-CPP      Future Appointments            In 2 months Oletta Lamas, [place] Cage, NP [place]

## 2021-12-04 NOTE — Telephone Encounter (Signed)
Request routed to PCP ?

## 2021-12-04 NOTE — Telephone Encounter (Signed)
Requested medication (s) are due for refill today: -  Requested medication (s) are on the active medication list: no  Last refill:  06/21/21 #90  Future visit scheduled: yes  Notes to clinic:  not on med list or past meds   Requested Prescriptions  Pending Prescriptions Disp Refills   hydrOXYzine (ATARAX) 25 MG tablet [Pharmacy Med Name: hydrOXYzine HCl 25MG TABS*] 90 tablet     Sig: TAKE 1 TABLET BY MOUTH DAILY     Ear, Nose, and Throat:  Antihistamines 2 Failed - 12/04/2021  3:41 PM      Failed - Cr in normal range and within 360 days    Creatinine, Ser  Date Value Ref Range Status  05/14/2021 0.75 (L) 0.76 - 1.27 mg/dL Final         Passed - Valid encounter within last 12 months    Recent Outpatient Visits           1 month ago Type 2 diabetes mellitus without complication, with long-term current use of insulin ([place])   [place] [facility] Kerin Perna, NP   3 months ago Type 2 diabetes mellitus without complication, with long-term current use of insulin ([place])   [place] [facility] Kerin Perna, NP   4 months ago Type 2 diabetes mellitus without complication, without long-term current use of insulin ([place])   [place], Annie Main L, RPH-CPP   5 months ago Type 2 diabetes mellitus without complication, without long-term current use of insulin ([place])   [place], Annie Main L, RPH-CPP   5 months ago Type 2 diabetes mellitus without complication, without long-term current use of insulin ([place])   [place], Jarome Matin, RPH-CPP       Future Appointments             In 2 months Oletta Lamas, [place] Cage, NP [facility]            Signed Prescriptions Disp Refills   fenofibrate (TRICOR) 145 MG tablet 90 tablet 1    Sig: TAKE 1 TABLET BY MOUTH DAILY     Cardiovascular:  Antilipid - Fibric  Acid Derivatives Failed - 12/04/2021  3:41 PM      Failed - Cr in normal range and within 360 days    Creatinine, Ser  Date Value Ref Range Status  05/14/2021 0.75 (L) 0.76 - 1.27 mg/dL Final         Failed - Lipid Panel in normal range within the last 12 months    Cholesterol, Total  Date Value Ref Range Status  05/14/2021 226 (H) 100 - 199 mg/dL Final   LDL Chol Calc (NIH)  Date Value Ref Range Status  05/14/2021 Comment (A) 0 - 99 mg/dL Final    Comment:    Triglyceride result indicated is too high for an accurate LDL cholesterol estimation.    HDL  Date Value Ref Range Status  05/14/2021 29 (L) >39 mg/dL Final   Triglycerides  Date Value Ref Range Status  05/14/2021 972 (HH) 0 - 149 mg/dL Final    Comment:    Results confirmed on dilution.          Passed - ALT in normal range and within 360 days    ALT  Date Value Ref Range Status  05/14/2021 15 0 - 44 IU/L Final    Comment:  The specimen was lipemic.  The lipemia was cleared by ultracentrifugation before testing.  However HDL, direct LDL, cholesterol and triglyceride (if ordered) were performed prior to ultracentrifugation.          Passed - AST in normal range and within 360 days    AST  Date Value Ref Range Status  05/14/2021 16 0 - 40 IU/L Final    Comment:    The specimen was lipemic.  The lipemia was cleared by ultracentrifugation before testing.  However HDL, direct LDL, cholesterol and triglyceride (if ordered) were performed prior to ultracentrifugation.          Passed - HGB in normal range and within 360 days    Hemoglobin  Date Value Ref Range Status  05/14/2021 16.0 13.0 - 17.7 g/dL Final         Passed - HCT in normal range and within 360 days    Hematocrit  Date Value Ref Range Status  05/14/2021 45.2 37.5 - 51.0 % Final         Passed - PLT in normal range and within 360 days    Platelets  Date Value Ref Range Status  05/14/2021 316 150 - 450 x10E3/uL Final          Passed - WBC in normal range and within 360 days    WBC  Date Value Ref Range Status  05/14/2021 4.2 3.4 - 10.8 x10E3/uL Final  12/20/2019 6.5 4.0 - 10.5 K/uL Final         Passed - eGFR is 30 or above and within 360 days    GFR calc Af Amer  Date Value Ref Range Status  12/20/2019 >60 >60 mL/min Final   GFR calc non Af Amer  Date Value Ref Range Status  12/20/2019 >60 >60 mL/min Final   eGFR  Date Value Ref Range Status  05/14/2021 120 >59 mL/min/1.73 Final         Passed - Valid encounter within last 12 months    Recent Outpatient Visits           1 month ago Type 2 diabetes mellitus without complication, with long-term current use of insulin (HCC)   [place] [facility] Juluis Mire P, NP   3 months ago Type 2 diabetes mellitus without complication, with long-term current use of insulin ([place])   [place] [facility] Juluis Mire P, NP   4 months ago Type 2 diabetes mellitus without complication, without long-term current use of insulin ([place])   [place], Annie Main L, RPH-CPP   5 months ago Type 2 diabetes mellitus without complication, without long-term current use of insulin [facility])   [place], Annie Main L, RPH-CPP   5 months ago Type 2 diabetes mellitus without complication, without long-term current use of insulin [facility])   [place], Jarome Matin, RPH-CPP       Future Appointments             In 2 months [place], [place] Cage, NP [facility]             rosuvastatin (CRESTOR) 40 MG tablet 90 tablet 1    Sig: TAKE 1 TABLET BY MOUTH DAILY     Cardiovascular:  Antilipid - Statins 2 Failed - 12/04/2021  3:41 PM      Failed - Cr in normal range and within 360 days  Creatinine, Ser  Date Value Ref Range Status  05/14/2021 0.75 (L) 0.76 - 1.27 mg/dL Final         Failed - Lipid  Panel in normal range within the last 12 months    Cholesterol, Total  Date Value Ref Range Status  05/14/2021 226 (H) 100 - 199 mg/dL Final   LDL Chol Calc (NIH)  Date Value Ref Range Status  05/14/2021 Comment (A) 0 - 99 mg/dL Final    Comment:    Triglyceride result indicated is too high for an accurate LDL cholesterol estimation.    HDL  Date Value Ref Range Status  05/14/2021 29 (L) >39 mg/dL Final   Triglycerides  Date Value Ref Range Status  05/14/2021 972 (HH) 0 - 149 mg/dL Final    Comment:    Results confirmed on dilution.          Passed - Patient is not pregnant      Passed - Valid encounter within last 12 months    Recent Outpatient Visits           1 month ago Type 2 diabetes mellitus without complication, with long-term current use of insulin ([place])   [place] [facility] Juluis Mire P, NP   3 months ago Type 2 diabetes mellitus without complication, with long-term current use of insulin ([place])   [place] [facility] Juluis Mire P, NP   4 months ago Type 2 diabetes mellitus without complication, without long-term current use of insulin ([place])   [place], Annie Main L, RPH-CPP   5 months ago Type 2 diabetes mellitus without complication, without long-term current use of insulin [facility])   [place], Annie Main L, RPH-CPP   5 months ago Type 2 diabetes mellitus without complication, without long-term current use of insulin [facility])   [place], RPH-CPP       Future Appointments             In 2 months Oletta Lamas, [place] Cage, NP [place]

## 2022-02-03 ENCOUNTER — Ambulatory Visit (INDEPENDENT_AMBULATORY_CARE_PROVIDER_SITE_OTHER): Payer: Medicaid Other | Admitting: Primary Care

## 2022-02-11 ENCOUNTER — Other Ambulatory Visit (INDEPENDENT_AMBULATORY_CARE_PROVIDER_SITE_OTHER): Payer: Self-pay | Admitting: Primary Care

## 2022-02-11 DIAGNOSIS — I10 Essential (primary) hypertension: Secondary | ICD-10-CM

## 2022-02-11 DIAGNOSIS — E0800 Diabetes mellitus due to underlying condition with hyperosmolarity without nonketotic hyperglycemic-hyperosmolar coma (NKHHC): Secondary | ICD-10-CM

## 2022-02-11 NOTE — Telephone Encounter (Signed)
Requested Prescriptions  Pending Prescriptions Disp Refills  . Accu-Chek Softclix Lancets lancets [Pharmacy Med Name: Accu-Chek Softclix Lancets MISC] 100 each 3    Sig: USE AS DIRECTED THREE TIMES DAILY     Endocrinology: Diabetes - Testing Supplies Passed - 02/11/2022  4:02 PM      Passed - Valid encounter within last 12 months    Recent Outpatient Visits          3 months ago Type 2 diabetes mellitus without complication, with long-term current use of insulin (HCC)   [place] [facility] [name] P, NP   5 months ago Type 2 diabetes mellitus without complication, with long-term current use of insulin (HCC)   [place] [facility] [name] P, NP   6 months ago Type 2 diabetes mellitus without complication, without long-term current use of insulin (HCC)   [place] [facility] [place], Jeannett Senior L, RPH-CPP   7 months ago Type 2 diabetes mellitus without complication, without long-term current use of insulin [facility])   [place] [facility] [place], Jeannett Senior L, RPH-CPP   8 months ago Type 2 diabetes mellitus without complication, without long-term current use of insulin [facility])   [place] [facility] [place], Cornelius Moras, RPH-CPP      Future Appointments            In 1 week Grayce Sessions, NP [facility]           . hydrochlorothiazide (HYDRODIURIL) 25 MG tablet [Pharmacy Med Name: hydroCHLOROthiazide 25MG  TABS*] 90 tablet 0    Sig: TAKE 1 TABLET BY MOUTH DAILY     Cardiovascular: Diuretics - Thiazide Failed - 02/11/2022  4:02 PM      Failed - Cr in normal range and within 180 days    Creatinine, Ser  Date Value Ref Range Status  05/14/2021 0.75 (L) 0.76 - 1.27 mg/dL Final         Failed - K in normal range and within 180 days    Potassium  Date Value Ref Range Status  05/14/2021 4.6 3.5 - 5.2 mmol/L Final         Failed - Na in normal  range and within 180 days    Sodium  Date Value Ref Range Status  05/14/2021 133 (L) 134 - 144 mmol/L Final         Passed - Last BP in normal range    BP Readings from Last 1 Encounters:  11/01/21 125/86         Passed - Valid encounter within last 6 months    Recent Outpatient Visits          3 months ago Type 2 diabetes mellitus without complication, with long-term current use of insulin (HCC)   [place] [facility] 01/02/22 P, NP   5 months ago Type 2 diabetes mellitus without complication, with long-term current use of insulin (HCC)   [place] [facility] [name], NP   6 months ago Type 2 diabetes mellitus without complication, without long-term current use of insulin (HCC)   [place] [facility] [place], KOOMBERKINE L, RPH-CPP   7 months ago Type 2 diabetes mellitus without complication, without long-term current use of insulin [facility])   [place] [facility] [place], KOOMBERKINE L, RPH-CPP   8 months ago Type 2 diabetes mellitus without complication,  without long-term current use of insulin [facility])   [place], RPH-CPP      Future Appointments            In 1 week Oletta Lamas [place] Cage, NP [place]

## 2022-02-20 ENCOUNTER — Encounter (INDEPENDENT_AMBULATORY_CARE_PROVIDER_SITE_OTHER): Payer: Self-pay | Admitting: Primary Care

## 2022-02-20 ENCOUNTER — Ambulatory Visit (INDEPENDENT_AMBULATORY_CARE_PROVIDER_SITE_OTHER): Payer: Medicaid Other | Admitting: Primary Care

## 2022-02-20 VITALS — BP 114/80 | HR 74 | Resp 16 | Wt 217.8 lb

## 2022-02-20 DIAGNOSIS — E119 Type 2 diabetes mellitus without complications: Secondary | ICD-10-CM | POA: Diagnosis not present

## 2022-02-20 DIAGNOSIS — E559 Vitamin D deficiency, unspecified: Secondary | ICD-10-CM

## 2022-02-20 DIAGNOSIS — Z794 Long term (current) use of insulin: Secondary | ICD-10-CM

## 2022-02-20 DIAGNOSIS — E782 Mixed hyperlipidemia: Secondary | ICD-10-CM

## 2022-02-21 LAB — LIPID PANEL
Chol/HDL Ratio: 6.1 ratio — ABNORMAL HIGH (ref 0.0–5.0)
Cholesterol, Total: 224 mg/dL — ABNORMAL HIGH (ref 100–199)
HDL: 37 mg/dL — ABNORMAL LOW (ref >39)
LDL Chol Calc (NIH): 155 mg/dL — ABNORMAL HIGH (ref 0–99)
Triglycerides: 172 mg/dL — ABNORMAL HIGH (ref 0–149)
VLDL Cholesterol Cal: 32 mg/dL (ref 5–40)

## 2022-02-21 LAB — VITAMIN D 25 HYDROXY (VIT D DEFICIENCY, FRACTURES): Vit D, 25-Hydroxy: 15.1 ng/mL — ABNORMAL LOW (ref 30.0–100.0)

## 2022-02-22 LAB — MICROALBUMIN / CREATININE URINE RATIO
Creatinine, Urine: 253.8 mg/dL
Microalb/Creat Ratio: 3 mg/g creat (ref 0–29)
Microalbumin, Urine: 8 ug/mL

## 2022-02-23 NOTE — Progress Notes (Signed)
Subjective:  Patient ID: Jacob Price, male    DOB: 04-19-1986  Age: 36 y.o. MRN: [id number]  CC: Diabetes   HPI Jacob Price presents for Follow-up of diabetes. Patient does not check blood sugar at home  Compliant with meds - Yes Checking CBGs? No  Fasting avg -   Postprandial average -  Exercising regularly? - Yes Watching carbohydrate intake? - Yes Neuropathy ? - No Hypoglycemic events - No  - Recovers with :   Pertinent ROS:  Polyuria - No Polydipsia - No Vision problems - No  Medications as noted below. Taking them regularly without complication/adverse reaction being reported today.   History Jacob Price has a past medical history of Asperger's syndrome, Diabetes mellitus without complication ([place]), and Hypertension.   He has no past surgical history on file.   His family history is not on file.He reports that he has never smoked. He has never used smokeless tobacco. He reports that he does not drink alcohol and does not use drugs.  Current Outpatient Medications on File Prior to Visit  Medication Sig Dispense Refill   Accu-Chek Softclix Lancets lancets USE AS DIRECTED THREE TIMES DAILY 100 each 3   benztropine (COGENTIN) 0.5 MG tablet Take 0.5 mg by mouth 2 (two) times daily.     blood glucose meter kit and supplies Dispense based on patient and insurance preference. Use up to four times daily as directed. (FOR ICD-10 E10.9, E11.9). 1 each 0   Blood Glucose Monitoring Suppl (ACCU-CHEK AVIVA PLUS) w/Device KIT See admin instructions.  1   cholecalciferol (VITAMIN D3) 10 MCG (400 UNIT) TABS tablet Take 1 tablet (400 Units total) by mouth daily. 90 tablet 3   empagliflozin (JARDIANCE) 10 MG TABS tablet Take 1 tablet (10 mg total) by mouth daily before breakfast. 90 tablet 1   fenofibrate (TRICOR) 145 MG tablet TAKE 1 TABLET BY MOUTH DAILY 90 tablet 1   glucose blood (ACCU-CHEK AVIVA PLUS) test strip Use TID 100 each 11   hydrochlorothiazide  (HYDRODIURIL) 25 MG tablet TAKE 1 TABLET BY MOUTH DAILY 90 tablet 0   hydrOXYzine (ATARAX) 25 MG tablet TAKE 1 TABLET BY MOUTH DAILY 90 tablet 1   Insulin Pen Needle 29G X 12.7MM MISC Insulin administration 5  times daily 150 each 11   Lancets Misc. (ACCU-CHEK FASTCLIX LANCET) KIT Use as directed three times daily 1 kit 0   risperiDONE (RISPERDAL) 2 MG tablet Take 2 mg by mouth 3 (three) times daily.     risperiDONE microspheres (RISPERDAL CONSTA) 25 MG injection Inject 25 mg into the muscle every 14 (fourteen) days.     rosuvastatin (CRESTOR) 40 MG tablet TAKE 1 TABLET BY MOUTH DAILY 90 tablet 1   sitaGLIPtin-metformin (JANUMET) 50-1000 MG tablet TAKE 1 TABLET BY MOUTH 2 (TWO) TIMES DAILY WITH A MEAL. 180 tablet 1   No current facility-administered medications on file prior to visit.    ROS Comprehensive ROS Pertinent positive and negative noted in HPI    Objective:  BP 114/80   Pulse 74   Resp 16   Wt 217 lb 12.8 oz (98.8 kg)   SpO2 97%   BMI 31.25 kg/m   BP Readings from Last 3 Encounters:  02/20/22 114/80  11/01/21 125/86  08/29/21 111/79    Wt Readings from Last 3 Encounters:  02/20/22 217 lb 12.8 oz (98.8 kg)  11/01/21 226 lb 3.2 oz (102.6 kg)  08/29/21 228 lb 6.4 oz (103.6 kg)    Physical Exam  Vitals reviewed.  Constitutional:      Appearance: Normal appearance. He is obese.  HENT:     Head: Normocephalic.     Right Ear: Tympanic membrane and external ear normal.     Left Ear: Tympanic membrane and external ear normal.     Nose: Nose normal.  Eyes:     Extraocular Movements: Extraocular movements intact.     Conjunctiva/sclera: Conjunctivae normal.     Pupils: Pupils are equal, round, and reactive to light.  Cardiovascular:     Rate and Rhythm: Normal rate and regular rhythm.  Pulmonary:     Effort: Pulmonary effort is normal.     Breath sounds: Normal breath sounds.  Abdominal:     General: Bowel sounds are normal. There is distension.     Palpations:  Abdomen is soft.  Musculoskeletal:        General: Normal range of motion.     Cervical back: Normal range of motion.  Skin:    General: Skin is warm and dry.  Neurological:     Mental Status: He is alert. Mental status is at baseline.  Psychiatric:        Mood and Affect: Mood normal.        Behavior: Behavior normal.        Thought Content: Thought content normal.    Lab Results  Component Value Date   HGBA1C 5.3 11/01/2021   HGBA1C 8.2 (A) 07/25/2021   HGBA1C  05/14/2021     Comment:     a1c too high to read.. code 106= A1c greater than 15    Lab Results  Component Value Date   WBC 4.2 05/14/2021   HGB 16.0 05/14/2021   HCT 45.2 05/14/2021   PLT 316 05/14/2021   GLUCOSE 599 (HH) 05/14/2021   CHOL 224 (H) 02/20/2022   TRIG 172 (H) 02/20/2022   HDL 37 (L) 02/20/2022   LDLCALC 155 (H) 02/20/2022   ALT 15 05/14/2021   AST 16 05/14/2021   NA 133 (L) 05/14/2021   K 4.6 05/14/2021   CL 95 (L) 05/14/2021   CREATININE 0.75 (L) 05/14/2021   BUN 15 05/14/2021   CO2 20 05/14/2021   TSH 1.070 11/03/2016   HGBA1C 5.3 11/01/2021     Assessment & Plan:   Jacob Price was seen today for diabetes.  Diagnoses and all orders for this visit:  Type 2 diabetes mellitus without complication, with long-term current use of insulin (HCC) Previous A1c was 5.3 .  He denies any hypoglycemic reactions.  Considering medication adjustments however patient does not do well with change will review with clinical social worker.  Have patient to come in to discuss lab results and any changes at that time -     Microalbumin / creatinine urine ratio  Mixed hyperlipidemia Discussed high cholesterol may increase risk of heart attack and/or stroke. Consider eating more fruits, vegetables, and lean baked meats such as chicken or fish. Moderate intensity exercise at least 150 minutes as tolerated per week may help as well.   -     Lipid Panel  Vitamin D deficiency -     Vitamin D, 25-hydroxy   I am  having Jacob Price maintain his risperiDONE microspheres, benztropine, risperiDONE, Accu-Chek Aviva Plus, Insulin Pen Needle, cholecalciferol, blood glucose meter kit and supplies, Accu-Chek [employer], Accu-Chek Aviva Plus, Janumet, empagliflozin, fenofibrate, hydrOXYzine, rosuvastatin, Accu-Chek Softclix Lancets, and hydrochlorothiazide.  No orders of the defined types were placed in this encounter.  Follow-up:   No follow-ups on file.  The above assessment and management plan was discussed with the patient. The patient verbalized understanding of and has agreed to the management plan. Patient is aware to call the clinic if symptoms fail to improve or worsen. Patient is aware when to return to the clinic for a follow-up visit. Patient educated on when it is appropriate to go to the emergency department.   Juluis Mire, NP-C

## 2022-02-24 ENCOUNTER — Telehealth (INDEPENDENT_AMBULATORY_CARE_PROVIDER_SITE_OTHER): Payer: Self-pay | Admitting: Primary Care

## 2022-02-24 NOTE — Telephone Encounter (Signed)
Copied from [place] [id number]. Topic: General - Inquiry >> Feb 24, 2022  2:01 PM Teressa P wrote: Pt called for test results from his A1C...  CB#  [phone]

## 2022-02-24 NOTE — Telephone Encounter (Signed)
Have already spoke with pt

## 2022-02-25 ENCOUNTER — Other Ambulatory Visit (INDEPENDENT_AMBULATORY_CARE_PROVIDER_SITE_OTHER): Payer: Self-pay | Admitting: Primary Care

## 2022-02-25 NOTE — Telephone Encounter (Signed)
Requested Prescriptions  Pending Prescriptions Disp Refills  . JANUMET 50-1000 MG tablet [Pharmacy Med Name: Janumet 50-1000MG TABS] 180 tablet 0    Sig: TAKE 1 TABLET BY MOUTH TWICE A DAY WITH MEALS     Endocrinology:  Diabetes - Biguanide + DPP-4 Inhibitor Combos Failed - 02/25/2022  2:36 PM      Failed - Cr in normal range and within 360 days    Creatinine, Ser  Date Value Ref Range Status  05/14/2021 0.75 (L) 0.76 - 1.27 mg/dL Final         Failed - B12 Level in normal range and within 720 days    No results found for: "VITAMINB12"       Passed - HBA1C is between 0 and 7.9 and within 180 days    Hemoglobin A1C  Date Value Ref Range Status  11/01/2021 5.3 4.0 - 5.6 % Final   HbA1c, POC (controlled diabetic range)  Date Value Ref Range Status  07/25/2021 8.2 (A) 0.0 - 7.0 % Final         Passed - eGFR in normal range and within 360 days    GFR calc Af Amer  Date Value Ref Range Status  12/20/2019 >60 >60 mL/min Final   GFR calc non Af Amer  Date Value Ref Range Status  12/20/2019 >60 >60 mL/min Final   eGFR  Date Value Ref Range Status  05/14/2021 120 >59 mL/min/1.73 Final         Passed - Valid encounter within last 6 months    Recent Outpatient Visits          5 days ago Type 2 diabetes mellitus without complication, with long-term current use of insulin ([place])   [place] [facility] Juluis Mire P, NP   3 months ago Type 2 diabetes mellitus without complication, with long-term current use of insulin ([place])   [place] [facility] Juluis Mire P, NP   6 months ago Type 2 diabetes mellitus without complication, with long-term current use of insulin ([place])   [place] [facility] Juluis Mire P, NP   7 months ago Type 2 diabetes mellitus without complication, without long-term current use of insulin ([place])   [place], Annie Main L, RPH-CPP   8 months ago Type 2 diabetes  mellitus without complication, without long-term current use of insulin [facility])   [place], Jarome Matin, RPH-CPP      Future Appointments            In 4 weeks Oletta Lamas, [place] Cage, NP [facility]   In 3 months Kerin Perna, NP [facility]           Passed - CBC within normal limits and completed in the last 12 months    WBC  Date Value Ref Range Status  05/14/2021 4.2 3.4 - 10.8 x10E3/uL Final  12/20/2019 6.5 4.0 - 10.5 K/uL Final   RBC  Date Value Ref Range Status  05/14/2021 5.27 4.14 - 5.80 x10E6/uL Final  12/20/2019 4.94 4.22 - 5.81 MIL/uL Final   Hemoglobin  Date Value Ref Range Status  05/14/2021 16.0 13.0 - 17.7 g/dL Final   Hematocrit  Date Value Ref Range Status  05/14/2021 45.2 37.5 - 51.0 % Final   MCHC  Date Value Ref Range Status  05/14/2021 35.4 31.5 - 35.7 g/dL Final  12/20/2019 33.1 30.0 -  36.0 g/dL Final   [facility]  Date Value Ref Range Status  05/14/2021 30.4 26.6 - 33.0 pg Final  12/20/2019 28.5 26.0 - 34.0 pg Final   MCV  Date Value Ref Range Status  05/14/2021 86 79 - 97 fL Final   No results found for: "PLTCOUNTKUC", "LABPLAT", "POCPLA" RDW  Date Value Ref Range Status  05/14/2021 12.8 11.6 - 15.4 % Final

## 2022-03-26 ENCOUNTER — Ambulatory Visit (INDEPENDENT_AMBULATORY_CARE_PROVIDER_SITE_OTHER): Payer: Medicaid Other | Admitting: Primary Care

## 2022-04-07 ENCOUNTER — Ambulatory Visit (INDEPENDENT_AMBULATORY_CARE_PROVIDER_SITE_OTHER): Payer: Medicaid Other | Admitting: Primary Care

## 2022-04-07 ENCOUNTER — Encounter (INDEPENDENT_AMBULATORY_CARE_PROVIDER_SITE_OTHER): Payer: Self-pay | Admitting: Primary Care

## 2022-04-07 VITALS — BP 112/82 | HR 93

## 2022-04-07 DIAGNOSIS — E559 Vitamin D deficiency, unspecified: Secondary | ICD-10-CM | POA: Diagnosis not present

## 2022-04-07 DIAGNOSIS — E782 Mixed hyperlipidemia: Secondary | ICD-10-CM | POA: Diagnosis not present

## 2022-04-07 DIAGNOSIS — E119 Type 2 diabetes mellitus without complications: Secondary | ICD-10-CM

## 2022-04-07 DIAGNOSIS — Z23 Encounter for immunization: Secondary | ICD-10-CM | POA: Diagnosis not present

## 2022-04-07 DIAGNOSIS — Z794 Long term (current) use of insulin: Secondary | ICD-10-CM

## 2022-04-07 MED ORDER — VITAMIN D (ERGOCALCIFEROL) 1.25 MG (50000 UNIT) PO CAPS: 50000.0000 [IU] | ORAL_CAPSULE | ORAL | 0 refills | Status: DC

## 2022-04-07 MED ORDER — VITAMIN D3 50 MCG (2000 UT) PO CAPS: 2000.0000 [IU] | ORAL_CAPSULE | Freq: Every day | ORAL | 1 refills | Status: DC

## 2022-04-07 MED ORDER — ROSUVASTATIN CALCIUM 40 MG PO TABS: 40.0000 mg | ORAL_TABLET | Freq: Every day | ORAL | 1 refills | Status: DC

## 2022-04-07 NOTE — Progress Notes (Signed)
[place], is a 36 y.o. male  EEF:[id number]  OIT:[id number]  DOB - 1985-08-05  No chief complaint on file.      Subjective:   Mr. Kamaron Deskins is a 36 y.o. obese male here today requested by .PCP to go over recent labs and changes in medication.  Patient continues to be doing very well medication is dispensed by [employer].  Patient has No headache, No chest pain, No abdominal pain - No Nausea, No new weakness tingling or numbness, No Cough - shortness of breath  No problems updated.  No Known Allergies  Past Medical History:  Diagnosis Date   Asperger's syndrome    Diabetes mellitus without complication ([place])    Hypertension     Current Outpatient Medications on File Prior to Visit  Medication Sig Dispense Refill   Accu-Chek Softclix Lancets lancets USE AS DIRECTED THREE TIMES DAILY 100 each 3   benztropine (COGENTIN) 0.5 MG tablet Take 0.5 mg by mouth 2 (two) times daily.     blood glucose meter kit and supplies Dispense based on patient and insurance preference. Use up to four times daily as directed. (FOR ICD-10 E10.9, E11.9). 1 each 0   Blood Glucose Monitoring Suppl (ACCU-CHEK AVIVA PLUS) w/Device KIT See admin instructions.  1   cholecalciferol (VITAMIN D3) 10 MCG (400 UNIT) TABS tablet Take 1 tablet (400 Units total) by mouth daily. 90 tablet 3   empagliflozin (JARDIANCE) 10 MG TABS tablet Take 1 tablet (10 mg total) by mouth daily before breakfast. 90 tablet 1   glucose blood (ACCU-CHEK AVIVA PLUS) test strip Use TID 100 each 11   hydrochlorothiazide (HYDRODIURIL) 25 MG tablet TAKE 1 TABLET BY MOUTH DAILY 90 tablet 0   hydrOXYzine (ATARAX) 25 MG tablet TAKE 1 TABLET BY MOUTH DAILY 90 tablet 1   Insulin Pen Needle 29G X 12.7MM MISC Insulin administration 5  times daily 150 each 11   JANUMET 50-1000 MG tablet TAKE 1 TABLET BY MOUTH TWICE A DAY WITH MEALS 180 tablet 0   Lancets Misc. (ACCU-CHEK FASTCLIX LANCET) KIT Use as  directed three times daily 1 kit 0   risperiDONE (RISPERDAL) 2 MG tablet Take 2 mg by mouth 3 (three) times daily.     risperiDONE microspheres (RISPERDAL CONSTA) 25 MG injection Inject 25 mg into the muscle every 14 (fourteen) days.     No current facility-administered medications on file prior to visit.    Objective:   Vitals:   04/07/22 1417  BP: 112/82  Pulse: 93  SpO2: 98%    Exam General appearance : Awake, alert, not in any distress. Speech Clear. Not toxic looking HEENT: Atraumatic and Normocephalic, pupils equally reactive to light and accomodation Neck: Supple, no JVD. No cervical lymphadenopathy.  Chest: Good air entry bilaterally, no added sounds  CVS: S1 S2 regular, no murmurs.  Abdomen: Bowel sounds present, Non tender and not distended with no gaurding, rigidity or rebound. Extremities: B/L Lower Ext shows no edema, both legs are warm to touch Neurology: Awake alert, and oriented X 3, CN II-XII intact, Non focal Skin: No Rash  Data Review Lab Results  Component Value Date   HGBA1C 5.3 11/01/2021   HGBA1C 8.2 (A) 07/25/2021   HGBA1C  05/14/2021     Comment:     a1c too high to read.. code 106= A1c greater than 15    Assessment & Plan   1. Mixed hyperlipidemia Lipids have improved discontinued gemfibrozil 600 mg twice daily  Healthy lifestyle diet of fruits vegetables fish nuts whole grains and low saturated fat . Foods high in cholesterol or liver, fatty meats,cheese, butter avocados, nuts and seeds, chocolate and fried foods.  - rosuvastatin (CRESTOR) 40 MG tablet; Take 1 tablet (40 mg total) by mouth daily.  Dispense: 90 tablet; Refill: 1  2. Vitamin D deficiency Your Vitamin D is low. Vitamin D is needed to make and keep bones strong.  Explained he will take vitamin D2 50,000 units weekly for the next 8 weeks.  Then he will take vitamin D 2000 iu over the counter  and take 1 tablet daily.  Will repeat your levels in 3 months.    Patient have been  counseled extensively about nutrition and exercise. Other issues discussed during this visit include: low cholesterol diet, weight control and daily exercise, foot care, annual eye examinations at Ophthalmology, importance of adherence with medications and regular follow-up. We also discussed long term complications of uncontrolled diabetes and hypertension.   Return in about 3 months (around 07/07/2022) for lipids/vit D .  The patient was given clear instructions to go to ER or return to medical center if symptoms don't improve, worsen or new problems develop. The patient verbalized understanding. The patient was told to call to get lab results if they haven't heard anything in the next week.   This note has been created with Surveyor, quantity. Any transcriptional errors are unintentional.   Kerin Perna, NP 04/07/2022, 2:40 PM

## 2022-05-08 ENCOUNTER — Telehealth (INDEPENDENT_AMBULATORY_CARE_PROVIDER_SITE_OTHER): Payer: Self-pay | Admitting: Primary Care

## 2022-05-08 DIAGNOSIS — I10 Essential (primary) hypertension: Secondary | ICD-10-CM

## 2022-05-08 DIAGNOSIS — E782 Mixed hyperlipidemia: Secondary | ICD-10-CM

## 2022-05-08 NOTE — Telephone Encounter (Signed)
Sharyn Lull from the pharmacy wants to know if the Fenofibrate has been discontinued.  CB#  [phone]

## 2022-05-08 NOTE — Telephone Encounter (Signed)
Requested Prescriptions  Pending Prescriptions Disp Refills   fenofibrate (TRICOR) 145 MG tablet [Pharmacy Med Name: Fenofibrate 145MG  TABS] 90 tablet 0    Sig: TAKE 1 TABLET BY MOUTH DAILY     Cardiovascular:  Antilipid - Fibric Acid Derivatives Failed - 05/08/2022  8:39 AM      Failed - Cr in normal range and within 360 days    Creatinine, Ser  Date Value Ref Range Status  05/14/2021 0.75 (L) 0.76 - 1.27 mg/dL Final         Failed - Lipid Panel in normal range within the last 12 months    Cholesterol, Total  Date Value Ref Range Status  02/20/2022 224 (H) 100 - 199 mg/dL Final   LDL Chol Calc (NIH)  Date Value Ref Range Status  02/20/2022 155 (H) 0 - 99 mg/dL Final   HDL  Date Value Ref Range Status  02/20/2022 37 (L) >39 mg/dL Final   Triglycerides  Date Value Ref Range Status  02/20/2022 172 (H) 0 - 149 mg/dL Final         Passed - ALT in normal range and within 360 days    ALT  Date Value Ref Range Status  05/14/2021 15 0 - 44 IU/L Final    Comment:    The specimen was lipemic.  The lipemia was cleared by ultracentrifugation before testing.  However HDL, direct LDL, cholesterol and triglyceride (if ordered) were performed prior to ultracentrifugation.          Passed - AST in normal range and within 360 days    AST  Date Value Ref Range Status  05/14/2021 16 0 - 40 IU/L Final    Comment:    The specimen was lipemic.  The lipemia was cleared by ultracentrifugation before testing.  However HDL, direct LDL, cholesterol and triglyceride (if ordered) were performed prior to ultracentrifugation.          Passed - HGB in normal range and within 360 days    Hemoglobin  Date Value Ref Range Status  05/14/2021 16.0 13.0 - 17.7 g/dL Final         Passed - HCT in normal range and within 360 days    Hematocrit  Date Value Ref Range Status  05/14/2021 45.2 37.5 - 51.0 % Final         Passed - PLT in normal range and within 360 days    Platelets  Date Value  Ref Range Status  05/14/2021 316 150 - 450 x10E3/uL Final         Passed - WBC in normal range and within 360 days    WBC  Date Value Ref Range Status  05/14/2021 4.2 3.4 - 10.8 x10E3/uL Final  12/20/2019 6.5 4.0 - 10.5 K/uL Final         Passed - eGFR is 30 or above and within 360 days    GFR calc Af Amer  Date Value Ref Range Status  12/20/2019 >60 >60 mL/min Final   GFR calc non Af Amer  Date Value Ref Range Status  12/20/2019 >60 >60 mL/min Final   eGFR  Date Value Ref Range Status  05/14/2021 120 >59 mL/min/1.73 Final         Passed - Valid encounter within last 12 months    Recent Outpatient Visits           1 month ago Vitamin D deficiency   [place] Kerin Perna, NP  2 months ago Type 2 diabetes mellitus without complication, with long-term current use of insulin ([place])   [place] [facility] Kerin Perna, NP   6 months ago Type 2 diabetes mellitus without complication, with long-term current use of insulin ([place])   [place] Kerin Perna, NP   8 months ago Type 2 diabetes mellitus without complication, with long-term current use of insulin ([place])   [place] Kerin Perna, NP   9 months ago Type 2 diabetes mellitus without complication, without long-term current use of insulin ([place])   [place], Stephen L, RPH-CPP       Future Appointments             In 2 months Oletta Lamas, Milford Cage, NP [facility]            Signed Prescriptions Disp Refills   hydrochlorothiazide (HYDRODIURIL) 25 MG tablet 90 tablet 0    Sig: TAKE 1 TABLET BY MOUTH DAILY     Cardiovascular: Diuretics - Thiazide Failed - 05/08/2022  8:39 AM      Failed - Cr in normal range and within 180 days    Creatinine, Ser  Date Value Ref Range Status  05/14/2021 0.75 (L) 0.76 - 1.27 mg/dL Final         Failed -  K in normal range and within 180 days    Potassium  Date Value Ref Range Status  05/14/2021 4.6 3.5 - 5.2 mmol/L Final         Failed - Na in normal range and within 180 days    Sodium  Date Value Ref Range Status  05/14/2021 133 (L) 134 - 144 mmol/L Final         Passed - Last BP in normal range    BP Readings from Last 1 Encounters:  04/07/22 112/82         Passed - Valid encounter within last 6 months    Recent Outpatient Visits           1 month ago Vitamin D deficiency   [place], Michelle P, NP   2 months ago Type 2 diabetes mellitus without complication, with long-term current use of insulin ([place])   [place] [facility] Kerin Perna, NP   6 months ago Type 2 diabetes mellitus without complication, with long-term current use of insulin ([place])   [place] [facility] Kerin Perna, NP   8 months ago Type 2 diabetes mellitus without complication, with long-term current use of insulin ([place])   [place] [facility] Kerin Perna, NP   9 months ago Type 2 diabetes mellitus without complication, without long-term current use of insulin ([place])   [place] Ausdall, Jarome Matin, RPH-CPP       Future Appointments             In 2 months Edwards, Milford Cage, NP [facility]             empagliflozin (JARDIANCE) 10 MG TABS tablet 90 tablet 0    Sig: TAKE 1 TABLET BY MOUTH DAILY BEFORE BREAKFAST     Endocrinology:  Diabetes - SGLT2 Inhibitors Failed - 05/08/2022  8:39 AM      Failed - Cr in normal range and within 360 days    Creatinine,  Ser  Date Value Ref Range Status  05/14/2021 0.75 (L) 0.76 - 1.27 mg/dL Final         Failed - HBA1C is between 0 and 7.9 and within 180 days    Hemoglobin A1C  Date Value Ref Range Status  11/01/2021 5.3 4.0 - 5.6 % Final   HbA1c, POC (controlled diabetic range)  Date Value Ref Range  Status  07/25/2021 8.2 (A) 0.0 - 7.0 % Final         Passed - eGFR in normal range and within 360 days    GFR calc Af Amer  Date Value Ref Range Status  12/20/2019 >60 >60 mL/min Final   GFR calc non Af Amer  Date Value Ref Range Status  12/20/2019 >60 >60 mL/min Final   eGFR  Date Value Ref Range Status  05/14/2021 120 >59 mL/min/1.73 Final         Passed - Valid encounter within last 6 months    Recent Outpatient Visits           1 month ago Vitamin D deficiency   [place] Kerin Perna, NP   2 months ago Type 2 diabetes mellitus without complication, with long-term current use of insulin ([place])   [place], Michelle P, NP   6 months ago Type 2 diabetes mellitus without complication, with long-term current use of insulin ([place])   [place], Michelle P, NP   8 months ago Type 2 diabetes mellitus without complication, with long-term current use of insulin ([place])   [place], Michelle P, NP   9 months ago Type 2 diabetes mellitus without complication, without long-term current use of insulin ([place])   [place], Jarome Matin, RPH-CPP       Future Appointments             In 2 months Edwards, Milford Cage, NP [facility]             JANUMET 50-1000 MG tablet 180 tablet 0    Sig: TAKE 1 TABLET BY MOUTH TWICE A DAY WITH MEALS     Endocrinology:  Diabetes - Biguanide + DPP-4 Inhibitor Combos Failed - 05/08/2022  8:39 AM      Failed - HBA1C is between 0 and 7.9 and within 180 days    Hemoglobin A1C  Date Value Ref Range Status  11/01/2021 5.3 4.0 - 5.6 % Final   HbA1c, POC (controlled diabetic range)  Date Value Ref Range Status  07/25/2021 8.2 (A) 0.0 - 7.0 % Final         Failed - Cr in normal range and within 360 days    Creatinine, Ser  Date Value Ref Range Status   05/14/2021 0.75 (L) 0.76 - 1.27 mg/dL Final         Failed - B12 Level in normal range and within 720 days    No results found for: "VITAMINB12"       Passed - eGFR in normal range and within 360 days    GFR calc Af Amer  Date Value Ref Range Status  12/20/2019 >60 >60 mL/min Final   GFR calc non Af Amer  Date Value Ref Range Status  12/20/2019 >60 >60 mL/min Final   eGFR  Date Value Ref Range Status  05/14/2021 120 >59 mL/min/1.73 Final  Passed - Valid encounter within last 6 months    Recent Outpatient Visits           1 month ago Vitamin D deficiency   [place] Kerin Perna, NP   2 months ago Type 2 diabetes mellitus without complication, with long-term current use of insulin ([place])   [place] Juluis Mire P, NP   6 months ago Type 2 diabetes mellitus without complication, with long-term current use of insulin ([place])   [place], Manderson P, NP   8 months ago Type 2 diabetes mellitus without complication, with long-term current use of insulin ([place])   [employer] Juluis Mire P, NP   9 months ago Type 2 diabetes mellitus without complication, without long-term current use of insulin [facility])   [place], Jarome Matin, RPH-CPP       Future Appointments             In 2 months Oletta Lamas, Milford Cage, NP [place]            Passed - CBC within normal limits and completed in the last 12 months    WBC  Date Value Ref Range Status  05/14/2021 4.2 3.4 - 10.8 x10E3/uL Final  12/20/2019 6.5 4.0 - 10.5 K/uL Final   RBC  Date Value Ref Range Status  05/14/2021 5.27 4.14 - 5.80 x10E6/uL Final  12/20/2019 4.94 4.22 - 5.81 MIL/uL Final   Hemoglobin  Date Value Ref Range Status  05/14/2021 16.0 13.0 - 17.7 g/dL Final   Hematocrit  Date Value Ref Range Status  05/14/2021 45.2  37.5 - 51.0 % Final   MCHC  Date Value Ref Range Status  05/14/2021 35.4 31.5 - 35.7 g/dL Final  12/20/2019 33.1 30.0 - 36.0 g/dL Final   [facility]  Date Value Ref Range Status  05/14/2021 30.4 26.6 - 33.0 pg Final  12/20/2019 28.5 26.0 - 34.0 pg Final   MCV  Date Value Ref Range Status  05/14/2021 86 79 - 97 fL Final   No results found for: "PLTCOUNTKUC", "LABPLAT", "POCPLA" RDW  Date Value Ref Range Status  05/14/2021 12.8 11.6 - 15.4 % Final          hydrOXYzine (ATARAX) 25 MG tablet 90 tablet 1    Sig: TAKE 1 TABLET BY MOUTH DAILY     Ear, Nose, and Throat:  Antihistamines 2 Failed - 05/08/2022  8:39 AM      Failed - Cr in normal range and within 360 days    Creatinine, Ser  Date Value Ref Range Status  05/14/2021 0.75 (L) 0.76 - 1.27 mg/dL Final         Passed - Valid encounter within last 12 months    Recent Outpatient Visits           1 month ago Vitamin D deficiency   [place] Kerin Perna, NP   2 months ago Type 2 diabetes mellitus without complication, with long-term current use of insulin ([place])   [place] [facility] Juluis Mire P, NP   6 months ago Type 2 diabetes mellitus without complication, with long-term current use of insulin ([place])   [place] [facility] Kerin Perna, NP   8 months ago Type 2 diabetes mellitus without complication, with long-term current use of insulin ([place])   [place]  [facility] Grayce Sessions, NP   9 months ago Type 2 diabetes mellitus without complication, without long-term current use of insulin [facility])   [place] [facility] Lois Huxley, Cornelius Moras, RPH-CPP       Future Appointments             In 2 months Randa Evens, Kinnie Scales, NP [facility]

## 2022-05-08 NOTE — Telephone Encounter (Signed)
Requested Prescriptions  Pending Prescriptions Disp Refills   hydrochlorothiazide (HYDRODIURIL) 25 MG tablet [Pharmacy Med Name: hydroCHLOROthiazide 25MG  TABS*] 90 tablet 0    Sig: TAKE 1 TABLET BY MOUTH DAILY     Cardiovascular: Diuretics - Thiazide Failed - 05/08/2022  8:39 AM      Failed - Cr in normal range and within 180 days    Creatinine, Ser  Date Value Ref Range Status  05/14/2021 0.75 (L) 0.76 - 1.27 mg/dL Final         Failed - K in normal range and within 180 days    Potassium  Date Value Ref Range Status  05/14/2021 4.6 3.5 - 5.2 mmol/L Final         Failed - Na in normal range and within 180 days    Sodium  Date Value Ref Range Status  05/14/2021 133 (L) 134 - 144 mmol/L Final         Passed - Last BP in normal range    BP Readings from Last 1 Encounters:  04/07/22 112/82         Passed - Valid encounter within last 6 months    Recent Outpatient Visits           1 month ago Vitamin D deficiency   [facility], NP   2 months ago Type 2 diabetes mellitus without complication, with long-term current use of insulin (H[facility])   [place] [facility] Grayce Sessions, NP   6 months ago Type 2 diabetes mellitus without complication, with long-term current use of insulin (H[facility])   [place] [facility] Grayce Sessions, NP   8 months ago Type 2 diabetes mellitus without complication, with long-term current use of insulin ([facility])   [place] [facility] Grayce Sessions, NP   9 months ago Type 2 diabetes mellitus without complication, without long-term current use of insulin (HCC)   [place] [facility] [place], [place], RPH-CPP       Future Appointments             In 2 months Edwards, Cornelius Moras, NP [facility]             JARDIANCE 10 MG TABS tablet [Pharmacy Med Name: Jardiance 10MG  TABS] 90 tablet 0    Sig: TAKE 1 TABLET  BY MOUTH DAILY BEFORE BREAKFAST     Endocrinology:  Diabetes - SGLT2 Inhibitors Failed - 05/08/2022  8:39 AM      Failed - Cr in normal range and within 360 days    Creatinine, Ser  Date Value Ref Range Status  05/14/2021 0.75 (L) 0.76 - 1.27 mg/dL Final         Failed - HBA1C is between 0 and 7.9 and within 180 days    Hemoglobin A1C  Date Value Ref Range Status  11/01/2021 5.3 4.0 - 5.6 % Final   HbA1c, POC (controlled diabetic range)  Date Value Ref Range Status  07/25/2021 8.2 (A) 0.0 - 7.0 % Final         Passed - eGFR in normal range and within 360 days    GFR calc Af Amer  Date Value Ref Range Status  12/20/2019 >60 >60 mL/min Final   GFR calc non Af Amer  Date Value Ref Range Status  12/20/2019 >60 >60 mL/min Final   eGFR  Date Value Ref Range Status  05/14/2021 120 >  59 mL/min/1.73 Final         Passed - Valid encounter within last 6 months    Recent Outpatient Visits           1 month ago Vitamin D deficiency   [place] Kerin Perna, NP   2 months ago Type 2 diabetes mellitus without complication, with long-term current use of insulin ([place])   [place] Kerin Perna, NP   6 months ago Type 2 diabetes mellitus without complication, with long-term current use of insulin ([place])   [place] Kerin Perna, NP   8 months ago Type 2 diabetes mellitus without complication, with long-term current use of insulin ([place])   [place] Kerin Perna, NP   9 months ago Type 2 diabetes mellitus without complication, without long-term current use of insulin ([place])   [place], RPH-CPP       Future Appointments             In 2 months Edwards, Milford Cage, NP [facility]             JANUMET 50-1000 MG tablet [Pharmacy Med Name: Janumet 50-1000MG  TABS] 180 tablet 0     Sig: TAKE 1 TABLET BY [place]     Endocrinology:  Diabetes - Biguanide + DPP-4 Inhibitor Combos Failed - 05/08/2022  8:39 AM      Failed - HBA1C is between 0 and 7.9 and within 180 days    Hemoglobin A1C  Date Value Ref Range Status  11/01/2021 5.3 4.0 - 5.6 % Final   HbA1c, POC (controlled diabetic range)  Date Value Ref Range Status  07/25/2021 8.2 (A) 0.0 - 7.0 % Final         Failed - Cr in normal range and within 360 days    Creatinine, Ser  Date Value Ref Range Status  05/14/2021 0.75 (L) 0.76 - 1.27 mg/dL Final         Failed - B12 Level in normal range and within 720 days    No results found for: "VITAMINB12"       Passed - eGFR in normal range and within 360 days    GFR calc Af Amer  Date Value Ref Range Status  12/20/2019 >60 >60 mL/min Final   GFR calc non Af Amer  Date Value Ref Range Status  12/20/2019 >60 >60 mL/min Final   eGFR  Date Value Ref Range Status  05/14/2021 120 >59 mL/min/1.73 Final         Passed - Valid encounter within last 6 months    Recent Outpatient Visits           1 month ago Vitamin D deficiency   [place] Kerin Perna, NP   2 months ago Type 2 diabetes mellitus without complication, with long-term current use of insulin ([place])   [place] [facility] Kerin Perna, NP   6 months ago Type 2 diabetes mellitus without complication, with long-term current use of insulin ([place])   [place] [facility] Juluis Mire P, NP   8 months ago Type 2 diabetes mellitus without complication, with long-term current use of insulin ([place])   [facility] Kerin Perna, NP   9 months ago Type 2 diabetes mellitus without complication,  without long-term current use of insulin [facility])   [facility] [place], Cornelius Moras, RPH-CPP       Future Appointments             In 2 months Randa Evens, Kinnie Scales, NP [facility]  [facility]            Passed - CBC within normal limits and completed in the last 12 months    WBC  Date Value Ref Range Status  05/14/2021 4.2 3.4 - 10.8 x10E3/uL Final  12/20/2019 6.5 4.0 - 10.5 K/uL Final   RBC  Date Value Ref Range Status  05/14/2021 5.27 4.14 - 5.80 x10E6/uL Final  12/20/2019 4.94 4.22 - 5.81 MIL/uL Final   Hemoglobin  Date Value Ref Range Status  05/14/2021 16.0 13.0 - 17.7 g/dL Final   Hematocrit  Date Value Ref Range Status  05/14/2021 45.2 37.5 - 51.0 % Final   MCHC  Date Value Ref Range Status  05/14/2021 35.4 31.5 - 35.7 g/dL Final  74/25/9563 87.5 30.0 - 36.0 g/dL Final   [facility]  Date Value Ref Range Status  05/14/2021 30.4 26.6 - 33.0 pg Final  12/20/2019 28.5 26.0 - 34.0 pg Final   MCV  Date Value Ref Range Status  05/14/2021 86 79 - 97 fL Final   No results found for: "PLTCOUNTKUC", "LABPLAT", "POCPLA" RDW  Date Value Ref Range Status  05/14/2021 12.8 11.6 - 15.4 % Final          fenofibrate (TRICOR) 145 MG tablet [Pharmacy Med Name: Fenofibrate 145MG  TABS] 90 tablet 0    Sig: TAKE 1 TABLET BY MOUTH DAILY     Cardiovascular:  Antilipid - Fibric Acid Derivatives Failed - 05/08/2022  8:39 AM      Failed - Cr in normal range and within 360 days    Creatinine, Ser  Date Value Ref Range Status  05/14/2021 0.75 (L) 0.76 - 1.27 mg/dL Final         Failed - Lipid Panel in normal range within the last 12 months    Cholesterol, Total  Date Value Ref Range Status  02/20/2022 224 (H) 100 - 199 mg/dL Final   LDL Chol Calc (NIH)  Date Value Ref Range Status  02/20/2022 155 (H) 0 - 99 mg/dL Final   HDL  Date Value Ref Range Status  02/20/2022 37 (L) >39 mg/dL Final   Triglycerides  Date Value Ref Range Status  02/20/2022 172 (H) 0 - 149 mg/dL Final         Passed - ALT in normal range and within 360 days    ALT  Date Value Ref Range Status  05/14/2021 15 0 - 44 IU/L Final    Comment:    The specimen  was lipemic.  The lipemia was cleared by ultracentrifugation before testing.  However HDL, direct LDL, cholesterol and triglyceride (if ordered) were performed prior to ultracentrifugation.          Passed - AST in normal range and within 360 days    AST  Date Value Ref Range Status  05/14/2021 16 0 - 40 IU/L Final    Comment:    The specimen was lipemic.  The lipemia was cleared by ultracentrifugation before testing.  However HDL, direct LDL, cholesterol and triglyceride (if ordered) were performed prior to ultracentrifugation.          Passed - HGB in normal range and within 360 days    Hemoglobin  Date Value Ref Range Status  05/14/2021 16.0 13.0 - 17.7 g/dL Final         Passed - HCT in normal range and within 360 days    Hematocrit  Date Value Ref Range Status  05/14/2021 45.2 37.5 - 51.0 % Final         Passed - PLT in normal range and within 360 days    Platelets  Date Value Ref Range Status  05/14/2021 316 150 - 450 x10E3/uL Final         Passed - WBC in normal range and within 360 days    WBC  Date Value Ref Range Status  05/14/2021 4.2 3.4 - 10.8 x10E3/uL Final  12/20/2019 6.5 4.0 - 10.5 K/uL Final         Passed - eGFR is 30 or above and within 360 days    GFR calc Af Amer  Date Value Ref Range Status  12/20/2019 >60 >60 mL/min Final   GFR calc non Af Amer  Date Value Ref Range Status  12/20/2019 >60 >60 mL/min Final   eGFR  Date Value Ref Range Status  05/14/2021 120 >59 mL/min/1.73 Final         Passed - Valid encounter within last 12 months    Recent Outpatient Visits           1 month ago Vitamin D deficiency   [place], Michelle P, NP   2 months ago Type 2 diabetes mellitus without complication, with long-term current use of insulin ([place])   [place], Michelle P, NP   6 months ago Type 2 diabetes mellitus without complication, with long-term current use of insulin ([place])    [place] [facility] Juluis Mire P, NP   8 months ago Type 2 diabetes mellitus without complication, with long-term current use of insulin ([place])   [place] [facility] Juluis Mire P, NP   9 months ago Type 2 diabetes mellitus without complication, without long-term current use of insulin ([place])   [place], Jarome Matin, RPH-CPP       Future Appointments             In 2 months Edwards, Milford Cage, NP [facility]             hydrOXYzine (ATARAX) 25 MG tablet [Pharmacy Med Name: hydrOXYzine HCl 25MG  TABS*] 90 tablet 1    Sig: TAKE 1 TABLET BY MOUTH DAILY     Ear, Nose, and Throat:  Antihistamines 2 Failed - 05/08/2022  8:39 AM      Failed - Cr in normal range and within 360 days    Creatinine, Ser  Date Value Ref Range Status  05/14/2021 0.75 (L) 0.76 - 1.27 mg/dL Final         Passed - Valid encounter within last 12 months    Recent Outpatient Visits           1 month ago Vitamin D deficiency   [place] Kerin Perna, NP   2 months ago Type 2 diabetes mellitus without complication, with long-term current use of insulin ([place])   [place] [facility] Juluis Mire P, NP   6 months ago Type 2 diabetes mellitus without complication, with long-term current use of insulin ([place])   [facility] Kerin Perna, NP   8  months ago Type 2 diabetes mellitus without complication, with long-term current use of insulin (HCC)   [place] [facility] Grayce Sessions, NP   9 months ago Type 2 diabetes mellitus without complication, without long-term current use of insulin [facility])   [facility] Lois Huxley, Cornelius Moras, RPH-CPP       Future Appointments             In 2 months Randa Evens, Kinnie Scales, NP [facility]

## 2022-05-08 NOTE — Telephone Encounter (Signed)
It's not on medication list want to make sure before returning call to pharmacy

## 2022-05-09 NOTE — Telephone Encounter (Signed)
Returned [place] and made her aware that Fenofibrate was D/C. Per Sharyn Lull pt has still been taking it and per Sharyn Lull it's in his pack for this week but after that it will not be

## 2022-05-26 ENCOUNTER — Ambulatory Visit (INDEPENDENT_AMBULATORY_CARE_PROVIDER_SITE_OTHER): Payer: Medicaid Other | Admitting: Primary Care

## 2022-06-19 ENCOUNTER — Other Ambulatory Visit (INDEPENDENT_AMBULATORY_CARE_PROVIDER_SITE_OTHER): Payer: Self-pay | Admitting: Primary Care

## 2022-06-19 NOTE — Telephone Encounter (Signed)
Requested medications are due for refill today.  Provider to determine  Requested medications are on the active medications list.  yes  Last refill. 04/07/2022 #8 0 rf  Future visit scheduled.   yes  Notes to clinic.  Refill not delegated.    Requested Prescriptions  Pending Prescriptions Disp Refills   Vitamin D, Ergocalciferol, (DRISDOL) 1.25 MG (50000 UNIT) CAPS capsule [Pharmacy Med Name: Vitamin D (Ergocalciferol) 1.25 MG(50000 UT) CAPS] 8 capsule 0    Sig: TAKE 1 CAPSULE BY MOUTH ONCE EVERY SEVEN DAYS     Endocrinology:  Vitamins - Vitamin D Supplementation 2 Failed - 06/19/2022 11:25 AM      Failed - Manual Review: Route requests for 50,000 IU strength to the provider      Failed - Ca in normal range and within 360 days    Calcium  Date Value Ref Range Status  05/14/2021 9.8 8.7 - 10.2 mg/dL Final   Calcium, Ion  Date Value Ref Range Status  10/10/2016 1.52 (HH) 1.15 - 1.40 mmol/L Final         Failed - Vitamin D in normal range and within 360 days    Vit D, 25-Hydroxy  Date Value Ref Range Status  02/20/2022 15.1 (L) 30.0 - 100.0 ng/mL Final    Comment:    Vitamin D deficiency has been defined by the [place] practice guideline as a level of serum 25-OH vitamin D less than 20 ng/mL (1,2). The Endocrine Society went on to further define vitamin D insufficiency as a level between 21 and 29 ng/mL (2). 1. IOM (Institute of Medicine). 2010. Dietary reference    intakes for calcium and D. [place]: The    [employer]. 2. Holick MF, Binkley [place], Bischoff-Ferrari HA, et al.    Evaluation, treatment, and prevention of vitamin D    deficiency: an Endocrine Society clinical practice    guideline. JCEM. 2011 Jul; 96(7):1911-30.          Passed - Valid encounter within last 12 months    Recent Outpatient Visits           2 months ago Vitamin D deficiency   [place] Kerin Perna, NP   3 months ago Type 2 diabetes mellitus without complication, with long-term current use of insulin ([place])   [place] Kerin Perna, NP   7 months ago Type 2 diabetes mellitus without complication, with long-term current use of insulin ([place])   [place], Michelle P, NP   9 months ago Type 2 diabetes mellitus without complication, with long-term current use of insulin ([place])   [place], Michelle P, NP   10 months ago Type 2 diabetes mellitus without complication, without long-term current use of insulin [facility])   [place], RPH-CPP       Future Appointments             In 2 weeks Oletta Lamas, Milford Cage, NP [place]

## 2022-06-20 ENCOUNTER — Other Ambulatory Visit: Payer: Self-pay

## 2022-06-20 NOTE — Telephone Encounter (Signed)
Will forward to provider  

## 2022-06-26 ENCOUNTER — Other Ambulatory Visit (INDEPENDENT_AMBULATORY_CARE_PROVIDER_SITE_OTHER): Payer: Self-pay | Admitting: Primary Care

## 2022-06-26 MED ORDER — VITAMIN D3 50 MCG (2000 UT) PO CAPS: 2000.0000 [IU] | ORAL_CAPSULE | Freq: Every day | ORAL | 1 refills | Status: AC

## 2022-07-07 ENCOUNTER — Ambulatory Visit (INDEPENDENT_AMBULATORY_CARE_PROVIDER_SITE_OTHER): Payer: Medicaid Other | Admitting: Primary Care

## 2022-07-16 ENCOUNTER — Ambulatory Visit (INDEPENDENT_AMBULATORY_CARE_PROVIDER_SITE_OTHER): Payer: Medicaid Other | Admitting: Primary Care

## 2022-07-16 ENCOUNTER — Encounter (INDEPENDENT_AMBULATORY_CARE_PROVIDER_SITE_OTHER): Payer: Self-pay | Admitting: Primary Care

## 2022-07-16 VITALS — BP 133/88 | HR 80 | Resp 16 | Wt 224.4 lb

## 2022-07-16 DIAGNOSIS — E119 Type 2 diabetes mellitus without complications: Secondary | ICD-10-CM | POA: Diagnosis not present

## 2022-07-16 DIAGNOSIS — I10 Essential (primary) hypertension: Secondary | ICD-10-CM | POA: Diagnosis not present

## 2022-07-16 DIAGNOSIS — E782 Mixed hyperlipidemia: Secondary | ICD-10-CM

## 2022-07-16 DIAGNOSIS — Z794 Long term (current) use of insulin: Secondary | ICD-10-CM | POA: Diagnosis not present

## 2022-07-16 DIAGNOSIS — E559 Vitamin D deficiency, unspecified: Secondary | ICD-10-CM

## 2022-07-16 LAB — POCT GLYCOSYLATED HEMOGLOBIN (HGB A1C): HbA1c, POC (controlled diabetic range): 5.8 % (ref 0.0–7.0)

## 2022-07-16 NOTE — Progress Notes (Signed)
Jacob Price, is a 37 y.o. male  [id number]  [id number]  DOB - [birth year]  Chief Complaint  Patient presents with   Diabetes   Hypertension       Subjective:   [name] is a 37 y.o. obese male here today for a follow up visit for the management manage type 2 diabetes and hypertension. Patient has No headache, No chest pain, No abdominal pain - No Nausea, No new weakness tingling or numbness, No Cough - shortness of breath.  Patient denies polyuria, polydipsia, polyphagia or vision changes.  He is adherent to taking his blood pressure and diabetes medication.  He stays active by walking and monitoring what he is eating.  A1c today 5.8  No problems updated.  No Known Allergies  Past Medical History:  Diagnosis Date   Asperger's syndrome    Diabetes mellitus without complication ([place])    Hypertension     Current Outpatient Medications on File Prior to Visit  Medication Sig Dispense Refill   Accu-Chek Softclix Lancets lancets USE AS DIRECTED THREE TIMES DAILY 100 each 3   benztropine (COGENTIN) 0.5 MG tablet Take 0.5 mg by mouth 2 (two) times daily.     blood glucose meter kit and supplies Dispense based on patient and insurance preference. Use up to four times daily as directed. (FOR ICD-10 E10.9, E11.9). 1 each 0   Blood Glucose Monitoring Suppl (ACCU-CHEK AVIVA PLUS) w/Device KIT See admin instructions.  1   Cholecalciferol (VITAMIN D3) 50 MCG (2000 UT) CAPS Take 1 capsule (2,000 Units total) by mouth daily. 90 capsule 1   empagliflozin (JARDIANCE) 10 MG TABS tablet TAKE 1 TABLET BY MOUTH DAILY BEFORE BREAKFAST 90 tablet 0   glucose blood (ACCU-CHEK AVIVA PLUS) test strip Use TID 100 each 11   hydrochlorothiazide (HYDRODIURIL) 25 MG tablet TAKE 1 TABLET BY MOUTH DAILY 90 tablet 0   hydrOXYzine (ATARAX) 25 MG tablet TAKE 1 TABLET BY MOUTH DAILY 90 tablet 1   Insulin Pen Needle 29G X 12.7MM MISC Insulin administration 5  times  daily 150 each 11   JANUMET 50-1000 MG tablet TAKE 1 TABLET BY MOUTH TWICE A DAY WITH MEALS 180 tablet 0   Lancets Misc. (ACCU-CHEK FASTCLIX LANCET) KIT Use as directed three times daily 1 kit 0   risperiDONE (RISPERDAL) 2 MG tablet Take 2 mg by mouth 3 (three) times daily.     risperiDONE microspheres (RISPERDAL CONSTA) 25 MG injection Inject 25 mg into the muscle every 14 (fourteen) days.     rosuvastatin (CRESTOR) 40 MG tablet Take 1 tablet (40 mg total) by mouth daily. 90 tablet 1   No current facility-administered medications on file prior to visit.    Objective:   Vitals:   07/16/22 1023  BP: 133/88  Pulse: 80  Resp: 16  SpO2: 99%  Weight: 224 lb 6.4 oz (101.8 kg)    Comprehensive ROS Pertinent positive and negative noted in HPI   Exam General appearance : Awake, alert, not in any distress. Speech Clear. Not toxic looking HEENT: Atraumatic and Normocephalic, pupils equally reactive to light and accomodation Neck: Supple, no JVD. No cervical lymphadenopathy.  Chest: Good air entry bilaterally, no added sounds  CVS: S1 S2 regular, no murmurs.  Abdomen: Bowel sounds present, Non tender and not distended with no gaurding, rigidity or rebound. Extremities: B/L Lower Ext shows no edema, both legs are warm to touch Neurology: Awake alert, and oriented X 3, , Non focal Skin: No  Rash  Data Review Lab Results  Component Value Date   HGBA1C 5.3 11/01/2021   HGBA1C 8.2 (A) 07/25/2021   HGBA1C  05/14/2021     Comment:     a1c too high to read.. code 106= A1c greater than 15    [place] was seen today for diabetes and hypertension.  Diagnoses and all orders for this visit:  Type 2 diabetes mellitus without complication, with long-term current use of insulin (HCC) -     POCT glycosylated hemoglobin (Hb A1C) 5.8  - educated on lifestyle modifications, including but not limited to diet choices and adding exercise to daily routine.    Mixed hyperlipidemia On  rosuvastatin 40 mg .  Healthy lifestyle diet of fruits vegetables fish nuts whole grains and low saturated fat . Foods high in cholesterol or liver, fatty meats,cheese, butter avocados, nuts and seeds, chocolate and fried foods. Lipid    Vitamin D deficiency Vitamin D 2000iu daily  Repeating labs Vitamin D   Hypertension, unspecified type BP goal - < 130/80 (acceptable very close to goal) Explained that having normal blood pressure is the goal and medications are helping to get to goal and maintain normal blood pressure. DIET: Limit salt intake, read nutrition labels to check salt content, limit fried and high fatty foods  Avoid using multisymptom OTC cold preparations that generally contain sudafed which can rise BP. Consult with pharmacist on best cold relief products to use for persons with HTN EXERCISE Discussed incorporating exercise such as walking - 30 minutes most days of the week and can do in 10 minute intervals      Patient have been counseled extensively about nutrition and exercise. Other issues discussed during this visit include: low cholesterol diet, weight control and daily exercise, foot care, annual eye examinations at Ophthalmology, importance of adherence with medications and regular follow-up. We also discussed long term complications of uncontrolled diabetes and hypertension.   No follow-ups on file.  The patient was given clear instructions to go to ER or return to medical center if symptoms don't improve, worsen or new problems develop. The patient verbalized understanding. The patient was told to call to get lab results if they haven't heard anything in the next week.   This note has been created with Surveyor, quantity. Any transcriptional errors are unintentional.   Kerin Perna, NP 07/16/2022, 10:25 AM

## 2022-07-17 ENCOUNTER — Other Ambulatory Visit (INDEPENDENT_AMBULATORY_CARE_PROVIDER_SITE_OTHER): Payer: Self-pay | Admitting: Primary Care

## 2022-07-17 DIAGNOSIS — E782 Mixed hyperlipidemia: Secondary | ICD-10-CM

## 2022-07-17 LAB — CMP14+EGFR
ALT: 27 IU/L (ref 0–44)
AST: 27 IU/L (ref 0–40)
Albumin/Globulin Ratio: 1.6 (ref 1.2–2.2)
Albumin: 5 g/dL (ref 4.1–5.1)
Alkaline Phosphatase: 77 IU/L (ref 44–121)
BUN/Creatinine Ratio: 12 (ref 9–20)
BUN: 15 mg/dL (ref 6–20)
Bilirubin Total: 0.4 mg/dL (ref 0.0–1.2)
CO2: 25 mmol/L (ref 20–29)
Calcium: 10.6 mg/dL — ABNORMAL HIGH (ref 8.7–10.2)
Chloride: 97 mmol/L (ref 96–106)
Creatinine, Ser: 1.21 mg/dL (ref 0.76–1.27)
Globulin, Total: 3.2 g/dL (ref 1.5–4.5)
Glucose: 78 mg/dL (ref 70–99)
Potassium: 3.8 mmol/L (ref 3.5–5.2)
Sodium: 140 mmol/L (ref 134–144)
Total Protein: 8.2 g/dL (ref 6.0–8.5)
eGFR: 79 mL/min/{1.73_m2} (ref >59)

## 2022-07-17 LAB — LIPID PANEL
Chol/HDL Ratio: 4.2 ratio (ref 0.0–5.0)
Cholesterol, Total: 176 mg/dL (ref 100–199)
HDL: 42 mg/dL (ref >39)
LDL Chol Calc (NIH): 107 mg/dL — ABNORMAL HIGH (ref 0–99)
Triglycerides: 154 mg/dL — ABNORMAL HIGH (ref 0–149)
VLDL Cholesterol Cal: 27 mg/dL (ref 5–40)

## 2022-07-17 LAB — VITAMIN D 25 HYDROXY (VIT D DEFICIENCY, FRACTURES): Vit D, 25-Hydroxy: 41.7 ng/mL (ref 30.0–100.0)

## 2022-07-17 MED ORDER — ROSUVASTATIN CALCIUM 40 MG PO TABS: 40.0000 mg | ORAL_TABLET | Freq: Every day | ORAL | 1 refills | Status: DC

## 2022-07-21 ENCOUNTER — Encounter (INDEPENDENT_AMBULATORY_CARE_PROVIDER_SITE_OTHER): Payer: Self-pay

## 2022-08-01 ENCOUNTER — Telehealth (INDEPENDENT_AMBULATORY_CARE_PROVIDER_SITE_OTHER): Payer: Self-pay

## 2022-08-01 NOTE — Telephone Encounter (Signed)
Pt called for lab results. Shared provider's note.  Grayce Sessions, NP 07/17/2022  3:00 PM EDT     Labs are  normal. Try to drink at least 48 oz of water per day. Work on eating a low fat, heart healthy diet and participate in regular aerobic exercise program to control as well. Exercise at least  30 minutes per day-5 days per week. Monitor eating red meat, fried foods,  junk foods, sodas, sugary foods or drinks, unhealthy snacking, alcohol or smoking.    Continue all your meds.   No questions at this time.

## 2022-09-15 ENCOUNTER — Other Ambulatory Visit (INDEPENDENT_AMBULATORY_CARE_PROVIDER_SITE_OTHER): Payer: Self-pay | Admitting: Primary Care

## 2022-09-15 DIAGNOSIS — I10 Essential (primary) hypertension: Secondary | ICD-10-CM

## 2022-09-15 NOTE — Telephone Encounter (Signed)
Will forward to provider  

## 2022-09-16 NOTE — Telephone Encounter (Signed)
Requested Prescriptions  Pending Prescriptions Disp Refills   JARDIANCE 10 MG TABS tablet [Pharmacy Med Name: Jardiance 10MG  TABS] 90 tablet 0    Sig: TAKE 1 TABLET BY MOUTH DAILY BEFORE BREAKFAST     Endocrinology:  Diabetes - SGLT2 Inhibitors Passed - 09/15/2022 10:48 AM      Passed - Cr in normal range and within 360 days    Creatinine, Ser  Date Value Ref Range Status  07/16/2022 1.21 0.76 - 1.27 mg/dL Final         Passed - HBA1C is between 0 and 7.9 and within 180 days    HbA1c, POC (controlled diabetic range)  Date Value Ref Range Status  07/16/2022 5.8 0.0 - 7.0 % Final         Passed - eGFR in normal range and within 360 days    GFR calc Af Amer  Date Value Ref Range Status  12/20/2019 >60 >60 mL/min Final   GFR calc non Af Amer  Date Value Ref Range Status  12/20/2019 >60 >60 mL/min Final   eGFR  Date Value Ref Range Status  07/16/2022 79 >59 mL/min/1.73 Final         Passed - Valid encounter within last 6 months    Recent Outpatient Visits           2 months ago Type 2 diabetes mellitus without complication, with long-term current use of insulin (HCC)   [place] [facility] Grayce Sessions, NP   5 months ago Vitamin D deficiency   [place] [facility] Grayce Sessions, NP   6 months ago Type 2 diabetes mellitus without complication, with long-term current use of insulin (HCC)   [place] [facility] Grayce Sessions, NP   10 months ago Type 2 diabetes mellitus without complication, with long-term current use of insulin (HCC)   [place] [facility] Grayce Sessions, NP   1 year ago Type 2 diabetes mellitus without complication, with long-term current use of insulin (HCC)   [place] [facility] Grayce Sessions, NP       Future Appointments             In 1 month [place], Kinnie Scales, NP [place] [facility]              hydrochlorothiazide (HYDRODIURIL) 25 MG tablet [Pharmacy Med Name: hydroCHLOROthiazide 25MG  TABS*] 90 tablet 0    Sig: TAKE 1 TABLET BY MOUTH DAILY     Cardiovascular: Diuretics - Thiazide Passed - 09/15/2022 10:48 AM      Passed - Cr in normal range and within 180 days    Creatinine, Ser  Date Value Ref Range Status  07/16/2022 1.21 0.76 - 1.27 mg/dL Final         Passed - K in normal range and within 180 days    Potassium  Date Value Ref Range Status  07/16/2022 3.8 3.5 - 5.2 mmol/L Final         Passed - Na in normal range and within 180 days    Sodium  Date Value Ref Range Status  07/16/2022 140 134 - 144 mmol/L Final         Passed - Last BP in normal range    BP Readings from Last 1 Encounters:  07/16/22 133/88         Passed - Valid encounter within last 6 months    Recent Outpatient Visits  2 months ago Type 2 diabetes mellitus without complication, with long-term current use of insulin (HCC)   [place] [facility] Grayce Sessions, NP   5 months ago Vitamin D deficiency   [place] [facility] Grayce Sessions, NP   6 months ago Type 2 diabetes mellitus without complication, with long-term current use of insulin (HCC)   [place] [facility] Grayce Sessions, NP   10 months ago Type 2 diabetes mellitus without complication, with long-term current use of insulin (HCC)   [place] [facility] Grayce Sessions, NP   1 year ago Type 2 diabetes mellitus without complication, with long-term current use of insulin [facility])   [place] [facility] Grayce Sessions, NP       Future Appointments             In 1 month Randa Evens, Kinnie Scales, NP [place] [facility]

## 2022-10-16 ENCOUNTER — Ambulatory Visit (INDEPENDENT_AMBULATORY_CARE_PROVIDER_SITE_OTHER): Payer: Medicaid Other | Admitting: Primary Care

## 2022-10-28 ENCOUNTER — Ambulatory Visit (INDEPENDENT_AMBULATORY_CARE_PROVIDER_SITE_OTHER): Payer: Medicaid Other | Admitting: Primary Care

## 2022-11-10 ENCOUNTER — Ambulatory Visit (INDEPENDENT_AMBULATORY_CARE_PROVIDER_SITE_OTHER): Payer: Medicaid Other | Admitting: Primary Care

## 2022-12-02 ENCOUNTER — Ambulatory Visit (INDEPENDENT_AMBULATORY_CARE_PROVIDER_SITE_OTHER): Payer: Medicaid Other | Admitting: Primary Care

## 2022-12-17 ENCOUNTER — Ambulatory Visit (INDEPENDENT_AMBULATORY_CARE_PROVIDER_SITE_OTHER): Payer: Medicaid Other | Admitting: Primary Care

## 2023-01-01 ENCOUNTER — Ambulatory Visit (INDEPENDENT_AMBULATORY_CARE_PROVIDER_SITE_OTHER): Payer: Medicaid Other | Admitting: Primary Care

## 2023-01-22 ENCOUNTER — Ambulatory Visit (INDEPENDENT_AMBULATORY_CARE_PROVIDER_SITE_OTHER): Payer: Medicaid Other | Admitting: Primary Care

## 2023-02-19 ENCOUNTER — Ambulatory Visit (INDEPENDENT_AMBULATORY_CARE_PROVIDER_SITE_OTHER): Payer: Medicaid Other | Admitting: Primary Care

## 2023-03-19 ENCOUNTER — Telehealth (INDEPENDENT_AMBULATORY_CARE_PROVIDER_SITE_OTHER): Payer: Self-pay | Admitting: Primary Care

## 2023-03-19 NOTE — Telephone Encounter (Signed)
 Called to confirm apt. Pt will be present.

## 2023-03-20 ENCOUNTER — Ambulatory Visit (INDEPENDENT_AMBULATORY_CARE_PROVIDER_SITE_OTHER): Payer: MEDICAID | Admitting: Primary Care

## 2023-04-10 ENCOUNTER — Ambulatory Visit (INDEPENDENT_AMBULATORY_CARE_PROVIDER_SITE_OTHER): Payer: MEDICAID | Admitting: Primary Care

## 2023-04-23 ENCOUNTER — Telehealth (INDEPENDENT_AMBULATORY_CARE_PROVIDER_SITE_OTHER): Payer: Self-pay | Admitting: Primary Care

## 2023-04-23 NOTE — Telephone Encounter (Signed)
 Pt will be present at apt.

## 2023-04-24 ENCOUNTER — Ambulatory Visit (INDEPENDENT_AMBULATORY_CARE_PROVIDER_SITE_OTHER): Payer: MEDICAID | Admitting: Primary Care

## 2023-05-15 ENCOUNTER — Ambulatory Visit (INDEPENDENT_AMBULATORY_CARE_PROVIDER_SITE_OTHER): Payer: MEDICAID | Admitting: Primary Care

## 2023-05-29 ENCOUNTER — Ambulatory Visit (INDEPENDENT_AMBULATORY_CARE_PROVIDER_SITE_OTHER): Payer: MEDICAID | Admitting: Primary Care

## 2023-06-12 ENCOUNTER — Other Ambulatory Visit (INDEPENDENT_AMBULATORY_CARE_PROVIDER_SITE_OTHER): Payer: MEDICAID

## 2023-06-15 ENCOUNTER — Telehealth: Payer: Self-pay

## 2023-06-15 NOTE — Telephone Encounter (Signed)
error 

## 2023-06-15 NOTE — Telephone Encounter (Signed)
 Copied from CRM [id number]. Topic: Clinical - Request for Lab/Test Order >> Jun 12, 2023  4:55 PM Martha Clan wrote: Reason for CRM: Had a lab appointment for Feb 14th and was unable to attend. Patient is needing to reschedule for Feb 28th.

## 2023-06-26 ENCOUNTER — Other Ambulatory Visit (INDEPENDENT_AMBULATORY_CARE_PROVIDER_SITE_OTHER): Payer: MEDICAID

## 2023-07-03 ENCOUNTER — Other Ambulatory Visit (INDEPENDENT_AMBULATORY_CARE_PROVIDER_SITE_OTHER): Payer: MEDICAID

## 2023-07-10 ENCOUNTER — Other Ambulatory Visit (INDEPENDENT_AMBULATORY_CARE_PROVIDER_SITE_OTHER): Payer: MEDICAID

## 2023-07-16 ENCOUNTER — Telehealth (INDEPENDENT_AMBULATORY_CARE_PROVIDER_SITE_OTHER): Payer: Self-pay | Admitting: Primary Care

## 2023-07-16 NOTE — Telephone Encounter (Signed)
 Called pt to reschedule appt because provider will not be in office on the day scheduled. Pt said they wanted to call back after reviewing schedule.

## 2023-07-17 ENCOUNTER — Other Ambulatory Visit (INDEPENDENT_AMBULATORY_CARE_PROVIDER_SITE_OTHER): Payer: MEDICAID

## 2023-07-20 ENCOUNTER — Other Ambulatory Visit: Payer: MEDICAID

## 2023-07-24 ENCOUNTER — Other Ambulatory Visit (INDEPENDENT_AMBULATORY_CARE_PROVIDER_SITE_OTHER): Payer: MEDICAID

## 2023-07-27 ENCOUNTER — Other Ambulatory Visit (INDEPENDENT_AMBULATORY_CARE_PROVIDER_SITE_OTHER): Payer: MEDICAID

## 2023-07-27 DIAGNOSIS — E782 Mixed hyperlipidemia: Secondary | ICD-10-CM

## 2023-07-27 DIAGNOSIS — I10 Essential (primary) hypertension: Secondary | ICD-10-CM

## 2023-07-28 LAB — CBC WITH DIFFERENTIAL/PLATELET

## 2023-07-28 LAB — CMP14+EGFR
ALT: 19 IU/L (ref 0–44)
AST: 22 IU/L (ref 0–40)
Albumin: 4.8 g/dL (ref 4.1–5.1)
Alkaline Phosphatase: 78 IU/L (ref 44–121)
BUN/Creatinine Ratio: 9 (ref 9–20)
BUN: 11 mg/dL (ref 6–20)
Bilirubin Total: 0.7 mg/dL (ref 0.0–1.2)
CO2: 16 mmol/L — ABNORMAL LOW (ref 20–29)
Calcium: 10.2 mg/dL (ref 8.7–10.2)
Chloride: 95 mmol/L — ABNORMAL LOW (ref 96–106)
Creatinine, Ser: 1.23 mg/dL (ref 0.76–1.27)
Globulin, Total: 2.9 g/dL (ref 1.5–4.5)
Glucose: 89 mg/dL (ref 70–99)
Potassium: 4 mmol/L (ref 3.5–5.2)
Sodium: 133 mmol/L — ABNORMAL LOW (ref 134–144)
Total Protein: 7.7 g/dL (ref 6.0–8.5)
eGFR: 77 mL/min/{1.73_m2} (ref >59)

## 2023-07-28 LAB — LIPID PANEL
Chol/HDL Ratio: 4.4 ratio (ref 0.0–5.0)
Cholesterol, Total: 189 mg/dL (ref 100–199)
HDL: 43 mg/dL (ref >39)
LDL Chol Calc (NIH): 125 mg/dL — ABNORMAL HIGH (ref 0–99)
Triglycerides: 114 mg/dL (ref 0–149)
VLDL Cholesterol Cal: 21 mg/dL (ref 5–40)

## 2023-07-28 LAB — HEMOGLOBIN A1C

## 2023-07-29 ENCOUNTER — Other Ambulatory Visit (INDEPENDENT_AMBULATORY_CARE_PROVIDER_SITE_OTHER): Payer: Self-pay | Admitting: Primary Care

## 2023-07-29 DIAGNOSIS — E782 Mixed hyperlipidemia: Secondary | ICD-10-CM

## 2023-07-29 MED ORDER — ROSUVASTATIN CALCIUM 20 MG PO TABS
20.0000 mg | ORAL_TABLET | Freq: Every day | ORAL | 1 refills | Status: AC
Start: 2023-07-29 — End: ?

## 2023-08-10 ENCOUNTER — Telehealth (INDEPENDENT_AMBULATORY_CARE_PROVIDER_SITE_OTHER): Payer: Self-pay | Admitting: Primary Care

## 2023-08-10 NOTE — Telephone Encounter (Signed)
 Called pt to confirm appt. Pt will be present.

## 2023-08-17 ENCOUNTER — Ambulatory Visit (INDEPENDENT_AMBULATORY_CARE_PROVIDER_SITE_OTHER): Payer: MEDICAID | Admitting: Primary Care

## 2023-08-27 ENCOUNTER — Ambulatory Visit: Payer: MEDICAID | Admitting: Physician Assistant

## 2023-09-09 ENCOUNTER — Telehealth (INDEPENDENT_AMBULATORY_CARE_PROVIDER_SITE_OTHER): Payer: Self-pay | Admitting: Primary Care

## 2023-09-09 NOTE — Telephone Encounter (Signed)
 Called pt to confirm appt. Pt did not answer and could not LVM for pt.

## 2023-09-10 ENCOUNTER — Ambulatory Visit (INDEPENDENT_AMBULATORY_CARE_PROVIDER_SITE_OTHER): Payer: MEDICAID | Admitting: Primary Care

## 2023-09-15 ENCOUNTER — Other Ambulatory Visit (INDEPENDENT_AMBULATORY_CARE_PROVIDER_SITE_OTHER): Payer: Self-pay | Admitting: Primary Care

## 2023-09-15 DIAGNOSIS — I10 Essential (primary) hypertension: Secondary | ICD-10-CM

## 2023-09-16 NOTE — Telephone Encounter (Signed)
 Requested medication (s) are due for refill today:   Yes  Requested medication (s) are on the active medication list:   Yes  Future visit scheduled:   No.   LOV 07/16/2022   Has multiple No Shows and cancellations.  Per notes where he has been contacted he says he will be at next appt but does not come.   Last ordered: Vitamin D3 not on list-expired;  Janumet  09/16/2022;   Jardiance  09/16/2022 #90, 0 refills;  hydrochlorothiazide  09/16/2022 #90, 0 refills  Unable to refill due to multiple No Shows and cancellations.     Requested Prescriptions  Pending Prescriptions Disp Refills   Cholecalciferol  (VITAMIN D3) 50 MCG (2000 UT) capsule [Pharmacy Med Name: Vitamin D3 50 MCG(2000 UT) CAPS] 90 capsule 1    Sig: TAKE 1 CAPSULE BY MOUTH DAILY     Endocrinology:  Vitamins - Vitamin D  Supplementation 2 Failed - 09/16/2023  3:45 PM      Failed - Manual Review: Route requests for 50,000 IU strength to the provider      Failed - Vitamin D  in normal range and within 360 days    Vit D, 25-Hydroxy  Date Value Ref Range Status  07/16/2022 41.7 30.0 - 100.0 ng/mL Final    Comment:    Vitamin D  deficiency has been defined by the Institute of Medicine and an Endocrine Society practice guideline as a level of serum 25-OH vitamin D  less than 20 ng/mL (1,2). The Endocrine Society went on to further define vitamin D  insufficiency as a level between 21 and 29 ng/mL (2). 1. IOM (Institute of Medicine). 2010. Dietary reference    intakes for calcium  and D. [place]: The    [employer]. 2. Holick MF, Binkley [place], Bischoff-Ferrari HA, et al.    Evaluation, treatment, and prevention of vitamin D     deficiency: an Endocrine Society clinical practice    guideline. JCEM. 2011 Jul; 96(7):1911-30.          Failed - Valid encounter within last 12 months    Recent Outpatient Visits           1 year ago Type 2 diabetes mellitus without complication, with long-term current use of insulin  (HCC)    [place] [facility] Marius Siemens, NP   1 year ago Vitamin D  deficiency   [place] [facility] Marius Siemens, NP   1 year ago Type 2 diabetes mellitus without complication, with long-term current use of insulin  [facility])   [place] [facility] Marius Siemens, NP   1 year ago Type 2 diabetes mellitus without complication, with long-term current use of insulin  (HCC)   [place] [facility] Marius Siemens, NP   2 years ago Type 2 diabetes mellitus without complication, with long-term current use of insulin  [facility])   [place] [facility] Marius Siemens, NP              Passed - Ca in normal range and within 360 days    Calcium   Date Value Ref Range Status  07/27/2023 10.2 8.7 - 10.2 mg/dL Final   Calcium , Ion  Date Value Ref Range Status  10/10/2016 1.52 (HH) 1.15 - 1.40 mmol/L Final          JANUMET  50-1000 MG tablet [Pharmacy Med Name: Janumet  50-1000MG  TABS] 180 tablet 0    Sig: TAKE 1 TABLET BY MOUTH TWICE A DAY WITH MEALS     Endocrinology:  Diabetes - Biguanide + DPP-4 Inhibitor Combos Failed - 09/16/2023  3:45 PM      Failed - HBA1C is between 0 and 7.9 and within 180 days    HbA1c, POC (controlled diabetic range)  Date Value Ref Range Status  07/16/2022 5.8 0.0 - 7.0 % Final   Hgb A1c MFr Bld  Date Value Ref Range Status  07/27/2023 CANCELED %     Comment:    Test not performed. No lavender top tube submitted.          Prediabetes: 5.7 - 6.4          Diabetes: >6.4          Glycemic control for adults with diabetes: <7.0  Result canceled by the ancillary.          Failed - B12 Level in normal range and within 720 days    No results found for: "VITAMINB12"       Failed - Valid encounter within last 6 months    Recent Outpatient Visits           1 year ago Type 2 diabetes mellitus without complication, with long-term current use of insulin  (HCC)    [place] [facility] Marius Siemens, NP   1 year ago Vitamin D  deficiency   [place] [facility] Marius Siemens, NP   1 year ago Type 2 diabetes mellitus without complication, with long-term current use of insulin  (HCC)   [place] [facility] Marius Siemens, NP   1 year ago Type 2 diabetes mellitus without complication, with long-term current use of insulin  (HCC)   [place] [facility] Marius Siemens, NP   2 years ago Type 2 diabetes mellitus without complication, with long-term current use of insulin  (HCC)   [place] [facility] Marius Siemens, NP              Passed - Cr in normal range and within 360 days    Creatinine, Ser  Date Value Ref Range Status  07/27/2023 1.23 0.76 - 1.27 mg/dL Final         Passed - eGFR in normal range and within 360 days    GFR calc Af Amer  Date Value Ref Range Status  12/20/2019 >60 >60 mL/min Final   GFR calc non Af Amer  Date Value Ref Range Status  12/20/2019 >60 >60 mL/min Final   eGFR  Date Value Ref Range Status  07/27/2023 77 >59 mL/min/1.73 Final         Passed - CBC within normal limits and completed in the last 12 months    WBC  Date Value Ref Range Status  07/27/2023 CANCELED x10E3/uL     Comment:    Test not performed. No lavender top tube submitted.  Result canceled by the ancillary.   12/20/2019 6.5 4.0 - 10.5 K/uL Final   RBC  Date Value Ref Range Status  07/27/2023 CANCELED      Comment:    Test not performed  Result canceled by the ancillary.   12/20/2019 4.94 4.22 - 5.81 MIL/uL Final   Hemoglobin  Date Value Ref Range Status  07/27/2023 CANCELED      Comment:    Test not performed  Result canceled by the ancillary.    Hematocrit  Date Value Ref Range Status  07/27/2023 CANCELED      Comment:    Test not performed  Result canceled by the  ancillary.    MCHC  Date Value Ref  Range Status  05/14/2021 35.4 31.5 - 35.7 g/dL Final  16/01/9603 54.0 30.0 - 36.0 g/dL Final   [facility]  Date Value Ref Range Status  05/14/2021 30.4 26.6 - 33.0 pg Final  12/20/2019 28.5 26.0 - 34.0 pg Final   MCV  Date Value Ref Range Status  05/14/2021 86 79 - 97 fL Final   No results found for: "PLTCOUNTKUC", "LABPLAT", "POCPLA" RDW  Date Value Ref Range Status  05/14/2021 12.8 11.6 - 15.4 % Final          JARDIANCE  10 MG TABS tablet [Pharmacy Med Name: Jardiance  10MG  TABS] 90 tablet 0    Sig: TAKE 1 TABLET BY MOUTH DAILY BEFORE BREAKFAST     Endocrinology:  Diabetes - SGLT2 Inhibitors Failed - 09/16/2023  3:45 PM      Failed - HBA1C is between 0 and 7.9 and within 180 days    HbA1c, POC (controlled diabetic range)  Date Value Ref Range Status  07/16/2022 5.8 0.0 - 7.0 % Final   Hgb A1c MFr Bld  Date Value Ref Range Status  07/27/2023 CANCELED %     Comment:    Test not performed. No lavender top tube submitted.          Prediabetes: 5.7 - 6.4          Diabetes: >6.4          Glycemic control for adults with diabetes: <7.0  Result canceled by the ancillary.          Failed - Valid encounter within last 6 months    Recent Outpatient Visits           1 year ago Type 2 diabetes mellitus without complication, with long-term current use of insulin  (HCC)   [place] [facility] Marius Siemens, NP   1 year ago Vitamin D  deficiency   [place] [facility] Marius Siemens, NP   1 year ago Type 2 diabetes mellitus without complication, with long-term current use of insulin  (HCC)   [place] [facility] Marius Siemens, NP   1 year ago Type 2 diabetes mellitus without complication, with long-term current use of insulin  (HCC)   [place] [facility] Marius Siemens, NP   2 years ago Type 2 diabetes mellitus without complication, with long-term current use of insulin  (HCC)   [place]  [facility] Marius Siemens, NP              Passed - Cr in normal range and within 360 days    Creatinine, Ser  Date Value Ref Range Status  07/27/2023 1.23 0.76 - 1.27 mg/dL Final         Passed - eGFR in normal range and within 360 days    GFR calc Af Amer  Date Value Ref Range Status  12/20/2019 >60 >60 mL/min Final   GFR calc non Af Amer  Date Value Ref Range Status  12/20/2019 >60 >60 mL/min Final   eGFR  Date Value Ref Range Status  07/27/2023 77 >59 mL/min/1.73 Final          hydrochlorothiazide  (HYDRODIURIL ) 25 MG tablet [Pharmacy Med Name: hydroCHLOROthiazide  25MG  TABS*] 90 tablet 0    Sig: TAKE 1 TABLET BY MOUTH DAILY     Cardiovascular: Diuretics - Thiazide Failed - 09/16/2023  3:45 PM      Failed - Na in normal range  and within 180 days    Sodium  Date Value Ref Range Status  07/27/2023 133 (L) 134 - 144 mmol/L Final         Failed - Valid encounter within last 6 months    Recent Outpatient Visits           1 year ago Type 2 diabetes mellitus without complication, with long-term current use of insulin  (HCC)   [place] [facility] Marius Siemens, NP   1 year ago Vitamin D  deficiency   [place] [facility] Marius Siemens, NP   1 year ago Type 2 diabetes mellitus without complication, with long-term current use of insulin  (HCC)   [place] [facility] Marius Siemens, NP   1 year ago Type 2 diabetes mellitus without complication, with long-term current use of insulin  (HCC)   [place] [facility] Marius Siemens, NP   2 years ago Type 2 diabetes mellitus without complication, with long-term current use of insulin  (HCC)   [place] [facility] Marius Siemens, NP              Passed - Cr in normal range and within 180 days    Creatinine, Ser  Date Value Ref Range Status  07/27/2023 1.23 0.76 - 1.27 mg/dL Final          Passed - K in normal range and within 180 days    Potassium  Date Value Ref Range Status  07/27/2023 4.0 3.5 - 5.2 mmol/L Final         Passed - Last BP in normal range    BP Readings from Last 1 Encounters:  07/16/22 133/88

## 2023-09-17 ENCOUNTER — Ambulatory Visit (INDEPENDENT_AMBULATORY_CARE_PROVIDER_SITE_OTHER): Payer: MEDICAID | Admitting: Primary Care

## 2023-10-01 ENCOUNTER — Ambulatory Visit (INDEPENDENT_AMBULATORY_CARE_PROVIDER_SITE_OTHER): Payer: MEDICAID | Admitting: Primary Care

## 2023-10-15 ENCOUNTER — Telehealth (INDEPENDENT_AMBULATORY_CARE_PROVIDER_SITE_OTHER): Payer: Self-pay | Admitting: Primary Care

## 2023-10-15 NOTE — Telephone Encounter (Signed)
 Spoke to pt about upcoming appt.. Will be present

## 2023-10-16 ENCOUNTER — Ambulatory Visit (INDEPENDENT_AMBULATORY_CARE_PROVIDER_SITE_OTHER): Payer: MEDICAID | Admitting: Primary Care

## 2023-10-16 ENCOUNTER — Other Ambulatory Visit (INDEPENDENT_AMBULATORY_CARE_PROVIDER_SITE_OTHER): Payer: Self-pay | Admitting: Primary Care

## 2023-10-16 DIAGNOSIS — E119 Type 2 diabetes mellitus without complications: Secondary | ICD-10-CM

## 2023-10-16 DIAGNOSIS — E0800 Diabetes mellitus due to underlying condition with hyperosmolarity without nonketotic hyperglycemic-hyperosmolar coma (NKHHC): Secondary | ICD-10-CM

## 2023-10-16 DIAGNOSIS — Z794 Long term (current) use of insulin: Secondary | ICD-10-CM

## 2023-10-16 NOTE — Telephone Encounter (Unsigned)
 Copied from CRM [id number]. Topic: Clinical - Medication Refill >> Oct 16, 2023 11:09 AM Bridgette Campus T wrote: Medication: Blood Glucose Monitoring Suppl (ACCU-CHEK AVIVA PLUS) w/Device KIT- Sensor Accu-Chek Softclix Lancets lancets glucose blood (ACCU-CHEK AVIVA PLUS) test strip  Has the patient contacted their pharmacy? No (Agent: If no, request that the patient contact the pharmacy for the refill. If patient does not wish to contact the pharmacy document the reason why and proceed with request.) (Agent: If yes, when and what did the pharmacy advise?)  This is the patient's preferred pharmacy:  [facility], [place] - [address] [place] [address] Phone: [phone] Fax: [phone]  Is this the correct pharmacy for this prescription? Yes If no, delete pharmacy and type the correct one.   Has the prescription been filled recently? Yes  Is the patient out of the medication? Yes  Has the patient been seen for an appointment in the last year OR does the patient have an upcoming appointment? Yes  Can we respond through MyChart? No  Agent: Please be advised that Rx refills may take up to 3 business days. We ask that you follow-up with your pharmacy.

## 2023-10-19 ENCOUNTER — Other Ambulatory Visit (INDEPENDENT_AMBULATORY_CARE_PROVIDER_SITE_OTHER): Payer: Self-pay | Admitting: Primary Care

## 2023-10-19 DIAGNOSIS — E782 Mixed hyperlipidemia: Secondary | ICD-10-CM

## 2023-10-19 DIAGNOSIS — I10 Essential (primary) hypertension: Secondary | ICD-10-CM

## 2023-10-19 NOTE — Telephone Encounter (Signed)
 Copied from CRM [id number]. Topic: Clinical - Medication Refill >> Oct 19, 2023  1:48 PM Delon HERO wrote: Medication: Patient is calling to request a new script to be sent for a ACCU-CHEK AVIVA PLUS Meter  Has the patient contacted their pharmacy? Yes (Agent: If no, request that the patient contact the pharmacy for the refill. If patient does not wish to contact the pharmacy document the reason why and proceed with request.) (Agent: If yes, when and what did the pharmacy advise?)  This is the patient's preferred pharmacy:  [facility], [place] - [address] [place] [address] Phone: [phone] Fax: [phone]  Is this the correct pharmacy for this prescription? Yes If no, delete pharmacy and type the correct one.   Has the prescription been filled recently? Yes  Is the patient out of the medication? No  Has the patient been seen for an appointment in the last year OR does the patient have an upcoming appointment? Yes  Can we respond through MyChart? Yes  Agent: Please be advised that Rx refills may take up to 3 business days. We ask that you follow-up with your pharmacy.

## 2023-10-19 NOTE — Telephone Encounter (Signed)
 Requested medication (s) are due for refill today: Yes  Requested medication (s) are on the active medication list: Yes  Last refill:    Future visit scheduled: Yes  Notes to clinic:  Accu-check device  - Historical provider.    Requested Prescriptions  Pending Prescriptions Disp Refills   Blood Glucose Monitoring Suppl (ACCU-CHEK AVIVA PLUS) w/Device KIT  1    Sig: See admin instructions.     Endocrinology: Diabetes - Testing Supplies Failed - 10/19/2023  3:19 PM      Failed - Valid encounter within last 12 months    Recent Outpatient Visits           1 year ago Type 2 diabetes mellitus without complication, with long-term current use of insulin  (HCC)   [place] [facility] Celestia Rosaline SQUIBB, NP   1 year ago Vitamin D  deficiency   [place] [facility] Celestia Rosaline SQUIBB, NP   1 year ago Type 2 diabetes mellitus without complication, with long-term current use of insulin  (HCC)   [place] [facility] Celestia Rosaline SQUIBB, NP   1 year ago Type 2 diabetes mellitus without complication, with long-term current use of insulin  (HCC)   [place] [facility] Celestia Rosaline SQUIBB, NP   2 years ago Type 2 diabetes mellitus without complication, with long-term current use of insulin  (HCC)   [place] [facility] Celestia Rosaline SQUIBB, NP               Accu-Chek Softclix Lancets lancets 100 each 3    Sig: USE AS DIRECTED THREE TIMES DAILY     Endocrinology: Diabetes - Testing Supplies Failed - 10/19/2023  3:19 PM      Failed - Valid encounter within last 12 months    Recent Outpatient Visits           1 year ago Type 2 diabetes mellitus without complication, with long-term current use of insulin  (HCC)   [place] [facility] Celestia Rosaline SQUIBB, NP   1 year ago Vitamin D  deficiency   [place] [facility] Celestia Rosaline SQUIBB, NP   1 year ago Type 2  diabetes mellitus without complication, with long-term current use of insulin  (HCC)   [place] [facility] Celestia Rosaline SQUIBB, NP   1 year ago Type 2 diabetes mellitus without complication, with long-term current use of insulin  (HCC)   [place] [facility] Celestia Rosaline SQUIBB, NP   2 years ago Type 2 diabetes mellitus without complication, with long-term current use of insulin  (HCC)   [place] [facility] Celestia Rosaline P, NP               glucose blood (ACCU-CHEK AVIVA PLUS) test strip 100 each 11    Sig: Use TID     Endocrinology: Diabetes - Testing Supplies Failed - 10/19/2023  3:19 PM      Failed - Valid encounter within last 12 months    Recent Outpatient Visits           1 year ago Type 2 diabetes mellitus without complication, with long-term current use of insulin  [facility])   [place] [facility] Celestia Rosaline SQUIBB, NP   1 year ago Vitamin D  deficiency   [place] [facility] Celestia Rosaline SQUIBB, NP   1 year ago Type 2 diabetes mellitus without complication, with long-term current use of insulin  [facility])   [place] [facility] Celestia Rosaline SQUIBB, NP   1 year ago Type 2 diabetes mellitus without complication, with long-term current use of insulin  [facility])   [place] [facility] Celestia Rosaline SQUIBB, NP   2 years ago Type 2 diabetes mellitus without complication, with long-term current use of insulin  [facility])   [place] [facility] Celestia Rosaline SQUIBB, NP

## 2023-10-19 NOTE — Telephone Encounter (Signed)
 Copied from CRM [id number]. Topic: Clinical - Medication Refill >> Oct 19, 2023  1:35 PM Rosaria E wrote: Medication:  hydrochlorothiazide  (HYDRODIURIL ) 25 MG tablet  Has the patient contacted their pharmacy? Yes (Agent: If no, request that the patient contact the pharmacy for the refill. If patient does not wish to contact the pharmacy document the reason why and proceed with request.) (Agent: If yes, when and what did the pharmacy advise?)  This is the patient's preferred pharmacy:  [facility], [place] - [address] [place] [address] Phone: [phone] Fax: [phone]  Is this the correct pharmacy for this prescription? Yes If no, delete pharmacy and type the correct one.   Has the prescription been filled recently? Yes  Is the patient out of the medication? Yes  Has the patient been seen for an appointment in the last year OR does the patient have an upcoming appointment? Yes  Can we respond through MyChart? Yes  Agent: Please be advised that Rx refills may take up to 3 business days. We ask that you follow-up with your pharmacy.

## 2023-10-19 NOTE — Telephone Encounter (Unsigned)
 Copied from CRM [id number]. Topic: Clinical - Medication Refill >> Oct 19, 2023  2:00 PM Essie A wrote: Medication: hydrochlorothiazide  (HYDRODIURIL ) 25 MG tablet; JANUMET  50-1000 MG tablet; JARDIANCE  10 MG TABS; rosuvastatin  (CRESTOR ) 20 MG tablet  Has the patient contacted their pharmacy? Yes (Agent: If no, request that the patient contact the pharmacy for the refill. If patient does not wish to contact the pharmacy document the reason why and proceed with request.) (Agent: If yes, when and what did the pharmacy advise?)  This is the patient's preferred pharmacy:  [facility], [place] - [address] [place] [address] Phone: [phone] Fax: [phone]  Is this the correct pharmacy for this prescription? Yes If no, delete pharmacy and type the correct one.   Has the prescription been filled recently? Yes  Is the patient out of the medication? Yes  Has the patient been seen for an appointment in the last year OR does the patient have an upcoming appointment? Yes  Can we respond through MyChart? No  Agent: Please be advised that Rx refills may take up to 3 business days. We ask that you follow-up with your pharmacy.

## 2023-10-19 NOTE — Telephone Encounter (Signed)
Franky Macho would you be able to help with this

## 2023-10-20 MED ORDER — ACCU-CHEK SOFTCLIX LANCETS MISC: 6 refills | Status: AC

## 2023-10-20 MED ORDER — ACCU-CHEK GUIDE W/DEVICE KIT: PACK | 0 refills | Status: AC

## 2023-10-20 MED ORDER — ACCU-CHEK GUIDE TEST VI STRP
ORAL_STRIP | 6 refills | Status: AC
Start: 2023-10-20 — End: ?

## 2023-10-21 MED ORDER — HYDROCHLOROTHIAZIDE 25 MG PO TABS: 25.0000 mg | ORAL_TABLET | Freq: Every day | ORAL | 0 refills | Status: DC

## 2023-10-21 NOTE — Telephone Encounter (Signed)
 Last OV 07/16/22, multiple no shows Hydrochlorothiazide - duplicate request filled 10/21/23 #90 Empagliflozin - no longer listed on current medication list Rosuvastatin - 08/21/23 #90 1RF Requested Prescriptions  Pending Prescriptions Disp Refills   sitaGLIPtin-metformin  (JANUMET ) 50-1000 MG tablet 180 tablet 0    Sig: Take 1 tablet by mouth 2 (two) times daily with a meal.     Endocrinology:  Diabetes - Biguanide + DPP-4 Inhibitor Combos Failed - 10/21/2023 10:28 AM      Failed - HBA1C is between 0 and 7.9 and within 180 days    HbA1c, POC (controlled diabetic range)  Date Value Ref Range Status  07/16/2022 5.8 0.0 - 7.0 % Final   Hgb A1c MFr Bld  Date Value Ref Range Status  07/27/2023 CANCELED %     Comment:    Test not performed. No lavender top tube submitted.          Prediabetes: 5.7 - 6.4          Diabetes: >6.4          Glycemic control for adults with diabetes: <7.0  Result canceled by the ancillary.          Failed - B12 Level in normal range and within 720 days    No results found for: VITAMINB12       Failed - Valid encounter within last 6 months    Recent Outpatient Visits           1 year ago Type 2 diabetes mellitus without complication, with long-term current use of insulin  (HCC)   [place] [facility] Jacob Rosaline SQUIBB, NP   1 year ago Vitamin D  deficiency   [place] [facility] Jacob Rosaline SQUIBB, NP   1 year ago Type 2 diabetes mellitus without complication, with long-term current use of insulin  (HCC)   [place] [facility] Jacob Rosaline SQUIBB, NP   1 year ago Type 2 diabetes mellitus without complication, with long-term current use of insulin  (HCC)   [place] [facility] Jacob Rosaline SQUIBB, NP   2 years ago Type 2 diabetes mellitus without complication, with long-term current use of insulin  [facility])   [place] [facility] Jacob Rosaline SQUIBB, NP               Passed - Cr in normal range and within 360 days    Creatinine, Ser  Date Value Ref Range Status  07/27/2023 1.23 0.76 - 1.27 mg/dL Final         Passed - eGFR in normal range and within 360 days    GFR calc Af Amer  Date Value Ref Range Status  12/20/2019 >60 >60 mL/min Final   GFR calc non Af Amer  Date Value Ref Range Status  12/20/2019 >60 >60 mL/min Final   eGFR  Date Value Ref Range Status  07/27/2023 77 >59 mL/min/1.73 Final         Passed - CBC within normal limits and completed in the last 12 months    WBC  Date Value Ref Range Status  07/27/2023 CANCELED x10E3/uL     Comment:    Test not performed. No lavender top tube submitted.  Result canceled by the ancillary.   12/20/2019 6.5 4.0 - 10.5 K/uL Final   RBC  Date Value Ref Range Status  07/27/2023 CANCELED      Comment:    Test not performed  Result canceled by the ancillary.   12/20/2019 4.94 4.22 - 5.81  MIL/uL Final   Hemoglobin  Date Value Ref Range Status  07/27/2023 CANCELED      Comment:    Test not performed  Result canceled by the ancillary.    Hematocrit  Date Value Ref Range Status  07/27/2023 CANCELED      Comment:    Test not performed  Result canceled by the ancillary.    MCHC  Date Value Ref Range Status  05/14/2021 35.4 31.5 - 35.7 g/dL Final  91/75/7978 66.8 30.0 - 36.0 g/dL Final   [facility]  Date Value Ref Range Status  05/14/2021 30.4 26.6 - 33.0 pg Final  12/20/2019 28.5 26.0 - 34.0 pg Final   MCV  Date Value Ref Range Status  05/14/2021 86 79 - 97 fL Final   No results found for: PLTCOUNTKUC, LABPLAT, POCPLA RDW  Date Value Ref Range Status  05/14/2021 12.8 11.6 - 15.4 % Final         Refused Prescriptions Disp Refills   hydrochlorothiazide  (HYDRODIURIL ) 25 MG tablet 90 tablet 0    Sig: Take 1 tablet (25 mg total) by mouth daily.     Cardiovascular: Diuretics - Thiazide Failed - 10/21/2023 10:28 AM      Failed - Na in normal range and within 180  days    Sodium  Date Value Ref Range Status  07/27/2023 133 (L) 134 - 144 mmol/L Final         Failed - Valid encounter within last 6 months    Recent Outpatient Visits           1 year ago Type 2 diabetes mellitus without complication, with long-term current use of insulin  (HCC)   [place] [facility] Jacob Rosaline SQUIBB, NP   1 year ago Vitamin D  deficiency   [place] [facility] Jacob Rosaline SQUIBB, NP   1 year ago Type 2 diabetes mellitus without complication, with long-term current use of insulin  (HCC)   [place] [facility] Jacob Rosaline SQUIBB, NP   1 year ago Type 2 diabetes mellitus without complication, with long-term current use of insulin  (HCC)   [place] [facility] Jacob Rosaline SQUIBB, NP   2 years ago Type 2 diabetes mellitus without complication, with long-term current use of insulin  (HCC)   [place] [facility] Jacob Rosaline SQUIBB, NP              Passed - Cr in normal range and within 180 days    Creatinine, Ser  Date Value Ref Range Status  07/27/2023 1.23 0.76 - 1.27 mg/dL Final         Passed - K in normal range and within 180 days    Potassium  Date Value Ref Range Status  07/27/2023 4.0 3.5 - 5.2 mmol/L Final         Passed - Last BP in normal range    BP Readings from Last 1 Encounters:  07/16/22 133/88          empagliflozin  (JARDIANCE ) 10 MG TABS tablet 90 tablet 0    Sig: Take 1 tablet (10 mg total) by mouth daily before breakfast.     Endocrinology:  Diabetes - SGLT2 Inhibitors Failed - 10/21/2023 10:28 AM      Failed - HBA1C is between 0 and 7.9 and within 180 days    HbA1c, POC (controlled diabetic range)  Date Value Ref Range Status  07/16/2022 5.8 0.0 - 7.0 % Final   Hgb A1c MFr  Bld  Date Value Ref Range Status  07/27/2023 CANCELED %     Comment:    Test not performed. No lavender top tube submitted.          Prediabetes: 5.7 - 6.4           Diabetes: >6.4          Glycemic control for adults with diabetes: <7.0  Result canceled by the ancillary.          Failed - Valid encounter within last 6 months    Recent Outpatient Visits           1 year ago Type 2 diabetes mellitus without complication, with long-term current use of insulin  (HCC)   [place] [facility] Jacob Rosaline SQUIBB, NP   1 year ago Vitamin D  deficiency   [place] [facility] Jacob Rosaline SQUIBB, NP   1 year ago Type 2 diabetes mellitus without complication, with long-term current use of insulin  (HCC)   [place] [facility] Jacob Rosaline SQUIBB, NP   1 year ago Type 2 diabetes mellitus without complication, with long-term current use of insulin  (HCC)   [place] [facility] Jacob Rosaline SQUIBB, NP   2 years ago Type 2 diabetes mellitus without complication, with long-term current use of insulin  (HCC)   [place] [facility] Jacob Rosaline SQUIBB, NP              Passed - Cr in normal range and within 360 days    Creatinine, Ser  Date Value Ref Range Status  07/27/2023 1.23 0.76 - 1.27 mg/dL Final         Passed - eGFR in normal range and within 360 days    GFR calc Af Amer  Date Value Ref Range Status  12/20/2019 >60 >60 mL/min Final   GFR calc non Af Amer  Date Value Ref Range Status  12/20/2019 >60 >60 mL/min Final   eGFR  Date Value Ref Range Status  07/27/2023 77 >59 mL/min/1.73 Final          rosuvastatin  (CRESTOR ) 20 MG tablet 90 tablet 1    Sig: Take 1 tablet (20 mg total) by mouth daily.     Cardiovascular:  Antilipid - Statins 2 Failed - 10/21/2023 10:28 AM      Failed - Valid encounter within last 12 months    Recent Outpatient Visits           1 year ago Type 2 diabetes mellitus without complication, with long-term current use of insulin  (HCC)   [place] [facility] Jacob Rosaline SQUIBB, NP   1 year ago  Vitamin D  deficiency   [place] [facility] Jacob Rosaline SQUIBB, NP   1 year ago Type 2 diabetes mellitus without complication, with long-term current use of insulin  (HCC)   [place] [facility] Jacob Rosaline SQUIBB, NP   1 year ago Type 2 diabetes mellitus without complication, with long-term current use of insulin  (HCC)   [place] [facility] Jacob Rosaline SQUIBB, NP   2 years ago Type 2 diabetes mellitus without complication, with long-term current use of insulin  [facility])   [place] [facility] Jacob Rosaline SQUIBB, NP              Failed - Lipid Panel in normal range within the last 12 months    Cholesterol, Total  Date Value Ref Range Status  07/27/2023 189  100 - 199 mg/dL Final   LDL Chol Calc (NIH)  Date Value Ref Range Status  07/27/2023 125 (H) 0 - 99 mg/dL Final   HDL  Date Value Ref Range Status  07/27/2023 43 >39 mg/dL Final   Triglycerides  Date Value Ref Range Status  07/27/2023 114 0 - 149 mg/dL Final         Passed - Cr in normal range and within 360 days    Creatinine, Ser  Date Value Ref Range Status  07/27/2023 1.23 0.76 - 1.27 mg/dL Final         Passed - Patient is not pregnant

## 2023-10-21 NOTE — Telephone Encounter (Signed)
 Requested medication (s) are due for refill today -yes  Requested medication (s) are on the active medication list -yes  Future visit scheduled -yes 11/05/23  Last refill: 09/16/22  Notes to clinic: fails visit protocol- multiple no show, last OV 07/16/22  Requested Prescriptions  Pending Prescriptions Disp Refills   sitaGLIPtin-metformin  (JANUMET ) 50-1000 MG tablet 180 tablet 0    Sig: Take 1 tablet by mouth 2 (two) times daily with a meal.     Endocrinology:  Diabetes - Biguanide + DPP-4 Inhibitor Combos Failed - 10/21/2023 10:29 AM      Failed - HBA1C is between 0 and 7.9 and within 180 days    HbA1c, POC (controlled diabetic range)  Date Value Ref Range Status  07/16/2022 5.8 0.0 - 7.0 % Final   Hgb A1c MFr Bld  Date Value Ref Range Status  07/27/2023 CANCELED %     Comment:    Test not performed. No lavender top tube submitted.          Prediabetes: 5.7 - 6.4          Diabetes: >6.4          Glycemic control for adults with diabetes: <7.0  Result canceled by the ancillary.          Failed - B12 Level in normal range and within 720 days    No results found for: VITAMINB12       Failed - Valid encounter within last 6 months    Recent Outpatient Visits           1 year ago Type 2 diabetes mellitus without complication, with long-term current use of insulin  (HCC)   [place] [facility] Celestia Rosaline SQUIBB, NP   1 year ago Vitamin D  deficiency   [place] [facility] Celestia Rosaline SQUIBB, NP   1 year ago Type 2 diabetes mellitus without complication, with long-term current use of insulin  (HCC)   [place] [facility] Celestia Rosaline SQUIBB, NP   1 year ago Type 2 diabetes mellitus without complication, with long-term current use of insulin  (HCC)   [place] [facility] Celestia Rosaline SQUIBB, NP   2 years ago Type 2 diabetes mellitus without complication, with long-term current use of insulin  [facility])    [place] [facility] Celestia Rosaline SQUIBB, NP              Passed - Cr in normal range and within 360 days    Creatinine, Ser  Date Value Ref Range Status  07/27/2023 1.23 0.76 - 1.27 mg/dL Final         Passed - eGFR in normal range and within 360 days    GFR calc Af Amer  Date Value Ref Range Status  12/20/2019 >60 >60 mL/min Final   GFR calc non Af Amer  Date Value Ref Range Status  12/20/2019 >60 >60 mL/min Final   eGFR  Date Value Ref Range Status  07/27/2023 77 >59 mL/min/1.73 Final         Passed - CBC within normal limits and completed in the last 12 months    WBC  Date Value Ref Range Status  07/27/2023 CANCELED x10E3/uL     Comment:    Test not performed. No lavender top tube submitted.  Result canceled by the ancillary.   12/20/2019 6.5 4.0 - 10.5 K/uL Final   RBC  Date Value Ref Range Status  07/27/2023 CANCELED  Comment:    Test not performed  Result canceled by the ancillary.   12/20/2019 4.94 4.22 - 5.81 MIL/uL Final   Hemoglobin  Date Value Ref Range Status  07/27/2023 CANCELED      Comment:    Test not performed  Result canceled by the ancillary.    Hematocrit  Date Value Ref Range Status  07/27/2023 CANCELED      Comment:    Test not performed  Result canceled by the ancillary.    MCHC  Date Value Ref Range Status  05/14/2021 35.4 31.5 - 35.7 g/dL Final  91/75/7978 66.8 30.0 - 36.0 g/dL Final   [facility]  Date Value Ref Range Status  05/14/2021 30.4 26.6 - 33.0 pg Final  12/20/2019 28.5 26.0 - 34.0 pg Final   MCV  Date Value Ref Range Status  05/14/2021 86 79 - 97 fL Final   No results found for: PLTCOUNTKUC, LABPLAT, POCPLA RDW  Date Value Ref Range Status  05/14/2021 12.8 11.6 - 15.4 % Final         Refused Prescriptions Disp Refills   hydrochlorothiazide  (HYDRODIURIL ) 25 MG tablet 90 tablet 0    Sig: Take 1 tablet (25 mg total) by mouth daily.     Cardiovascular: Diuretics - Thiazide  Failed - 10/21/2023 10:29 AM      Failed - Na in normal range and within 180 days    Sodium  Date Value Ref Range Status  07/27/2023 133 (L) 134 - 144 mmol/L Final         Failed - Valid encounter within last 6 months    Recent Outpatient Visits           1 year ago Type 2 diabetes mellitus without complication, with long-term current use of insulin  (HCC)   [place] [facility] Celestia Rosaline SQUIBB, NP   1 year ago Vitamin D  deficiency   [place] [facility] Celestia Rosaline SQUIBB, NP   1 year ago Type 2 diabetes mellitus without complication, with long-term current use of insulin  (HCC)   [place] [facility] Celestia Rosaline SQUIBB, NP   1 year ago Type 2 diabetes mellitus without complication, with long-term current use of insulin  (HCC)   [place] [facility] Celestia Rosaline SQUIBB, NP   2 years ago Type 2 diabetes mellitus without complication, with long-term current use of insulin  (HCC)    [facility] Celestia Rosaline SQUIBB, NP              Passed - Cr in normal range and within 180 days    Creatinine, Ser  Date Value Ref Range Status  07/27/2023 1.23 0.76 - 1.27 mg/dL Final         Passed - K in normal range and within 180 days    Potassium  Date Value Ref Range Status  07/27/2023 4.0 3.5 - 5.2 mmol/L Final         Passed - Last BP in normal range    BP Readings from Last 1 Encounters:  07/16/22 133/88          empagliflozin  (JARDIANCE ) 10 MG TABS tablet 90 tablet 0    Sig: Take 1 tablet (10 mg total) by mouth daily before breakfast.     Endocrinology:  Diabetes - SGLT2 Inhibitors Failed - 10/21/2023 10:29 AM      Failed - HBA1C is between 0 and 7.9 and within 180 days    HbA1c, POC (controlled diabetic  range)  Date Value Ref Range Status  07/16/2022 5.8 0.0 - 7.0 % Final   Hgb A1c MFr Bld  Date Value Ref Range Status  07/27/2023 CANCELED %     Comment:    Test not  performed. No lavender top tube submitted.          Prediabetes: 5.7 - 6.4          Diabetes: >6.4          Glycemic control for adults with diabetes: <7.0  Result canceled by the ancillary.          Failed - Valid encounter within last 6 months    Recent Outpatient Visits           1 year ago Type 2 diabetes mellitus without complication, with long-term current use of insulin  (HCC)   [place] [facility] Celestia Rosaline SQUIBB, NP   1 year ago Vitamin D  deficiency   [place] [facility] Celestia Rosaline SQUIBB, NP   1 year ago Type 2 diabetes mellitus without complication, with long-term current use of insulin  (HCC)   [place] [facility] Celestia Rosaline SQUIBB, NP   1 year ago Type 2 diabetes mellitus without complication, with long-term current use of insulin  (HCC)   [place] [facility] Celestia Rosaline SQUIBB, NP   2 years ago Type 2 diabetes mellitus without complication, with long-term current use of insulin  (HCC)   [place] [facility] Celestia Rosaline SQUIBB, NP              Passed - Cr in normal range and within 360 days    Creatinine, Ser  Date Value Ref Range Status  07/27/2023 1.23 0.76 - 1.27 mg/dL Final         Passed - eGFR in normal range and within 360 days    GFR calc Af Amer  Date Value Ref Range Status  12/20/2019 >60 >60 mL/min Final   GFR calc non Af Amer  Date Value Ref Range Status  12/20/2019 >60 >60 mL/min Final   eGFR  Date Value Ref Range Status  07/27/2023 77 >59 mL/min/1.73 Final          rosuvastatin  (CRESTOR ) 20 MG tablet 90 tablet 1    Sig: Take 1 tablet (20 mg total) by mouth daily.     Cardiovascular:  Antilipid - Statins 2 Failed - 10/21/2023 10:29 AM      Failed - Valid encounter within last 12 months    Recent Outpatient Visits           1 year ago Type 2 diabetes mellitus without complication, with long-term current use of insulin  (HCC)   [facility] Celestia Rosaline SQUIBB, NP   1 year ago Vitamin D  deficiency   [place] [facility] Celestia Rosaline SQUIBB, NP   1 year ago Type 2 diabetes mellitus without complication, with long-term current use of insulin  (HCC)   [place] [facility] Celestia Rosaline SQUIBB, NP   1 year ago Type 2 diabetes mellitus without complication, with long-term current use of insulin  (HCC)   [place] [facility] Celestia Rosaline SQUIBB, NP   2 years ago Type 2 diabetes mellitus without complication, with long-term current use of insulin  [facility])   [place] [facility] Celestia Rosaline SQUIBB, NP              Failed - Lipid Panel in normal  range within the last 12 months    Cholesterol, Total  Date Value Ref Range Status  07/27/2023 189 100 - 199 mg/dL Final   LDL Chol Calc (NIH)  Date Value Ref Range Status  07/27/2023 125 (H) 0 - 99 mg/dL Final   HDL  Date Value Ref Range Status  07/27/2023 43 >39 mg/dL Final   Triglycerides  Date Value Ref Range Status  07/27/2023 114 0 - 149 mg/dL Final         Passed - Cr in normal range and within 360 days    Creatinine, Ser  Date Value Ref Range Status  07/27/2023 1.23 0.76 - 1.27 mg/dL Final         Passed - Patient is not pregnant         Requested Prescriptions  Pending Prescriptions Disp Refills   sitaGLIPtin-metformin  (JANUMET ) 50-1000 MG tablet 180 tablet 0    Sig: Take 1 tablet by mouth 2 (two) times daily with a meal.     Endocrinology:  Diabetes - Biguanide + DPP-4 Inhibitor Combos Failed - 10/21/2023 10:29 AM      Failed - HBA1C is between 0 and 7.9 and within 180 days    HbA1c, POC (controlled diabetic range)  Date Value Ref Range Status  07/16/2022 5.8 0.0 - 7.0 % Final   Hgb A1c MFr Bld  Date Value Ref Range Status  07/27/2023 CANCELED %     Comment:    Test not performed. No lavender top tube submitted.          Prediabetes: 5.7 - 6.4           Diabetes: >6.4          Glycemic control for adults with diabetes: <7.0  Result canceled by the ancillary.          Failed - B12 Level in normal range and within 720 days    No results found for: VITAMINB12       Failed - Valid encounter within last 6 months    Recent Outpatient Visits           1 year ago Type 2 diabetes mellitus without complication, with long-term current use of insulin  [facility])   [place] [facility] Celestia Rosaline SQUIBB, NP   1 year ago Vitamin D  deficiency   [place] [facility] Celestia Rosaline SQUIBB, NP   1 year ago Type 2 diabetes mellitus without complication, with long-term current use of insulin  (HCC)   [place] [facility] Celestia Rosaline SQUIBB, NP   1 year ago Type 2 diabetes mellitus without complication, with long-term current use of insulin  (HCC)   [place] [facility] Celestia Rosaline SQUIBB, NP   2 years ago Type 2 diabetes mellitus without complication, with long-term current use of insulin  [facility])   [place] [facility] Celestia Rosaline SQUIBB, NP              Passed - Cr in normal range and within 360 days    Creatinine, Ser  Date Value Ref Range Status  07/27/2023 1.23 0.76 - 1.27 mg/dL Final         Passed - eGFR in normal range and within 360 days    GFR calc Af Amer  Date Value Ref Range Status  12/20/2019 >60 >60 mL/min Final   GFR calc non Af Amer  Date Value Ref Range Status  12/20/2019 >60 >60 mL/min Final   eGFR  Date  Value Ref Range Status  07/27/2023 77 >59 mL/min/1.73 Final         Passed - CBC within normal limits and completed in the last 12 months    WBC  Date Value Ref Range Status  07/27/2023 CANCELED x10E3/uL     Comment:    Test not performed. No lavender top tube submitted.  Result canceled by the ancillary.   12/20/2019 6.5 4.0 - 10.5 K/uL Final   RBC  Date Value Ref Range Status  07/27/2023 CANCELED       Comment:    Test not performed  Result canceled by the ancillary.   12/20/2019 4.94 4.22 - 5.81 MIL/uL Final   Hemoglobin  Date Value Ref Range Status  07/27/2023 CANCELED      Comment:    Test not performed  Result canceled by the ancillary.    Hematocrit  Date Value Ref Range Status  07/27/2023 CANCELED      Comment:    Test not performed  Result canceled by the ancillary.    MCHC  Date Value Ref Range Status  05/14/2021 35.4 31.5 - 35.7 g/dL Final  91/75/7978 66.8 30.0 - 36.0 g/dL Final   [facility]  Date Value Ref Range Status  05/14/2021 30.4 26.6 - 33.0 pg Final  12/20/2019 28.5 26.0 - 34.0 pg Final   MCV  Date Value Ref Range Status  05/14/2021 86 79 - 97 fL Final   No results found for: PLTCOUNTKUC, LABPLAT, POCPLA RDW  Date Value Ref Range Status  05/14/2021 12.8 11.6 - 15.4 % Final         Refused Prescriptions Disp Refills   hydrochlorothiazide  (HYDRODIURIL ) 25 MG tablet 90 tablet 0    Sig: Take 1 tablet (25 mg total) by mouth daily.     Cardiovascular: Diuretics - Thiazide Failed - 10/21/2023 10:29 AM      Failed - Na in normal range and within 180 days    Sodium  Date Value Ref Range Status  07/27/2023 133 (L) 134 - 144 mmol/L Final         Failed - Valid encounter within last 6 months    Recent Outpatient Visits           1 year ago Type 2 diabetes mellitus without complication, with long-term current use of insulin  (HCC)   [place] [facility] Celestia Rosaline SQUIBB, NP   1 year ago Vitamin D  deficiency   [place] [facility] Celestia Rosaline SQUIBB, NP   1 year ago Type 2 diabetes mellitus without complication, with long-term current use of insulin  (HCC)   [place] [facility] Celestia Rosaline SQUIBB, NP   1 year ago Type 2 diabetes mellitus without complication, with long-term current use of insulin  (HCC)   [place] [facility] Celestia Rosaline SQUIBB, NP   2 years ago  Type 2 diabetes mellitus without complication, with long-term current use of insulin  [facility])   [place] [facility] Celestia Rosaline SQUIBB, NP              Passed - Cr in normal range and within 180 days    Creatinine, Ser  Date Value Ref Range Status  07/27/2023 1.23 0.76 - 1.27 mg/dL Final         Passed - K in normal range and within 180 days    Potassium  Date Value Ref Range Status  07/27/2023 4.0 3.5 - 5.2 mmol/L Final  Passed - Last BP in normal range    BP Readings from Last 1 Encounters:  07/16/22 133/88          empagliflozin  (JARDIANCE ) 10 MG TABS tablet 90 tablet 0    Sig: Take 1 tablet (10 mg total) by mouth daily before breakfast.     Endocrinology:  Diabetes - SGLT2 Inhibitors Failed - 10/21/2023 10:29 AM      Failed - HBA1C is between 0 and 7.9 and within 180 days    HbA1c, POC (controlled diabetic range)  Date Value Ref Range Status  07/16/2022 5.8 0.0 - 7.0 % Final   Hgb A1c MFr Bld  Date Value Ref Range Status  07/27/2023 CANCELED %     Comment:    Test not performed. No lavender top tube submitted.          Prediabetes: 5.7 - 6.4          Diabetes: >6.4          Glycemic control for adults with diabetes: <7.0  Result canceled by the ancillary.          Failed - Valid encounter within last 6 months    Recent Outpatient Visits           1 year ago Type 2 diabetes mellitus without complication, with long-term current use of insulin  (HCC)   [place] [facility] Celestia Rosaline SQUIBB, NP   1 year ago Vitamin D  deficiency   [place] [facility] Celestia Rosaline SQUIBB, NP   1 year ago Type 2 diabetes mellitus without complication, with long-term current use of insulin  (HCC)   [place] [facility] Celestia Rosaline SQUIBB, NP   1 year ago Type 2 diabetes mellitus without complication, with long-term current use of insulin  (HCC)   [place] [facility] Celestia Rosaline SQUIBB, NP   2 years ago Type 2 diabetes mellitus without complication, with long-term current use of insulin  (HCC)   [place] [facility] Celestia Rosaline SQUIBB, NP              Passed - Cr in normal range and within 360 days    Creatinine, Ser  Date Value Ref Range Status  07/27/2023 1.23 0.76 - 1.27 mg/dL Final         Passed - eGFR in normal range and within 360 days    GFR calc Af Amer  Date Value Ref Range Status  12/20/2019 >60 >60 mL/min Final   GFR calc non Af Amer  Date Value Ref Range Status  12/20/2019 >60 >60 mL/min Final   eGFR  Date Value Ref Range Status  07/27/2023 77 >59 mL/min/1.73 Final          rosuvastatin  (CRESTOR ) 20 MG tablet 90 tablet 1    Sig: Take 1 tablet (20 mg total) by mouth daily.     Cardiovascular:  Antilipid - Statins 2 Failed - 10/21/2023 10:29 AM      Failed - Valid encounter within last 12 months    Recent Outpatient Visits           1 year ago Type 2 diabetes mellitus without complication, with long-term current use of insulin  [facility])   [place] [facility] Celestia Rosaline SQUIBB, NP   1 year ago Vitamin D  deficiency   [place] [facility] Celestia Rosaline SQUIBB, NP   1 year ago Type 2 diabetes mellitus without complication, with long-term current use of  insulin  (HCC)   [place] [facility] Celestia Rosaline SQUIBB, NP   1 year ago Type 2 diabetes mellitus without complication, with long-term current use of insulin  [facility])   [place] [facility] Celestia Rosaline SQUIBB, NP   2 years ago Type 2 diabetes mellitus without complication, with long-term current use of insulin  (HCC)   [place] [facility] Celestia Rosaline SQUIBB, NP              Failed - Lipid Panel in normal range within the last 12 months    Cholesterol, Total  Date Value Ref Range Status  07/27/2023 189 100 - 199 mg/dL Final   LDL Chol Calc (NIH)  Date Value Ref  Range Status  07/27/2023 125 (H) 0 - 99 mg/dL Final   HDL  Date Value Ref Range Status  07/27/2023 43 >39 mg/dL Final   Triglycerides  Date Value Ref Range Status  07/27/2023 114 0 - 149 mg/dL Final         Passed - Cr in normal range and within 360 days    Creatinine, Ser  Date Value Ref Range Status  07/27/2023 1.23 0.76 - 1.27 mg/dL Final         Passed - Patient is not pregnant

## 2023-10-21 NOTE — Telephone Encounter (Signed)
 Will forward to provider

## 2023-10-21 NOTE — Telephone Encounter (Signed)
 Requested Prescriptions  Pending Prescriptions Disp Refills   hydrochlorothiazide  (HYDRODIURIL ) 25 MG tablet 90 tablet 0    Sig: Take 1 tablet (25 mg total) by mouth daily.     Cardiovascular: Diuretics - Thiazide Failed - 10/21/2023  9:37 AM      Failed - Na in normal range and within 180 days    Sodium  Date Value Ref Range Status  07/27/2023 133 (L) 134 - 144 mmol/L Final         Failed - Valid encounter within last 6 months    Recent Outpatient Visits           1 year ago Type 2 diabetes mellitus without complication, with long-term current use of insulin  (HCC)   [place] [facility] Celestia Rosaline SQUIBB, NP   1 year ago Vitamin D  deficiency   [place] [facility] Celestia Rosaline SQUIBB, NP   1 year ago Type 2 diabetes mellitus without complication, with long-term current use of insulin  (HCC)   [place] [facility] Celestia Rosaline SQUIBB, NP   1 year ago Type 2 diabetes mellitus without complication, with long-term current use of insulin  (HCC)   [place] [facility] Celestia Rosaline SQUIBB, NP   2 years ago Type 2 diabetes mellitus without complication, with long-term current use of insulin  (HCC)   [place] [facility] Celestia Rosaline SQUIBB, NP              Passed - Cr in normal range and within 180 days    Creatinine, Ser  Date Value Ref Range Status  07/27/2023 1.23 0.76 - 1.27 mg/dL Final         Passed - K in normal range and within 180 days    Potassium  Date Value Ref Range Status  07/27/2023 4.0 3.5 - 5.2 mmol/L Final         Passed - Last BP in normal range    BP Readings from Last 1 Encounters:  07/16/22 133/88

## 2023-10-21 NOTE — Telephone Encounter (Signed)
 Requested medication (s) are due for refill today: Yes  Requested medication (s) are on the active medication list: Yes  Last refill:  01/15/17  Future visit scheduled: Yes  Notes to clinic:  Unable to refill per protocol, last refill by another provider. Patient requesting a different meter from what was sent in on yesterday, new Rx needed.     Requested Prescriptions  Pending Prescriptions Disp Refills   Blood Glucose Monitoring Suppl (ACCU-CHEK AVIVA PLUS) w/Device KIT  1    Sig: See admin instructions.     Endocrinology: Diabetes - Testing Supplies Failed - 10/21/2023  9:46 AM      Failed - Valid encounter within last 12 months    Recent Outpatient Visits           1 year ago Type 2 diabetes mellitus without complication, with long-term current use of insulin  (HCC)   [place] [facility] Celestia Rosaline SQUIBB, NP   1 year ago Vitamin D  deficiency   [place] [facility] Celestia Rosaline SQUIBB, NP   1 year ago Type 2 diabetes mellitus without complication, with long-term current use of insulin  (HCC)   [place] [facility] Celestia Rosaline SQUIBB, NP   1 year ago Type 2 diabetes mellitus without complication, with long-term current use of insulin  [facility])   [place] [facility] Celestia Rosaline SQUIBB, NP   2 years ago Type 2 diabetes mellitus without complication, with long-term current use of insulin  [facility])   [place] [facility] Celestia Rosaline SQUIBB, NP

## 2023-10-21 NOTE — Telephone Encounter (Signed)
 Herlene would you be able to assit with sending supplies in

## 2023-11-05 ENCOUNTER — Telehealth: Payer: Self-pay

## 2023-11-05 ENCOUNTER — Encounter (INDEPENDENT_AMBULATORY_CARE_PROVIDER_SITE_OTHER): Payer: Self-pay | Admitting: Primary Care

## 2023-11-05 ENCOUNTER — Ambulatory Visit (INDEPENDENT_AMBULATORY_CARE_PROVIDER_SITE_OTHER): Payer: MEDICAID | Admitting: Primary Care

## 2023-11-05 VITALS — BP 138/84 | HR 122 | Resp 16 | Ht 71.0 in | Wt 237.6 lb

## 2023-11-05 DIAGNOSIS — Z794 Long term (current) use of insulin: Secondary | ICD-10-CM | POA: Diagnosis not present

## 2023-11-05 DIAGNOSIS — E119 Type 2 diabetes mellitus without complications: Secondary | ICD-10-CM

## 2023-11-05 DIAGNOSIS — I10 Essential (primary) hypertension: Secondary | ICD-10-CM | POA: Diagnosis not present

## 2023-11-05 LAB — POCT GLYCOSYLATED HEMOGLOBIN (HGB A1C): HbA1c, POC (controlled diabetic range): 5.9 % (ref 0.0–7.0)

## 2023-11-05 NOTE — Telephone Encounter (Signed)
 I spoke to the patient while he was with Rosaline Bohr, NP at his appointment today. Rosaline told him how pleased she was that he was at his appointment today as he has recently no showed for appointments with her.    He explained that he is trying to work on some issues that he has to deal with but he would not tell is what those issues were. He just kept saying that he needs to work on some things.  He denied that he was being hurt/ threatened by anyone and he denied any thoughts of self harm. Again, he said he just need to work on some things.   I provided him with my phone number and told him that he can call if he wants to talk any further about what he is dealing with and we can see if there is anything we can do to support him and he was very appreciative.

## 2023-11-06 LAB — MICROALBUMIN / CREATININE URINE RATIO
Creatinine, Urine: 26 mg/dL
Microalb/Creat Ratio: <12 mg/g{creat} (ref 0–29)
Microalbumin, Urine: <3 ug/mL

## 2023-11-09 NOTE — Progress Notes (Signed)
 [facility]  Jacob Price, is a 38 y.o. male  RDW:[id number]  FMW:[id number]  DOB - [birth year]  Chief Complaint  Patient presents with   Diabetes   Hypertension    Pt states he just took his bp medication       Subjective:   Jacob Price is a 38 y.o. male here today for a follow up visit. Htn Patient has No headache, No chest pain, No abdominal pain - No Nausea, No new weakness tingling or numbness, No Cough - shortness of breath. T2D-Denies polyuria, polydipsia, polyphasia or vision changes.  Does not check blood sugars at home. Concerned about patient fidgety , stuttering no direct eye contact question about anyone bothering him at his living place - he stays in his room no social able (change). Questioned if this behavior was different asked could I call clinical nurse manager stated yes . Jacob Price began to ask a series of questions delayed on answering . We discussed Theresa and PCP) this was diffently different behavior asked for our numbers if needed to take and information was given. Denied auditory or visual hallucination , harming self or others .SABRA... Diabetes  Hypertension    No problems updated.  Comprehensive ROS Pertinent positive and negative noted in HPI   No Known Allergies  Past Medical History:  Diagnosis Date   Asperger's syndrome    Diabetes mellitus without complication (HCC)    Hypertension     Current Outpatient Medications on File Prior to Visit  Medication Sig Dispense Refill   Accu-Chek Softclix Lancets lancets Use to check blood sugar 3 times daily. 100 each 6   benztropine  (COGENTIN ) 0.5 MG tablet Take 0.5 mg by mouth 2 (two) times daily.     blood glucose meter kit and supplies Dispense based on patient and insurance preference. Use up to four times daily as directed. (FOR ICD-10 E10.9, E11.9). 1 each 0   Blood Glucose Monitoring Suppl (ACCU-CHEK AVIVA PLUS) w/Device KIT See admin instructions.  1   Blood Glucose Monitoring  Suppl (ACCU-CHEK GUIDE) w/Device KIT Use to check blood sugar 3 times daily. 1 kit 0   Cholecalciferol  (VITAMIN D3) 50 MCG (2000 UT) CAPS Take 1 capsule (2,000 Units total) by mouth daily. 90 capsule 1   glucose blood (ACCU-CHEK GUIDE TEST) test strip Use to check blood sugar 3 times daily. 100 each 6   hydrochlorothiazide  (HYDRODIURIL ) 25 MG tablet Take 1 tablet (25 mg total) by mouth daily. 90 tablet 0   hydrOXYzine (ATARAX) 25 MG tablet TAKE 1 TABLET BY MOUTH DAILY 90 tablet 1   Insulin  Pen Needle 29G X 12.7MM MISC Insulin  administration 5  times daily 150 each 11   JANUMET  50-1000 MG tablet TAKE 1 TABLET BY MOUTH TWICE A DAY WITH MEALS 180 tablet 0   JARDIANCE  10 MG TABS tablet TAKE 1 TABLET BY MOUTH DAILY BEFORE BREAKFAST 90 tablet 0   Lancets Misc. (ACCU-CHEK FASTCLIX LANCET) KIT Use as directed three times daily 1 kit 0   risperiDONE  (RISPERDAL ) 2 MG tablet Take 2 mg by mouth 3 (three) times daily.     risperiDONE  microspheres (RISPERDAL  CONSTA) 25 MG injection Inject 25 mg into the muscle every 14 (fourteen) days.     rosuvastatin  (CRESTOR ) 20 MG tablet Take 1 tablet (20 mg total) by mouth daily. 90 tablet 1   No current facility-administered medications on file prior to visit.   Health Maintenance  Topic Date Due   Hepatitis B Vaccine (1 of 3 - 19+  3-dose series) Never done   HPV Vaccine (1 - 3-dose SCDM series) Never done   Eye exam for diabetics  06/09/2020   Complete foot exam   11/02/2022   COVID-19 Vaccine (1 - 2024-25 season) Never done   Flu Shot  11/27/2023   Hemoglobin A1C  05/07/2024   Yearly kidney function blood test for diabetes  07/26/2024   Yearly kidney health urinalysis for diabetes  11/04/2024   DTaP/Tdap/Td vaccine (2 - Td or Tdap) 01/30/2027   Pneumococcal Vaccination  Completed   Hepatitis C Screening  Completed   HIV Screening  Completed   Meningitis B Vaccine  Aged Out    Objective:   Vitals:   11/05/23 1514 11/05/23 1515  BP: (!) 145/106 138/84   Pulse: (!) 122   Resp: 16   SpO2: 99%   Weight: 237 lb 9.6 oz (107.8 kg)   Height: 5' 11 (1.803 m)    BP Readings from Last 3 Encounters:  11/05/23 138/84  07/16/22 133/88  04/07/22 112/82      Physical Exam Vitals reviewed.  Constitutional:      Appearance: He is obese.  HENT:     Head: Normocephalic.     Right Ear: Tympanic membrane and external ear normal.     Left Ear: Tympanic membrane and external ear normal.     Nose: Nose normal.  Eyes:     Extraocular Movements: Extraocular movements intact.     Pupils: Pupils are equal, round, and reactive to light.  Cardiovascular:     Rate and Rhythm: Normal rate and regular rhythm.  Pulmonary:     Effort: Pulmonary effort is normal.     Breath sounds: Normal breath sounds.  Abdominal:     General: Bowel sounds are normal. There is distension.     Palpations: Abdomen is soft.  Musculoskeletal:        General: Normal range of motion.  Skin:    General: Skin is warm and dry.  Neurological:     Mental Status: He is oriented to person, place, and time.  Psychiatric:        Mood and Affect: Mood normal.     Comments: Behavior different        Assessment & Plan  Jacob Price was seen today for diabetes and hypertension.  Diagnoses and all orders for this visit:  Type 2 diabetes mellitus without complication, with long-term current use of insulin  (HCC) -     POCT glycosylated hemoglobin (Hb A1C) -     Microalbumin / creatinine urine ratio  Hypertension, unspecified type Slightly elevated - BP goal - < 130/80 Explained that having normal blood pressure is the goal and medications are helping to get to goal and maintain normal blood pressure. DIET: Limit salt intake, read nutrition labels to check salt content, limit fried and high fatty foods  Avoid using multisymptom OTC cold preparations that generally contain sudafed which can rise BP. Consult with pharmacist on best cold relief products to use for persons with  HTN EXERCISE Discussed incorporating exercise such as walking - 30 minutes most days of the week and can do in 10 minute intervals        Patient have been counseled extensively about nutrition and exercise. Other issues discussed during this visit include: low cholesterol diet, weight control and daily exercise, foot care, annual eye examinations at Ophthalmology, importance of adherence with medications and regular follow-up. We also discussed long term complications of uncontrolled diabetes and hypertension.  2  weeks  The patient was given clear instructions to go to ER or return to medical center if symptoms don't improve, worsen or new problems develop. The patient verbalized understanding. The patient was told to call to get lab results if they haven't heard anything in the next week.   This note has been created with Education officer, environmental. Any transcriptional errors are unintentional.   Rosaline SHAUNNA Bohr, NP 11/09/2023, 1:11 AM

## 2023-11-19 ENCOUNTER — Ambulatory Visit (INDEPENDENT_AMBULATORY_CARE_PROVIDER_SITE_OTHER): Payer: MEDICAID | Admitting: Primary Care

## 2023-12-03 ENCOUNTER — Ambulatory Visit (INDEPENDENT_AMBULATORY_CARE_PROVIDER_SITE_OTHER): Payer: MEDICAID | Admitting: Primary Care

## 2023-12-15 ENCOUNTER — Ambulatory Visit (INDEPENDENT_AMBULATORY_CARE_PROVIDER_SITE_OTHER): Payer: MEDICAID | Admitting: Primary Care

## 2023-12-23 ENCOUNTER — Other Ambulatory Visit (INDEPENDENT_AMBULATORY_CARE_PROVIDER_SITE_OTHER): Payer: Self-pay | Admitting: Primary Care

## 2023-12-23 DIAGNOSIS — I10 Essential (primary) hypertension: Secondary | ICD-10-CM

## 2023-12-30 ENCOUNTER — Telehealth (INDEPENDENT_AMBULATORY_CARE_PROVIDER_SITE_OTHER): Payer: Self-pay | Admitting: Primary Care

## 2023-12-30 NOTE — Telephone Encounter (Signed)
 Called pt to confirm appt. Pt will be present.

## 2023-12-30 NOTE — Telephone Encounter (Signed)
 Called pt top confirm ppt. Pt will be present.

## 2023-12-31 ENCOUNTER — Encounter (INDEPENDENT_AMBULATORY_CARE_PROVIDER_SITE_OTHER): Payer: Self-pay | Admitting: Primary Care

## 2023-12-31 ENCOUNTER — Ambulatory Visit (INDEPENDENT_AMBULATORY_CARE_PROVIDER_SITE_OTHER): Payer: MEDICAID | Admitting: Primary Care

## 2023-12-31 VITALS — BP 141/88 | HR 130 | Resp 19 | Ht 71.0 in | Wt 238.6 lb

## 2023-12-31 DIAGNOSIS — E119 Type 2 diabetes mellitus without complications: Secondary | ICD-10-CM | POA: Diagnosis not present

## 2023-12-31 DIAGNOSIS — Z794 Long term (current) use of insulin: Secondary | ICD-10-CM | POA: Diagnosis not present

## 2023-12-31 DIAGNOSIS — I10 Essential (primary) hypertension: Secondary | ICD-10-CM

## 2023-12-31 DIAGNOSIS — Z23 Encounter for immunization: Secondary | ICD-10-CM

## 2023-12-31 NOTE — Progress Notes (Signed)
 [facility]   Jacob Price is a 38 y.o. male presents for hypertension evaluation, Denies shortness of breath, headaches, chest pain or lower extremity edema, sudden onset, vision changes, unilateral weakness, dizziness, paresthesias   Patient reports adherence with medications. ( Problem with getting medication was out) Explain was not his fault but if unaware of issue nothing can be done) Spoke with clinical nurse manager while at his appt. Seeing her makes him happy and feel special and he is.   Dietary habits include: loves chocolate - responsible for cooking own meals asked would he like to go to a nutrition class and learn healthy cooking and eating habits- decline at this time. Exercise habits include:walks  Family / Social history: unknown    Past Medical History:  Diagnosis Date   Asperger's syndrome    Diabetes mellitus without complication (HCC)    Hypertension    History reviewed. No pertinent surgical history. No Known Allergies Current Outpatient Medications on File Prior to Visit  Medication Sig Dispense Refill   Accu-Chek Softclix Lancets lancets Use to check blood sugar 3 times daily. 100 each 6   benztropine  (COGENTIN ) 0.5 MG tablet Take 0.5 mg by mouth 2 (two) times daily.     blood glucose meter kit and supplies Dispense based on patient and insurance preference. Use up to four times daily as directed. (FOR ICD-10 E10.9, E11.9). 1 each 0   Blood Glucose Monitoring Suppl (ACCU-CHEK AVIVA PLUS) w/Device KIT See admin instructions.  1   Blood Glucose Monitoring Suppl (ACCU-CHEK GUIDE) w/Device KIT Use to check blood sugar 3 times daily. 1 kit 0   Cholecalciferol  (VITAMIN D3) 50 MCG (2000 UT) CAPS Take 1 capsule (2,000 Units total) by mouth daily. 90 capsule 1   glucose blood (ACCU-CHEK GUIDE TEST) test strip Use to check blood sugar 3 times daily. 100 each 6   hydrochlorothiazide  (HYDRODIURIL ) 25 MG tablet TAKE 1 TABLET (25 MG TOTAL) BY MOUTH  DAILY. 90 tablet 0   hydrOXYzine (ATARAX) 25 MG tablet TAKE 1 TABLET BY MOUTH DAILY 90 tablet 1   Insulin  Pen Needle 29G X 12.7MM MISC Insulin  administration 5  times daily 150 each 11   JANUMET  50-1000 MG tablet TAKE 1 TABLET BY MOUTH TWICE A DAY WITH MEALS 180 tablet 0   JARDIANCE  10 MG TABS tablet TAKE 1 TABLET BY MOUTH DAILY BEFORE BREAKFAST 90 tablet 0   Lancets Misc. (ACCU-CHEK FASTCLIX LANCET) KIT Use as directed three times daily 1 kit 0   risperiDONE  (RISPERDAL ) 2 MG tablet Take 2 mg by mouth 3 (three) times daily.     risperiDONE  microspheres (RISPERDAL  CONSTA) 25 MG injection Inject 25 mg into the muscle every 14 (fourteen) days.     rosuvastatin  (CRESTOR ) 20 MG tablet Take 1 tablet (20 mg total) by mouth daily. 90 tablet 1   No current facility-administered medications on file prior to visit.   Social History   Socioeconomic History   Marital status: Single    Spouse name: Not on file   Number of children: Not on file   Years of education: Not on file   Highest education level: Not on file  Occupational History   Not on file  Tobacco Use   Smoking status: Never   Smokeless tobacco: Never  Vaping Use   Vaping status: Never Used  Substance and Sexual Activity   Alcohol use: No   Drug use: No   Sexual activity: Not Currently  Other Topics Concern  Not on file  Social History Narrative   ** Merged History Encounter **       Social Drivers of Corporate investment banker Strain: Not on file  Food Insecurity: Not on file  Transportation Needs: Not on file  Physical Activity: Not on file  Stress: Not on file  Social Connections: Not on file  Intimate Partner Violence: Not on file   History reviewed. No pertinent family history. Health Maintenance  Topic Date Due   Hepatitis B Vaccines 19-59 Average Risk (1 of 3 - 19+ 3-dose series) Never done   HPV VACCINES (1 - 3-dose SCDM series) Never done   OPHTHALMOLOGY EXAM  06/09/2020   FOOT EXAM  11/02/2022    COVID-19 Vaccine (1 - 2024-25 season) Never done   HEMOGLOBIN A1C  05/07/2024   Diabetic kidney evaluation - eGFR measurement  07/26/2024   Diabetic kidney evaluation - Urine ACR  11/04/2024   DTaP/Tdap/Td (2 - Td or Tdap) 01/30/2027   Pneumococcal Vaccine  Completed   Influenza Vaccine  Completed   Hepatitis C Screening  Completed   HIV Screening  Completed   Meningococcal B Vaccine  Aged Out     OBJECTIVE:  Vitals:   12/31/23 1446  BP: (!) 141/88  Pulse: (!) 130  Resp: 19  SpO2: 100%  Weight: 238 lb 9.6 oz (108.2 kg)  Height: 5' 11 (1.803 m)    Physical Exam Vitals reviewed.  Constitutional:      Appearance: He is obese.  HENT:     Head: Normocephalic.     Right Ear: Tympanic membrane, ear canal and external ear normal.     Left Ear: Tympanic membrane, ear canal and external ear normal.     Nose: Nose normal.  Eyes:     Extraocular Movements: Extraocular movements intact.     Pupils: Pupils are equal, round, and reactive to light.  Cardiovascular:     Rate and Rhythm: Normal rate and regular rhythm.  Pulmonary:     Effort: Pulmonary effort is normal.     Breath sounds: Normal breath sounds.  Abdominal:     General: Bowel sounds are normal. There is distension.     Palpations: Abdomen is soft.  Musculoskeletal:        General: Normal range of motion.  Skin:    General: Skin is warm and dry.  Neurological:     Mental Status: He is oriented to person, place, and time. Mental status is at baseline.  Psychiatric:        Mood and Affect: Mood normal.        Behavior: Behavior normal.      ROS  Last 3 Office BP readings: BP Readings from Last 3 Encounters:  12/31/23 (!) 141/88  11/05/23 138/84  07/16/22 133/88    BMET    Component Value Date/Time   NA 133 (L) 07/27/2023 1340   K 4.0 07/27/2023 1340   CL 95 (L) 07/27/2023 1340   CO2 16 (L) 07/27/2023 1340   GLUCOSE 89 07/27/2023 1340   GLUCOSE 96 12/20/2019 1645   BUN 11 07/27/2023 1340    CREATININE 1.23 07/27/2023 1340   CALCIUM  10.2 07/27/2023 1340   GFRNONAA >60 12/20/2019 1645   GFRAA >60 12/20/2019 1645    Renal function: CrCl cannot be calculated (Patient's most recent lab result is older than the maximum 21 days allowed.).  Clinical ASCVD: Yes  The ASCVD Risk score (Arnett DK, et al., 2019) failed to calculate for the  following reasons:   The 2019 ASCVD risk score is only valid for ages 76 to 41  ASCVD risk factors include- [place]   ASSESSMENT & PLAN:  Diagnoses and all orders for this visit:  Need for immunization against influenza -     Flu vaccine trivalent PF, 6mos and older(Flulaval,Afluria,Fluarix,Fluzone)  Type 2 diabetes mellitus without complication, with long-term current use of insulin  (HCC) - educated on lifestyle modifications, including but not limited to diet choices and adding exercise to daily routine.    Hypertension, unspecified type -Counseled on lifestyle modifications for blood pressure control including reduced dietary sodium, increased exercise, weight reduction and adequate sleep. Also, educated patient about the risk for cardiovascular events, stroke and heart attack. Also counseled patient about the importance of medication adherence. If you participate in smoking, it is important to stop using tobacco as this will increase the risks associated with uncontrolled blood pressure.  Minimize salt intake. Minimize alcohol intake Medication will be package and sent to his home     This note has been created with Education officer, environmental. Any transcriptional errors are unintentional.   Jacob SHAUNNA Bohr, NP 01/03/2024, 11:09 PM

## 2024-01-03 ENCOUNTER — Encounter (INDEPENDENT_AMBULATORY_CARE_PROVIDER_SITE_OTHER): Payer: Self-pay | Admitting: Primary Care

## 2024-01-18 ENCOUNTER — Ambulatory Visit (INDEPENDENT_AMBULATORY_CARE_PROVIDER_SITE_OTHER): Payer: MEDICAID | Admitting: Primary Care

## 2024-02-03 ENCOUNTER — Ambulatory Visit (INDEPENDENT_AMBULATORY_CARE_PROVIDER_SITE_OTHER): Payer: MEDICAID | Admitting: Primary Care

## 2024-02-24 ENCOUNTER — Ambulatory Visit (INDEPENDENT_AMBULATORY_CARE_PROVIDER_SITE_OTHER): Payer: MEDICAID | Admitting: Primary Care

## 2024-03-02 ENCOUNTER — Ambulatory Visit (INDEPENDENT_AMBULATORY_CARE_PROVIDER_SITE_OTHER): Payer: MEDICAID | Admitting: Primary Care

## 2024-03-23 ENCOUNTER — Ambulatory Visit (INDEPENDENT_AMBULATORY_CARE_PROVIDER_SITE_OTHER): Payer: MEDICAID | Admitting: Primary Care

## 2024-03-29 ENCOUNTER — Other Ambulatory Visit (INDEPENDENT_AMBULATORY_CARE_PROVIDER_SITE_OTHER): Payer: Self-pay | Admitting: Primary Care

## 2024-03-29 DIAGNOSIS — I10 Essential (primary) hypertension: Secondary | ICD-10-CM

## 2024-03-29 NOTE — Telephone Encounter (Signed)
 Will forward to provider

## 2024-04-11 ENCOUNTER — Ambulatory Visit (INDEPENDENT_AMBULATORY_CARE_PROVIDER_SITE_OTHER): Payer: MEDICAID | Admitting: Primary Care

## 2024-05-04 ENCOUNTER — Telehealth (INDEPENDENT_AMBULATORY_CARE_PROVIDER_SITE_OTHER): Payer: Self-pay | Admitting: Primary Care

## 2024-05-04 NOTE — Telephone Encounter (Signed)
 Called pt to confirm appt. Pt will not be present.

## 2024-05-05 ENCOUNTER — Ambulatory Visit (INDEPENDENT_AMBULATORY_CARE_PROVIDER_SITE_OTHER): Payer: Self-pay | Admitting: Primary Care

## 2024-05-09 ENCOUNTER — Telehealth (INDEPENDENT_AMBULATORY_CARE_PROVIDER_SITE_OTHER): Payer: Self-pay | Admitting: Primary Care

## 2024-05-09 NOTE — Telephone Encounter (Signed)
 Spoke to pt about upcoming appt.. Will be present

## 2024-05-10 ENCOUNTER — Ambulatory Visit (INDEPENDENT_AMBULATORY_CARE_PROVIDER_SITE_OTHER): Payer: MEDICAID | Admitting: Primary Care

## 2024-05-24 ENCOUNTER — Telehealth (INDEPENDENT_AMBULATORY_CARE_PROVIDER_SITE_OTHER): Payer: Self-pay | Admitting: Primary Care

## 2024-05-24 NOTE — Telephone Encounter (Signed)
 Called to confirm appt. Pt did not answer and could not LVM. Please advise.

## 2024-05-25 ENCOUNTER — Ambulatory Visit (INDEPENDENT_AMBULATORY_CARE_PROVIDER_SITE_OTHER): Payer: MEDICAID | Admitting: Primary Care

## 2024-06-15 ENCOUNTER — Ambulatory Visit (INDEPENDENT_AMBULATORY_CARE_PROVIDER_SITE_OTHER): Payer: MEDICAID | Admitting: Primary Care
